# Patient Record
Sex: Female | Born: 1980
Health system: Southern US, Community
[De-identification: ages and names within clinical notes are randomized; demographics above are authoritative.]

## PROBLEM LIST (undated history)

## (undated) DIAGNOSIS — T7840XA Allergy, unspecified, initial encounter: Secondary | ICD-10-CM

## (undated) DIAGNOSIS — D649 Anemia, unspecified: Secondary | ICD-10-CM

## (undated) DIAGNOSIS — E079 Disorder of thyroid, unspecified: Secondary | ICD-10-CM

## (undated) HISTORY — PX: CHOLECYSTECTOMY: SHX55

## (undated) HISTORY — DX: Disorder of thyroid, unspecified: E07.9

## (undated) HISTORY — PX: COLPOSCOPY: SHX161

## (undated) HISTORY — DX: Allergy, unspecified, initial encounter: T78.40XA

## (undated) HISTORY — DX: Anemia, unspecified: D64.9

---

## 2010-01-07 ENCOUNTER — Emergency Department (HOSPITAL_COMMUNITY): Admission: EM | Admit: 2010-01-07 | Discharge: 2010-01-07 | Payer: Self-pay | Admitting: Emergency Medicine

## 2011-05-23 ENCOUNTER — Other Ambulatory Visit: Payer: Self-pay | Admitting: Family Medicine

## 2011-05-23 ENCOUNTER — Ambulatory Visit
Admission: RE | Admit: 2011-05-23 | Discharge: 2011-05-23 | Disposition: A | Discharge status: Home / Self Care | Payer: 59 | Source: Ambulatory Visit | Attending: Family Medicine | Admitting: Family Medicine

## 2011-05-23 DIAGNOSIS — R1011 Right upper quadrant pain: Secondary | ICD-10-CM

## 2011-12-03 ENCOUNTER — Ambulatory Visit (INDEPENDENT_AMBULATORY_CARE_PROVIDER_SITE_OTHER): Payer: 59

## 2011-12-03 DIAGNOSIS — J069 Acute upper respiratory infection, unspecified: Secondary | ICD-10-CM

## 2011-12-03 DIAGNOSIS — R5081 Fever presenting with conditions classified elsewhere: Secondary | ICD-10-CM

## 2011-12-03 DIAGNOSIS — R05 Cough: Secondary | ICD-10-CM

## 2011-12-03 DIAGNOSIS — R059 Cough, unspecified: Secondary | ICD-10-CM

## 2011-12-07 ENCOUNTER — Ambulatory Visit (INDEPENDENT_AMBULATORY_CARE_PROVIDER_SITE_OTHER): Payer: 59

## 2011-12-07 DIAGNOSIS — R05 Cough: Secondary | ICD-10-CM

## 2011-12-07 DIAGNOSIS — R059 Cough, unspecified: Secondary | ICD-10-CM

## 2011-12-07 DIAGNOSIS — J069 Acute upper respiratory infection, unspecified: Secondary | ICD-10-CM

## 2011-12-07 DIAGNOSIS — K089 Disorder of teeth and supporting structures, unspecified: Secondary | ICD-10-CM

## 2012-01-29 ENCOUNTER — Ambulatory Visit (INDEPENDENT_AMBULATORY_CARE_PROVIDER_SITE_OTHER): Payer: 59 | Admitting: Physician Assistant

## 2012-01-29 VITALS — BP 125/83 | HR 71 | Temp 97.9°F | Resp 16 | Ht 69.0 in | Wt 320.8 lb

## 2012-01-29 DIAGNOSIS — R1084 Generalized abdominal pain: Secondary | ICD-10-CM

## 2012-01-29 DIAGNOSIS — R1013 Epigastric pain: Secondary | ICD-10-CM

## 2012-01-29 LAB — COMPREHENSIVE METABOLIC PANEL
ALT: 14 U/L (ref 0–35)
Albumin: 4 g/dL (ref 3.5–5.2)
CO2: 25 mEq/L (ref 19–32)
Calcium: 9.2 mg/dL (ref 8.4–10.5)
Chloride: 106 mEq/L (ref 96–112)
Creat: 0.76 mg/dL (ref 0.50–1.10)
Potassium: 4.5 mEq/L (ref 3.5–5.3)
Sodium: 138 mEq/L (ref 135–145)
Total Protein: 6.7 g/dL (ref 6.0–8.3)

## 2012-01-29 LAB — POCT CBC
Granulocyte percent: 68.7 %G (ref 37–80)
Hemoglobin: 13.3 g/dL (ref 12.2–16.2)
MCH, POC: 28.4 pg (ref 27–31.2)
MPV: 10.2 fL (ref 0–99.8)
POC MID %: 6 %M (ref 0–12)
RBC: 4.69 M/uL (ref 4.04–5.48)
WBC: 8.2 10*3/uL (ref 4.6–10.2)

## 2012-01-29 MED ORDER — OMEPRAZOLE 20 MG PO CPDR: 20.0000 mg | DELAYED_RELEASE_CAPSULE | Freq: Every day | ORAL | Status: DC

## 2012-01-29 NOTE — Progress Notes (Signed)
  Subjective:    Patient ID: Cassandra Levy, female    DOB: 1981/09/23, 30 y.o.   MRN: [id number]  HPI Cassandra Levy presents today c/o waking at 3 am with epigastric pain and burning.  Pain relieved slightly with lying on stomach and taking Pepto. No radiating pain. Some belching. No n/v/d. Last BM was 4 pm yesterday and was normal. Has had similar episodes in past that were mild.  Abdominal US 6/12 for RUQ pain.  Fatty liver and no stones.  LFTs were normal then.  Pain seems to be gone at the moment but she feels that it would return if she ate something. Did have fatty meal last pm and needed Pepto after eating for heartburn symptoms.  This is unusual for pt. Denies UTI sx.  LMP 01/28/12  Hypothyroidism Non smoker  Review of Systems  Constitutional: Negative for fever, chills and appetite change.  Respiratory: Negative for cough, chest tightness and shortness of breath.   Cardiovascular: Negative for chest pain and palpitations.  Gastrointestinal: Positive for abdominal pain. Negative for nausea, vomiting, diarrhea and constipation.  Genitourinary: Negative for dysuria, frequency, hematuria and flank pain.  Neurological: Negative for headaches.       Objective:   Physical Exam  Cardiovascular: Normal rate and regular rhythm.   Pulmonary/Chest: Effort normal and breath sounds normal.  Abdominal: Soft. Normal appearance. There is tenderness (mild tenderness epigastric area. ) in the epigastric area. There is CVA tenderness and positive Murphy's sign. There is no rebound, no guarding and no tenderness at McBurney's point.   Results for orders placed in visit on 01/29/12  POCT CBC      Component Value Range   WBC 8.2  4.6 - 10.2 (K/uL)   Lymph, poc 2.1  0.6 - 3.4    POC LYMPH PERCENT 25.3  10 - 50 (%L)   MID (cbc) 0.5  0 - 0.9    POC MID % 6.0  0 - 12 (%M)   POC Granulocyte 5.6  2 - 6.9    Granulocyte percent 68.7  37 - 80 (%G)   RBC 4.69  4.04 - 5.48 (M/uL)   Hemoglobin 13.3  12.2 -  16.2 (g/dL)   HCT, POC 40.9  81.1 - 47.9 (%)   MCV 89.2  80 - 97 (fL)   MCH, POC 28.4  27 - 31.2 (pg)   MCHC 31.8  31.8 - 35.4 (g/dL)   RDW, POC 91.4     Platelet Count, POC 274  142 - 424 (K/uL)   MPV 10.2  0 - 99.8 (fL)          Assessment & Plan:  Epigastric pain ?GERD vs. GB  Check CMET Omerprazole 20 mg ordered qd Bland diet. Avoid fatty food. Return if symptoms worsen.

## 2012-03-30 ENCOUNTER — Other Ambulatory Visit: Payer: Self-pay | Admitting: Family Medicine

## 2012-03-30 NOTE — Telephone Encounter (Signed)
Needs ov

## 2012-05-15 ENCOUNTER — Other Ambulatory Visit: Payer: Self-pay | Admitting: Physician Assistant

## 2012-06-27 ENCOUNTER — Ambulatory Visit (INDEPENDENT_AMBULATORY_CARE_PROVIDER_SITE_OTHER): Payer: 59 | Admitting: Family Medicine

## 2012-06-27 ENCOUNTER — Ambulatory Visit: Payer: 59

## 2012-06-27 VITALS — BP 150/82 | HR 92 | Temp 97.8°F | Resp 18 | Ht 69.0 in | Wt 320.0 lb

## 2012-06-27 DIAGNOSIS — R109 Unspecified abdominal pain: Secondary | ICD-10-CM

## 2012-06-27 DIAGNOSIS — E039 Hypothyroidism, unspecified: Secondary | ICD-10-CM

## 2012-06-27 DIAGNOSIS — N760 Acute vaginitis: Secondary | ICD-10-CM

## 2012-06-27 DIAGNOSIS — E049 Nontoxic goiter, unspecified: Secondary | ICD-10-CM

## 2012-06-27 DIAGNOSIS — K5792 Diverticulitis of intestine, part unspecified, without perforation or abscess without bleeding: Secondary | ICD-10-CM

## 2012-06-27 DIAGNOSIS — K5900 Constipation, unspecified: Secondary | ICD-10-CM | POA: Insufficient documentation

## 2012-06-27 LAB — POCT WET PREP WITH KOH
KOH Prep POC: POSITIVE
Trichomonas, UA: NEGATIVE
Yeast Wet Prep HPF POC: NEGATIVE

## 2012-06-27 LAB — POCT CBC
Granulocyte percent: 73.5 %G (ref 37–80)
Hemoglobin: 13.6 g/dL (ref 12.2–16.2)
MCH, POC: 28.3 pg (ref 27–31.2)
MCV: 92.8 fL (ref 80–97)
RBC: 4.8 M/uL (ref 4.04–5.48)
WBC: 12.8 10*3/uL — AB (ref 4.6–10.2)

## 2012-06-27 LAB — POCT UA - MICROSCOPIC ONLY: Crystals, Ur, HPF, POC: NEGATIVE

## 2012-06-27 LAB — POCT URINALYSIS DIPSTICK
Bilirubin, UA: NEGATIVE
Blood, UA: NEGATIVE
Glucose, UA: NEGATIVE
Nitrite, UA: NEGATIVE
Spec Grav, UA: 1.015

## 2012-06-27 MED ORDER — CIPROFLOXACIN HCL 500 MG PO TABS: 500.0000 mg | ORAL_TABLET | Freq: Two times a day (BID) | ORAL | Status: AC

## 2012-06-27 MED ORDER — METRONIDAZOLE 500 MG PO TABS: 500.0000 mg | ORAL_TABLET | Freq: Two times a day (BID) | ORAL | Status: AC

## 2012-06-27 MED ORDER — METRONIDAZOLE 500 MG PO TABS: 500.0000 mg | ORAL_TABLET | Freq: Three times a day (TID) | ORAL | Status: DC

## 2012-06-27 MED ORDER — LEVOTHYROXINE SODIUM 50 MCG PO TABS: 50.0000 ug | ORAL_TABLET | Freq: Every day | ORAL | Status: DC

## 2012-06-27 NOTE — Progress Notes (Signed)
Subjective: 31 year old lady with history of vaginal discharge off and on. He's been having been under lately. Some itching. She is planning on working harder on losing weight to the gym. She says that she has a family history of legs have a lot of fluid in them.  she is hypothyroid and needs her thyroid rechecked. It has been 3 years since she's had a Pap smear. Will recheck it today.She her husband have not had intercourse since May. Her last missed period was the end of June. It was elected usual.Last night she had sharp left lower quadrant abdominal pain. Again she had this morning and is persistently tender in the left lower quadrant. General bowel movement today. She usually has normal bowel movements.  Dr. Patsy Lager is her PCP.  Objective: Obese white female in no acute distress. Neck supple. Modest thyromegaly. It is about 2-3 times normal in size. Chest is clear. Heart regular without murmurs. And soft without mass or tenderness except for some tenderness in the left lower quadrant. Extremities without edema. Pelvic normal external genitalia. Vaginal mucosa unremarkable. Cervix benign. It is a little hard to get a good view of the source because of her vagina is so deep. Was taken, partially blindly. Bimanual exam shows no adnexal uterine masses  Results for orders placed in visit on 06/27/12  POCT CBC      Component Value Range   WBC 12.8 (*) 4.6 - 10.2 K/uL   Lymph, poc 2.6  0.6 - 3.4   POC LYMPH PERCENT 20.3  10 - 50 %L   MID (cbc) 0.8  0 - 0.9   POC MID % 6.2  0 - 12 %M   POC Granulocyte 9.4 (*) 2 - 6.9   Granulocyte percent 73.5  37 - 80 %G   RBC 4.80  4.04 - 5.48 M/uL   Hemoglobin 13.6  12.2 - 16.2 g/dL   HCT, POC 40.9  81.1 - 47.9 %   MCV 92.8  80 - 97 fL   MCH, POC 28.3  27 - 31.2 pg   MCHC 30.6 (*) 31.8 - 35.4 g/dL   RDW, POC 91.4     Platelet Count, POC 336  142 - 424 K/uL   MPV 9.2  0 - 99.8 fL  POCT URINALYSIS DIPSTICK      Component Value Range   Color, UA yellow     Clarity, UA cloudy     Glucose, UA negative     Bilirubin, UA negative     Ketones, UA negative     Spec Grav, UA 1.015     Blood, UA negative     pH, UA 8.5     Protein, UA negative     Urobilinogen, UA 0.2     Nitrite, UA negative     Leukocytes, UA Trace    POCT UA - MICROSCOPIC ONLY      Component Value Range   WBC, Ur, HPF, POC 0-2     RBC, urine, microscopic 0-1     Bacteria, U Microscopic trace     Mucus, UA negative     Epithelial cells, urine per micros 3-6     Crystals, Ur, HPF, POC negative     Casts, Ur, LPF, POC negative     Yeast, UA negative     Amorphous positive    POCT WET PREP WITH KOH      Component Value Range   Trichomonas, UA Negative     Clue Cells Wet Prep  HPF POC 0-1     Epithelial Wet Prep HPF POC 2-5     Yeast Wet Prep HPF POC negative     Bacteria Wet Prep HPF POC 2+     RBC Wet Prep HPF POC negative     WBC Wet Prep HPF POC 0-1     KOH Prep POC Positive     [facility] reading (PRIMARY) by  Dr. Alwyn Ren Nonspecific abdominal series.    Assessment:  LLQ abdominal pain Diverticulitis Nonspecific vaginitis Constipation Hypothyroid, goiter.  Plan Cipro Flagyl Await thyroid results.  Discussed the vaginitis with her The Flagyl should cover both the vaginitis (if indeed she has any be the even though it didn't look like it.) And the possibility of diverticulitis. If: Gets worse she'll need to be sent for a colonoscopy. Diverticulitis was explained to her  Go to the emergency room if abruptly worse.

## 2012-06-27 NOTE — Patient Instructions (Signed)
High fiber diet. Weight loss as discussed Exercise Return if abdominal pain persists

## 2012-06-28 ENCOUNTER — Telehealth: Payer: Self-pay | Admitting: Radiology

## 2012-06-28 NOTE — Telephone Encounter (Signed)
Husband here for visit with Dr Cleta Alberts and is asking about medication warning. There is a warning on bottle of Cipro and Advil/ Ibuprofen. States not to use together. She takes Advil for headaches, states no relief with Tylenol. Please Advise if anything can be substituted for either of these medications. Using [facility] . pls call phone # cell [phone]. Aware it will be tomorrow before anyone is called with this information.

## 2012-06-30 NOTE — Telephone Encounter (Signed)
The warning is about seizures - so if the patient does not have a seziure d/o it should be ok because advil is probably not taken daily.  If patient is really concerned I would just use tylenol for HAs while on the medication.  There is not a different medication for her presumed diverticulitis that works as well as cipro.

## 2012-07-01 LAB — PAP IG (IMAGE GUIDED)

## 2012-07-01 NOTE — Telephone Encounter (Signed)
LMOM to call back

## 2012-07-02 ENCOUNTER — Telehealth: Payer: Self-pay | Admitting: Family Medicine

## 2012-07-02 ENCOUNTER — Encounter: Payer: Self-pay | Admitting: Family Medicine

## 2012-07-02 NOTE — Telephone Encounter (Signed)
Had to open encounter to print normal pap letter. Previous letter wrong format. Please disregard.

## 2012-07-05 NOTE — Telephone Encounter (Signed)
Patient notified, and has been using Tylenol.

## 2012-09-08 ENCOUNTER — Ambulatory Visit: Payer: 59

## 2012-09-08 ENCOUNTER — Ambulatory Visit (INDEPENDENT_AMBULATORY_CARE_PROVIDER_SITE_OTHER): Payer: 59 | Admitting: Family Medicine

## 2012-09-08 VITALS — BP 132/86 | HR 86 | Temp 97.6°F | Resp 18 | Ht 68.5 in | Wt 332.0 lb

## 2012-09-08 DIAGNOSIS — E669 Obesity, unspecified: Secondary | ICD-10-CM

## 2012-09-08 DIAGNOSIS — E039 Hypothyroidism, unspecified: Secondary | ICD-10-CM

## 2012-09-08 DIAGNOSIS — S60229A Contusion of unspecified hand, initial encounter: Secondary | ICD-10-CM

## 2012-09-08 NOTE — Progress Notes (Addendum)
[facility] [address], [place] [address] [phone]  Date:  09/08/2012   Name:  Cassandra Levy   DOB:  1981-01-26   MRN:  [id number]  PCP:  Tally Due, MD    Chief Complaint: Follow-up, Wrist Pain and consultation about weight   History of Present Illness:  Cassandra Levy is a 31 y.o. very pleasant female patient who presents with the following:  She was here in July and her TSH was noted to be 13.8.  She increased her levothyroxine from 50 to 100 mcg.  She has not noted any difference in how she feels.  She is trying to exercise more.  She walks some on the way to and from school, and gets to the gym about twice a week. She is doing cardio and some weights when she exercises at the gym. She is eating well- avoiding fast foods, more vegetables.    She has also noted a problem with her left hand.  She hit it on her metal desk at work last week and had a bruise.  Then last week she noted hand swelling and stiffness.  She can move the hand normally again, but has pain along the medial portion of her hand, especially is she flexes the hand or tries to press down on it.     Patient Active Problem List  Diagnosis  . Goiter  . Constipation  . Hypothyroid    Past Medical History  Diagnosis Date  . Thyroid disease     No past surgical history on file.  History  Substance Use Topics  . Smoking status: Never Smoker   . Smokeless tobacco: Not on file  . Alcohol Use: Not on file    No family history on file.  Allergies  Allergen Reactions  . Amoxicillin Swelling    Tightness in throat    Medication list has been reviewed and updated.  Current Outpatient Prescriptions on File Prior to Visit  Medication Sig Dispense Refill  . levothyroxine (SYNTHROID, LEVOTHROID) 50 MCG tablet Take 1 tablet (50 mcg total) by mouth daily.  30 tablet  5    Review of Systems:  As per HPI- otherwise negative.   Physical Examination: Filed Vitals:     09/08/12 1354  BP: 132/86  Pulse: 86  Temp: 97.6 F (36.4 C)  Resp: 18   Filed Vitals:   09/08/12 1354  Height: 5' 8.5" (1.74 m)  Weight: 332 lb (150.594 kg)   Body mass index is 49.75 kg/(m^2). Ideal Body Weight: Weight in (lb) to have BMI = 25: 166.5   GEN: WDWN, NAD, Non-toxic, A & O x 3, obese HEENT: Atraumatic, Normocephalic. Neck supple. No masses, No LAD. Ears and Nose: No external deformity. CV: RRR, No M/G/R. No JVD. No thrill. No extra heart sounds. PULM: CTA B, no wheezes, crackles, rhonchi. No retractions. No resp. distress. No accessory muscle use. EXTR: No c/c/e.  She has mild tenderness along her 2nd and 3rd MC, no swelling or redness, no bruising.  She has mild tenderness with flexion or extension of the wrist as well NEURO Normal gait.  PSYCH: Normally interactive. Conversant. Not depressed or anxious appearing.  Calm demeanor.   [facility] reading (PRIMARY) by  Dr. Patsy Lager.  Left hand: negative   Assessment and Plan: 1. Hypothyroid  TSH  2. Contusion, hand  DG Hand Complete Left  3. Obesity     Cassandra Levy is frustrated by her difficulties in losing weight.  She is doing well with her diet, but I did recommend that she step up her exercise program.  However, she is also aware that until her hypothyroidism is properly treated she will have a harder time losing weight.  I will contact her as soon as I get her TSH result- she will need her medication refilled at that time as well  It seems she has a contusion of her left hand.  She has a wrist splint at home that she plans to use- if her hand is not better after wearing her splint for a few days please give me a call or RTC.     Abbe Amsterdam, MD  Called her on 9/25 and left a detailed message- her TSH is still elevated  Results for orders placed in visit on 09/08/12  TSH      Component Value Range   TSH 6.497 (*) 0.350 - 4.500 uIU/mL   Will increase her synthroid to 125 mcg daily, and she will come in for a  lab draw only in 4 to 6 weeks.

## 2012-09-09 MED ORDER — LEVOTHYROXINE SODIUM 125 MCG PO TABS: 125.0000 ug | ORAL_TABLET | Freq: Every day | ORAL | Status: DC

## 2012-09-09 NOTE — Addendum Note (Signed)
Addended by: Abbe Amsterdam C on: 09/09/2012 04:53 PM   Modules accepted: Orders

## 2012-10-08 ENCOUNTER — Ambulatory Visit (INDEPENDENT_AMBULATORY_CARE_PROVIDER_SITE_OTHER): Payer: 59 | Admitting: Physician Assistant

## 2012-10-08 ENCOUNTER — Encounter: Payer: Self-pay | Admitting: Physician Assistant

## 2012-10-08 VITALS — BP 140/90 | HR 84 | Temp 98.1°F | Resp 16 | Ht 70.0 in | Wt 337.4 lb

## 2012-10-08 DIAGNOSIS — E669 Obesity, unspecified: Secondary | ICD-10-CM

## 2012-10-08 DIAGNOSIS — G4700 Insomnia, unspecified: Secondary | ICD-10-CM

## 2012-10-08 DIAGNOSIS — H6980 Other specified disorders of Eustachian tube, unspecified ear: Secondary | ICD-10-CM

## 2012-10-08 MED ORDER — MOMETASONE FUROATE 50 MCG/ACT NA SUSP: 2.0000 | Freq: Every day | NASAL | Status: DC

## 2012-10-08 MED ORDER — GUAIFENESIN ER 1200 MG PO TB12: 1.0000 | ORAL_TABLET | Freq: Two times a day (BID) | ORAL | Status: DC

## 2012-10-09 NOTE — Progress Notes (Signed)
   [address] [place] [address]   Phone [phone]  Subjective:    Patient ID: Cassandra Levy, female    DOB: May 24, 1981, 31 y.o.   MRN: [id number]  HPI Pt presents to clinic with L ear pain that started yesterday and is getting worse.  She has had some cold symptoms but they are much better.  The pain is worse with chewing and feels like it has to pop.  She has no hearing changes or tinnitus.  She did get some water in her ear last week and wonders if that has caused her problem.  She has used no meds.  She is also very frustrated with her weight loss efforts after she gained all her weight when her hypothyroidism was untreated.  She has lost some weight and now fees like she is gaining weight again. She is taking her thyroid medication daily and had plans to recheck next month.  She eats most days only twice a day, with breakfast as a yogurt and banana and lunch (if she eats it is salad or sandwich) and for dinner lean meat with veggies.  She drinks sweet tea and propel water, no sodas.  She tries to exercise, right now she is at 2x/wk and her goal is 5x/wk.   Review of Systems  Constitutional: Negative for fever and chills.  HENT: Positive for ear pain and congestion (minimal). Negative for hearing loss, neck stiffness and ear discharge.   Respiratory: Negative for cough.        Objective:   Physical Exam  Vitals reviewed. Constitutional: She is oriented to person, place, and time. She appears well-developed and well-nourished.  HENT:  Head: Normocephalic and atraumatic.  Right Ear: Hearing, tympanic membrane, external ear and ear canal normal.  Left Ear: Hearing, external ear and ear canal normal. Tympanic membrane is bulging (serous fluid).  Nose: Mucosal edema (pale) present.  Mouth/Throat: Uvula is midline.  Eyes: Conjunctivae normal are normal. Pupils are equal, round, and reactive to light.  Neck: Neck supple.  Cardiovascular: Normal rate, regular rhythm and normal  heart sounds.   Pulmonary/Chest: Effort normal and breath sounds normal.  Neurological: She is alert and oriented to person, place, and time.  Skin: Skin is warm and dry.  Psychiatric: She has a normal mood and affect. Her behavior is normal. Judgment and thought content normal.   Spent 30 mins counseling pt about her weight and weight loss strategies.       Assessment & Plan:   1. Obesity    2. ETD (eustachian tube dysfunction)  Guaifenesin (MUCINEX MAXIMUM STRENGTH) 1200 MG TB12, mometasone (NASONEX) 50 MCG/ACT nasal spray   Pts goal will be 1-2 lbs/ wk until Thanksgiving and then stay weight over the holidays and then she will continue her weight loss goal of 1-2lb/wk.  She will increase her physical activity throughout the day like go to the bathroom on a different floor.  She will keep small healthy snacks/meals at work for those days where she cannot pack her lunch.  She will add a protein to every meal/snack.  Will recheck next month for her thyroid.

## 2012-11-20 ENCOUNTER — Ambulatory Visit (INDEPENDENT_AMBULATORY_CARE_PROVIDER_SITE_OTHER): Payer: 59 | Admitting: Family Medicine

## 2012-11-20 VITALS — BP 142/90 | HR 76 | Temp 98.1°F | Resp 16 | Ht 68.0 in | Wt 334.0 lb

## 2012-11-20 DIAGNOSIS — E039 Hypothyroidism, unspecified: Secondary | ICD-10-CM

## 2012-11-20 DIAGNOSIS — N920 Excessive and frequent menstruation with regular cycle: Secondary | ICD-10-CM

## 2012-11-20 LAB — POCT CBC
Hemoglobin: 13.7 g/dL (ref 12.2–16.2)
Lymph, poc: 2.6 (ref 0.6–3.4)
MCH, POC: 28.4 pg (ref 27–31.2)
MCHC: 30.2 g/dL — AB (ref 31.8–35.4)
MID (cbc): 0.6 (ref 0–0.9)
MPV: 10 fL (ref 0–99.8)
POC Granulocyte: 6.1 (ref 2–6.9)
POC MID %: 6.7 %M (ref 0–12)
Platelet Count, POC: 373 10*3/uL (ref 142–424)
RDW, POC: 13.2 %
WBC: 9.3 10*3/uL (ref 4.6–10.2)

## 2012-11-20 NOTE — Progress Notes (Signed)
[facility] [address], [place] [address] [phone]  Date:  11/20/2012   Name:  Cassandra Levy   DOB:  [birth year]   MRN:  [id number]  PCP:  Tally Due, MD    Chief Complaint: Hypothyroidism   History of Present Illness:  Cassandra Levy is a 31 y.o. very pleasant female patient who presents with the following:  History of hypothyroidism.  We had to increase her dosage of synthroid as of late- in July her TSH was 13.8 and she increased her dose from 50 to 100 mcg. In September her TSH had come down to 6.4- we increased her dose to 125.  She started her new dose on 09/15/12.  She had a period 10/18- when it was expected- but she bled for 12 days.  She then started to bleed again on 11/12 and continues to bleed now.   She feels tired, is having fatigue and cramping.    Her menses have been regular in the past.  these menses are like a normal period.  She is not on OCP- she has tried these in the past but they did not agree with her- caused heart palpiations.    She had a pap over the summer- everything looked ok.  She is SA with her husband only   Patient Active Problem List  Diagnosis  . Goiter  . Constipation  . Hypothyroid  . Obesity    Past Medical History  Diagnosis Date  . Thyroid disease   . Allergy   . Anemia     Past Surgical History  Procedure Date  . Colposcopy     History  Substance Use Topics  . Smoking status: Never Smoker   . Smokeless tobacco: Not on file  . Alcohol Use: Not on file    No family history on file.  Allergies  Allergen Reactions  . Amoxicillin Swelling    Tightness in throat    Medication list has been reviewed and updated.  Current Outpatient Prescriptions on File Prior to Visit  Medication Sig Dispense Refill  . levothyroxine (SYNTHROID, LEVOTHROID) 125 MCG tablet Take 1 tablet (125 mcg total) by mouth daily.  30 tablet  5  . Guaifenesin (MUCINEX MAXIMUM STRENGTH) 1200 MG TB12 Take 1  tablet (1,200 mg total) by mouth 2 (two) times daily.  14 each  0  . mometasone (NASONEX) 50 MCG/ACT nasal spray Place 2 sprays into the nose daily.  17 g  12    Review of Systems:  As per HPI- otherwise negative.   Physical Examination: Filed Vitals:   11/20/12 1415  BP: 167/83  Pulse: 76  Temp: 98.1 F (36.7 C)  Resp: 16   Filed Vitals:   11/20/12 1415  Height: 5\' 8"  (1.727 m)  Weight: 334 lb (151.501 kg)   Body mass index is 50.78 kg/(m^2). Ideal Body Weight: Weight in (lb) to have BMI = 25: 164.1   GEN: WDWN, NAD, Non-toxic, A & O x 3, obese HEENT: Atraumatic, Normocephalic. Neck supple. No masses, No LAD. Ears and Nose: No external deformity. CV: RRR, No M/G/R. No JVD. No thrill. No extra heart sounds. PULM: CTA B, no wheezes, crackles, rhonchi. No retractions. No resp. distress. No accessory muscle use. ABD: S, ND, +BS. No rebound. No HSM.  Slight tenderness over her uterus EXTR: No c/c/e NEURO Normal gait.  PSYCH: Normally interactive. Conversant. Not depressed or anxious appearing.  Calm demeanor. GU:  Normal exam, blood in  vaginal vault, no CMT, no adnexal masses or tenderness   Results for orders placed in visit on 11/20/12  POCT URINE PREGNANCY      Component Value Range   Preg Test, Ur Negative    POCT CBC      Component Value Range   WBC 9.3  4.6 - 10.2 K/uL   Lymph, poc 2.6  0.6 - 3.4   POC LYMPH PERCENT 28.2  10 - 50 %L   MID (cbc) 0.6  0 - 0.9   POC MID % 6.7  0 - 12 %M   POC Granulocyte 6.1  2 - 6.9   Granulocyte percent 65.1  37 - 80 %G   RBC 4.83  4.04 - 5.48 M/uL   Hemoglobin 13.7  12.2 - 16.2 g/dL   HCT, POC 29.5  28.4 - 47.9 %   MCV 93.9  80 - 97 fL   MCH, POC 28.4  27 - 31.2 pg   MCHC 30.2 (*) 31.8 - 35.4 g/dL   RDW, POC 13.2     Platelet Count, POC 373  142 - 424 K/uL   MPV 10.0  0 - 99.8 fL     Assessment and Plan: 1. Hypothyroidism  TSH  2. Menorrhagia  POCT urine pregnancy, POCT CBC   Despite persistent menstrual bleeding  her hemoglobin is stable and normal. Suspect she is having anovulatory bleeding due perhaps to her weight or to her thyroid disorder.  Will have her try NSAIDS on a scheduled basis for a few days to try and stop her bleeding. Await her TSH as this may reveal the problem. Will follow- up with her as soon as this is available.     Abbe Amsterdam, MD

## 2012-11-20 NOTE — Patient Instructions (Addendum)
Try taking ibuprofen 400 mg three times a day for the next several days to try and stop your bleeding.    I will be in touch as soon as I get your thyroid test back

## 2012-11-21 LAB — TSH: TSH: 3.194 u[IU]/mL (ref 0.350–4.500)

## 2012-11-24 ENCOUNTER — Telehealth: Payer: Self-pay

## 2012-11-24 ENCOUNTER — Encounter: Payer: Self-pay | Admitting: Family Medicine

## 2012-11-24 NOTE — Telephone Encounter (Signed)
Spoke w/pt (see notes under lab results) and gave her TSH results and D/W pt message about NSAIDS and amount of menstrual flow. Pt stated that the NSAIDS have slowed her bleeding down to spotting. I advised pt to let us know if bleeding doesn't stop by Friday when she was instr'd to stop the NSAIDS. Pt decided to wait to see if bleeding stops and what happens at her next cycle to start referral. She agreed to keep [place] informed and CB if wants referral.

## 2012-11-24 NOTE — Telephone Encounter (Signed)
PT WAS RETURNING DR. Cyndie Chime CALL.  [phone]

## 2013-01-14 ENCOUNTER — Other Ambulatory Visit: Payer: Self-pay | Admitting: Radiology

## 2013-01-14 ENCOUNTER — Ambulatory Visit (INDEPENDENT_AMBULATORY_CARE_PROVIDER_SITE_OTHER): Payer: 59 | Admitting: Physician Assistant

## 2013-01-14 VITALS — BP 137/84 | HR 88 | Temp 98.1°F | Resp 18 | Ht 68.5 in | Wt 341.0 lb

## 2013-01-14 DIAGNOSIS — E039 Hypothyroidism, unspecified: Secondary | ICD-10-CM

## 2013-01-14 DIAGNOSIS — R102 Pelvic and perineal pain: Secondary | ICD-10-CM

## 2013-01-14 DIAGNOSIS — J069 Acute upper respiratory infection, unspecified: Secondary | ICD-10-CM

## 2013-01-14 DIAGNOSIS — N920 Excessive and frequent menstruation with regular cycle: Secondary | ICD-10-CM

## 2013-01-14 LAB — POCT CBC
Granulocyte percent: 69.2 %G (ref 37–80)
MID (cbc): 0.7 (ref 0–0.9)
POC Granulocyte: 6.1 (ref 2–6.9)
POC LYMPH PERCENT: 23.2 %L (ref 10–50)
POC MID %: 7.6 %M (ref 0–12)
Platelet Count, POC: 371 10*3/uL (ref 142–424)
RDW, POC: 13.7 %

## 2013-01-14 LAB — TSH: TSH: 3.483 u[IU]/mL (ref 0.350–4.500)

## 2013-01-14 MED ORDER — FLUTICASONE PROPIONATE 50 MCG/ACT NA SUSP: 2.0000 | Freq: Every day | NASAL | Status: DC

## 2013-01-14 MED ORDER — GUAIFENESIN ER 1200 MG PO TB12: 1.0000 | ORAL_TABLET | Freq: Two times a day (BID) | ORAL | Status: DC

## 2013-01-14 NOTE — Progress Notes (Signed)
[address], [place] [address]   Phone [phone]  Subjective:    Patient ID: Cassandra Levy, female    DOB: [birth year], 32 y.o.   MRN: [id number]  HPI Pt presents to clinic with 1 wk h/o cold symptoms.  She started last week with a scratchy throat and now has some sinus congestion with some rhinorrhea but no cough, fever or chills.  She used a couple of doses of OTC allergy med without much help, her main complaint is her sore throat.  She has had no exposures to strep.  She is also concerned about her regular but very long menses.  They are monthly but they last about 2 wks and are relatively heavy. She has always had regular menses and heavy but these are heavier and longer than her normal 7-9 day menses.  During the fall she tried several OCP but they made her heart race and she did not stay on them.  She is getting ready to start walking for weight loss.   Review of Systems  Constitutional: Negative for fever and chills.  HENT: Positive for congestion. Negative for ear pain (discomfort - feel full).   Respiratory: Negative for cough.   Genitourinary: Positive for menstrual problem.       Objective:   Physical Exam  Vitals reviewed. Constitutional: She is oriented to person, place, and time. She appears well-developed and well-nourished.  HENT:  Head: Normocephalic and atraumatic.  Right Ear: Hearing, tympanic membrane, external ear and ear canal normal.  Left Ear: Hearing, tympanic membrane, external ear and ear canal normal.  Nose: Mucosal edema (red and swollen - touching septum on the L side) present.  Mouth/Throat: Uvula is midline, oropharynx is clear and moist and mucous membranes are normal.  Eyes: Conjunctivae normal are normal.  Neck: Neck supple.  Cardiovascular: Normal rate, regular rhythm and normal heart sounds.   Pulmonary/Chest: Effort normal and breath sounds normal.  Neurological: She is alert and oriented to person, place, and time.  Skin: Skin is  warm and dry.  Psychiatric: She has a normal mood and affect. Her behavior is normal. Judgment and thought content normal.   Results for orders placed in visit on 01/14/13  POCT CBC      Component Value Range   WBC 8.8  4.6 - 10.2 K/uL   Lymph, poc 2.0  0.6 - 3.4   POC LYMPH PERCENT 213.2 (*) 10 - 50 %L   MID (cbc) 0.7  0 - 0.9   POC MID % 7.6  0 - 12 %M   POC Granulocyte 6.1  2 - 6.9   Granulocyte percent 69.2  37 - 80 %G   RBC 4.60  4.04 - 5.48 M/uL   Hemoglobin 13.7  12.2 - 16.2 g/dL   HCT, POC 46.9  62.9 - 47.9 %   MCV 90.0  80 - 97 fL   MCH, POC 29.8  27 - 31.2 pg   MCHC 33.1  31.8 - 35.4 g/dL   RDW, POC 52.8     Platelet Count, POC 371  142 - 424 K/uL   MPV 9.4  0 - 99.8 fL   POC lymph percent was entered incorrectly should be 21%     Assessment & Plan:   1. URI (upper respiratory infection)  fluticasone (FLONASE) 50 MCG/ACT nasal spray, Guaifenesin (MUCINEX MAXIMUM STRENGTH) 1200 MG TB12  2. Menorrhagia  POCT CBC, US Pelvis Complete  3. Hypothyroidism  TSH   1- symptomatic care -  if symptoms continue in 1 week please call because then I will be concerned about sinus infection.  Pt given flonase and she is to use when she gets cold or allergy symptoms to help decrease her chances of developing sinus infections and make her symptoms more tolerable. 2- menometrorrhagia - will order [place] to ensure that her endometrium is normal thickness - if normal may consider back on OCP to see if we can make menses shorter and lighter without increased HR side effects.   3- will check TSH today - d/w pt that she might want to consider brand name synthroid to reduce different amounts in different generic.  Answered patients questions.  She agrees with the above.

## 2013-01-15 ENCOUNTER — Ambulatory Visit
Admission: RE | Admit: 2013-01-15 | Discharge: 2013-01-15 | Disposition: A | Discharge status: Home / Self Care | Payer: 59 | Source: Ambulatory Visit | Attending: Physician Assistant | Admitting: Physician Assistant

## 2013-01-15 DIAGNOSIS — N920 Excessive and frequent menstruation with regular cycle: Secondary | ICD-10-CM

## 2013-01-15 DIAGNOSIS — R102 Pelvic and perineal pain: Secondary | ICD-10-CM

## 2013-01-18 ENCOUNTER — Telehealth: Payer: Self-pay

## 2013-01-18 DIAGNOSIS — R9389 Abnormal findings on diagnostic imaging of other specified body structures: Secondary | ICD-10-CM

## 2013-01-18 NOTE — Telephone Encounter (Signed)
Pt calling to see if u/s results were in please call her at [phone]

## 2013-01-19 NOTE — Telephone Encounter (Signed)
Advised pt of results.  Pt would like for [place] to refer her to a gyn.

## 2013-01-19 NOTE — Telephone Encounter (Signed)
Got the [place] back and it looks like she may have an abnormal area in her endometrium lining that may explain her irregular bleeding, because there is an ABNL area (even though it is probably benign) I think that it would be best for GYN to see her for the next step (watch or further testing)   Left message for her to call me back so I can advise.

## 2013-04-16 ENCOUNTER — Other Ambulatory Visit: Payer: Self-pay | Admitting: Family Medicine

## 2013-07-18 ENCOUNTER — Other Ambulatory Visit: Payer: Self-pay | Admitting: Physician Assistant

## 2013-08-04 LAB — HEPATIC FUNCTION PANEL: ALT: 23 U/L (ref 7–35)

## 2013-08-04 LAB — LIPID PANEL
Cholesterol: 178 mg/dL (ref 0–200)
HDL: 35 mg/dL (ref 35–70)

## 2013-08-04 LAB — BASIC METABOLIC PANEL: Creatinine: 0.8 mg/dL (ref <=1.1)

## 2013-08-11 ENCOUNTER — Ambulatory Visit (INDEPENDENT_AMBULATORY_CARE_PROVIDER_SITE_OTHER): Payer: 59 | Admitting: Family Medicine

## 2013-08-11 VITALS — BP 142/80 | HR 90 | Temp 98.8°F | Resp 18 | Ht 68.0 in | Wt 335.0 lb

## 2013-08-11 DIAGNOSIS — S335XXA Sprain of ligaments of lumbar spine, initial encounter: Secondary | ICD-10-CM

## 2013-08-11 DIAGNOSIS — S39012A Strain of muscle, fascia and tendon of lower back, initial encounter: Secondary | ICD-10-CM

## 2013-08-11 DIAGNOSIS — E039 Hypothyroidism, unspecified: Secondary | ICD-10-CM

## 2013-08-11 MED ORDER — LEVOTHYROXINE SODIUM 112 MCG PO TABS: ORAL_TABLET | ORAL | Status: DC

## 2013-08-11 NOTE — Progress Notes (Signed)
[facility] [address] [place] [address] [phone]  Date:  08/11/2013   Name:  Cassandra Levy   DOB:  12-10-1981   MRN:  [id number]  PCP:  Tally Due, MD    Chief Complaint: Back Pain and discuss labs   History of Present Illness:  Cassandra Levy is a 32 y.o. very pleasant female patient who presents with the following:  Here today to go over some labs that she had done through her job.  This BW included a thyroid panel.  She did change to name brand synthroid- at the same dosage- a few months ago. She notes that overall she feels great and has lost some weight.   Also, about one week ago she noted pain in her left lower back.  This seems to be improving with ice and ibuprofen.  Insidious onset of pain.  She had noted mild tenderness but did not think much of it, then "turned wrong"a couple of days ago and had more severe pain.  No fall or other trauma.   Pain does not radiate, no numbness or weakness in the leg, no bowel or bladder incontience.  She does not think it is serious but wanted it looked at as long as she is here.    Patient Active Problem List   Diagnosis Date Noted  . Obesity 10/08/2012  . Goiter 06/27/2012  . Constipation 06/27/2012  . Hypothyroid 06/27/2012    Past Medical History  Diagnosis Date  . Thyroid disease   . Allergy   . Anemia     Past Surgical History  Procedure Laterality Date  . Colposcopy      History  Substance Use Topics  . Smoking status: Never Smoker   . Smokeless tobacco: Not on file  . Alcohol Use: Not on file    History reviewed. No pertinent family history.  Allergies  Allergen Reactions  . Amoxicillin Swelling    Tightness in throat    Medication list has been reviewed and updated.  Current Outpatient Prescriptions on File Prior to Visit  Medication Sig Dispense Refill  . SYNTHROID 125 MCG tablet TAKE 1 TABLET (125 MCG TOTAL) BY MOUTH DAILY.  30 tablet  0   No current  facility-administered medications on file prior to visit.    Review of Systems:  As per HPI- otherwise negative. She feels that he is doing very well, has been able to lose some weight  Physical Examination: Filed Vitals:   08/11/13 1427  BP: 142/80  Pulse: 103  Temp: 98.8 F (37.1 C)  Resp: 18   Filed Vitals:   08/11/13 1427  Height: 5\' 8"  (1.727 m)  Weight: 335 lb (151.955 kg)   Body mass index is 50.95 kg/(m^2). Ideal Body Weight: Weight in (lb) to have BMI = 25: 164.1  GEN: WDWN, NAD, Non-toxic, A & O x 3, obese HEENT: Atraumatic, Normocephalic. Neck supple. No masses, No LAD. Ears and Nose: No external deformity. CV: RRR, No M/G/R. No JVD. No thrill. No extra heart sounds. PULM: CTA B, no wheezes, crackles, rhonchi. No retractions. No resp. distress. No accessory muscle use. EXTR: No c/c/e NEURO Normal gait.  PSYCH: Normally interactive. Conversant. Not depressed or anxious appearing.  Calm demeanor.  Back; she has minimal tenderness and spasm over the left lumbar muscles.  Normal flexion and extension, normal bilateral LE strength, sensation and DTR, negative SLR.  Notes that her BP looked "great" at her OBG earlier this  week.   Reviewed outside labs: TSH 0.006, T4 10.2 (normal), T3 30 (normal), free thyroxine 3.1 (normal)  Assessment and Plan:  Unspecified hypothyroidism - Plan: levothyroxine (SYNTHROID) 112 MCG tablet, TSH  Lumbar strain, initial encounter  Her TSH is suppressed, although other thyroid levels are normal.  Will slightly decrease her synthroid level and recheck in 1 month. Lumbar strain; reassurance, follow-up if not continuing to improve.     Signed Abbe Amsterdam, MD

## 2013-08-11 NOTE — Patient Instructions (Addendum)
We are going to change you to synthroid 112 mcg because your TSH is too low.   Please come in for a TSH check in about 1 month.

## 2013-08-25 ENCOUNTER — Encounter: Payer: Self-pay | Admitting: Family Medicine

## 2013-09-14 ENCOUNTER — Encounter: Payer: Self-pay | Admitting: Family Medicine

## 2013-10-21 ENCOUNTER — Encounter: Payer: Self-pay | Admitting: Family Medicine

## 2013-12-02 ENCOUNTER — Inpatient Hospital Stay (HOSPITAL_COMMUNITY)
Admission: EM | Admit: 2013-12-02 | Discharge: 2013-12-05 | DRG: 603 | Disposition: A | Discharge status: Home / Self Care | Payer: 59 | Attending: Internal Medicine | Admitting: Internal Medicine

## 2013-12-02 ENCOUNTER — Ambulatory Visit (INDEPENDENT_AMBULATORY_CARE_PROVIDER_SITE_OTHER): Payer: 59 | Admitting: Family Medicine

## 2013-12-02 ENCOUNTER — Encounter (HOSPITAL_COMMUNITY): Payer: Self-pay | Admitting: Emergency Medicine

## 2013-12-02 ENCOUNTER — Ambulatory Visit: Payer: 59

## 2013-12-02 VITALS — BP 150/75 | HR 90 | Temp 98.6°F | Resp 16 | Ht 67.69 in | Wt 336.8 lb

## 2013-12-02 DIAGNOSIS — E282 Polycystic ovarian syndrome: Secondary | ICD-10-CM | POA: Diagnosis present

## 2013-12-02 DIAGNOSIS — R059 Cough, unspecified: Secondary | ICD-10-CM

## 2013-12-02 DIAGNOSIS — L0390 Cellulitis, unspecified: Secondary | ICD-10-CM | POA: Diagnosis present

## 2013-12-02 DIAGNOSIS — E039 Hypothyroidism, unspecified: Secondary | ICD-10-CM

## 2013-12-02 DIAGNOSIS — K5900 Constipation, unspecified: Secondary | ICD-10-CM

## 2013-12-02 DIAGNOSIS — I872 Venous insufficiency (chronic) (peripheral): Secondary | ICD-10-CM | POA: Diagnosis present

## 2013-12-02 DIAGNOSIS — Z806 Family history of leukemia: Secondary | ICD-10-CM

## 2013-12-02 DIAGNOSIS — L738 Other specified follicular disorders: Secondary | ICD-10-CM | POA: Diagnosis present

## 2013-12-02 DIAGNOSIS — I498 Other specified cardiac arrhythmias: Secondary | ICD-10-CM | POA: Diagnosis present

## 2013-12-02 DIAGNOSIS — L03116 Cellulitis of left lower limb: Secondary | ICD-10-CM | POA: Diagnosis present

## 2013-12-02 DIAGNOSIS — R05 Cough: Secondary | ICD-10-CM

## 2013-12-02 DIAGNOSIS — E0580 Other thyrotoxicosis without thyrotoxic crisis or storm: Secondary | ICD-10-CM | POA: Diagnosis present

## 2013-12-02 DIAGNOSIS — L02419 Cutaneous abscess of limb, unspecified: Principal | ICD-10-CM | POA: Diagnosis present

## 2013-12-02 DIAGNOSIS — Z79899 Other long term (current) drug therapy: Secondary | ICD-10-CM

## 2013-12-02 DIAGNOSIS — Z823 Family history of stroke: Secondary | ICD-10-CM

## 2013-12-02 DIAGNOSIS — E669 Obesity, unspecified: Secondary | ICD-10-CM

## 2013-12-02 DIAGNOSIS — R600 Localized edema: Secondary | ICD-10-CM

## 2013-12-02 DIAGNOSIS — E049 Nontoxic goiter, unspecified: Secondary | ICD-10-CM

## 2013-12-02 LAB — POCT CBC
Granulocyte percent: 68.3 %G (ref 37–80)
HCT, POC: 40 % (ref 37.7–47.9)
Hemoglobin: 12.2 g/dL (ref 12.2–16.2)
MCH, POC: 27 pg (ref 27–31.2)
MCV: 88.5 fL (ref 80–97)
POC LYMPH PERCENT: 24.5 %L (ref 10–50)
RBC: 4.52 M/uL (ref 4.04–5.48)
RDW, POC: 14.8 %
WBC: 7.2 10*3/uL (ref 4.6–10.2)

## 2013-12-02 LAB — D-DIMER, QUANTITATIVE (NOT AT ARMC): D-Dimer, Quant: 0.79 ug/mL-FEU — ABNORMAL HIGH (ref 0.00–0.48)

## 2013-12-02 MED ORDER — DOXYCYCLINE HYCLATE 100 MG PO TABS: 100.0000 mg | ORAL_TABLET | Freq: Two times a day (BID) | ORAL | Status: DC

## 2013-12-02 NOTE — Patient Instructions (Signed)
Start on the doxycycline tonight.  Please come and see Dr. Katrinka Blazing tomorrow for a recheck.   Elevate your legs and try and antihistamine for itching and discomfort.  I will let you know the results of your D dimer and other labs asap/

## 2013-12-02 NOTE — Progress Notes (Signed)
[facility] [address] [place] [address] [phone]  Date:  12/02/2013   Name:  Cassandra Levy   DOB:  11-09-1981   MRN:  [id number]  PCP:  Tally Due, MD    Chief Complaint: Cough, Follow-up and Leg Swelling   History of Present Illness:  Cassandra Levy is a 32 y.o. very pleasant female patient who presents with the following:  She is here today for a recheck.  Her father was recently dx with terminal cancer which is very difficult for their family.  Overall they have had a very stressful year. At her last visit we decreased her synthroid slightly due to suppressed TSH.  Will recheck this today.   She also has noted a cough for about 6 weeks- which occurs more in the morning and at night. It is dry.  It feels "like a deep chest cough."  She also notes that she cannot tolerate exercise as well.  She does not cough more with exercise but can feel like she cannot breathe as well as usual  She has not noted a fever or chills.    She also has noted some swelling of her bilateral legs. She started a new job and has to wear uniform pants and high boots which seem to exacerbate her sx.  She has noted these sx for a month and a half or so.  Both legs are effected- the left is worse.   Her legs have been red and sensitive for about 2 weeks.  However over the last couple of days her sx have become much worse and the left leg is now quite swollen, warm, red and tender.   She does note SOB only when she is active at work  LMP is current.    She is on metformin for PCOS but does not carry a dx of DM.    Patient Active Problem List   Diagnosis Date Noted  . Obesity 10/08/2012  . Goiter 06/27/2012  . Constipation 06/27/2012  . Hypothyroid 06/27/2012    Past Medical History  Diagnosis Date  . Thyroid disease   . Allergy   . Anemia     Past Surgical History  Procedure Laterality Date  . Colposcopy      History  Substance Use Topics   . Smoking status: Never Smoker   . Smokeless tobacco: Not on file  . Alcohol Use: Not on file    History reviewed. No pertinent family history.  Allergies  Allergen Reactions  . Amoxicillin Swelling    Tightness in throat    Medication list has been reviewed and updated.  Current Outpatient Prescriptions on File Prior to Visit  Medication Sig Dispense Refill  . levothyroxine (SYNTHROID) 112 MCG tablet TAKE 1 TABLET (112 MCG TOTAL) BY MOUTH DAILY.Pt needs synthroid brand please  30 tablet  6   No current facility-administered medications on file prior to visit.    Review of Systems:  As per HPI- otherwise negative.   Physical Examination: Filed Vitals:   12/02/13 1809  BP: 158/68  Pulse: 121  Temp: 98.6 F (37 C)  Resp: 16   Filed Vitals:   12/02/13 1809  Height: 5' 7.69" (1.719 m)  Weight: 336 lb 12.8 oz (152.771 kg)   Body mass index is 51.7 kg/(m^2). Ideal Body Weight: Weight in (lb) to have BMI = 25: 162.6  GEN: WDWN, NAD, Non-toxic, A & O x 3, obese HEENT: Atraumatic, Normocephalic. Neck supple.  No masses, No LAD.  Bilateral TM wnl, oropharynx normal.  PEERL,EOMI.   Ears and Nose: No external deformity. CV: RRR, No M/G/R. No JVD. No thrill. No extra heart sounds. PULM: CTA B, no wheezes, crackles, rhonchi. No retractions. No resp. distress. No accessory muscle use. EXTR: No c/c/e NEURO Normal gait.  PSYCH: Normally interactive. Conversant. Not depressed or anxious appearing.  Calm demeanor.  LLE: she has impressive redness, warmth and swelling over the distal portion of her lower leg.  These findings are present around her circumferential leg.  The area is quite tender.  Measures 1.5 inches greater in circumference than the right LE RLE: milder sx than the left, but she does have a smaller area of redness, warmth and tenderness   Results for orders placed in visit on 12/02/13  POCT CBC      Result Value Range   WBC 7.2  4.6 - 10.2 K/uL   Lymph, poc 1.8   0.6 - 3.4   POC LYMPH PERCENT 24.5  10 - 50 %L   MID (cbc) 0.5  0 - 0.9   POC MID % 7.2  0 - 12 %M   POC Granulocyte 4.9  2 - 6.9   Granulocyte percent 68.3  37 - 80 %G   RBC 4.52  4.04 - 5.48 M/uL   Hemoglobin 12.2  12.2 - 16.2 g/dL   HCT, POC 82.9  56.2 - 47.9 %   MCV 88.5  80 - 97 fL   MCH, POC 27.0  27 - 31.2 pg   MCHC 30.5 (*) 31.8 - 35.4 g/dL   RDW, POC 13.0     Platelet Count, POC 320  142 - 424 K/uL   MPV 10.5  0 - 99.8 fL   [facility] reading (PRIMARY) by  Dr. Patsy Lager. CXR: negative  CHEST 2 VIEW  COMPARISON: Apr 21, 2009  FINDINGS: The lungs are clear. Heart size and pulmonary vascularity are normal. No adenopathy. No bone lesions.  IMPRESSION: No abnormality noted.  Assessment and Plan: Leg edema, left - Plan: D-dimer, quantitative, Brain natriuretic peptide, Basic metabolic panel  Cough - Plan: DG Chest 2 View  Hypothyroidism - Plan: TSH  Cellulitis of left leg - Plan: POCT CBC, doxycycline (VIBRA-TABS) 100 MG tablet  Cassandra Levy is here today with cough, SOB, tachycardia on first exam that resolved on repeat exam and cellulitis of the LE, left more than right.  She is allergic to amoxacillin so we cannot give her rocephin.  Will start coverage with doxycycline.  It seems that the tight pants and boots that she wears at her job may have triggered the apparent cellulitis in her leg.  Gave a work note for her to be allowed to wear her regular clothes at least until she is better.   Will do BNP due to SOB and leg swelling, but CHF less likely given her age. Given her complaint of leg swelling, decreased exercise tolerance and tachycardia on initial exam will also check a D dimer. She understands that this test can rule out but cannot rule in a DVT or PE.   Discussed admission for possible IV abx.  At this time Cassandra Levy would like to try outpt treatment.  Plan recheck tomorrow at clinic assuming D dimer negative.    Signed Abbe Amsterdam, MD  Received D dimer call from  [facility]- positive.  Called and discussed with Cassandra Levy and her MIL.  Options are ED evaluation now vs doppler and recheck in the am.  She  is understandably concerned and would prefer to be evaluated further now.  Called and briefly discussed with EDP.  Certainly if the EDP feels it is appropriate a dose of IV vancomycin may also be beneficial.

## 2013-12-02 NOTE — ED Notes (Signed)
Pt was dx this evening with dvt's by her primary Dr, she suggested she come here for IV antibiotics

## 2013-12-03 ENCOUNTER — Encounter (HOSPITAL_COMMUNITY): Payer: Self-pay | Admitting: Internal Medicine

## 2013-12-03 ENCOUNTER — Telehealth: Payer: Self-pay | Admitting: Family Medicine

## 2013-12-03 DIAGNOSIS — K5900 Constipation, unspecified: Secondary | ICD-10-CM

## 2013-12-03 DIAGNOSIS — R609 Edema, unspecified: Secondary | ICD-10-CM

## 2013-12-03 DIAGNOSIS — L03116 Cellulitis of left lower limb: Secondary | ICD-10-CM | POA: Diagnosis present

## 2013-12-03 DIAGNOSIS — R600 Localized edema: Secondary | ICD-10-CM

## 2013-12-03 DIAGNOSIS — L0390 Cellulitis, unspecified: Secondary | ICD-10-CM | POA: Diagnosis present

## 2013-12-03 DIAGNOSIS — E039 Hypothyroidism, unspecified: Secondary | ICD-10-CM

## 2013-12-03 DIAGNOSIS — I517 Cardiomegaly: Secondary | ICD-10-CM

## 2013-12-03 DIAGNOSIS — L02419 Cutaneous abscess of limb, unspecified: Principal | ICD-10-CM

## 2013-12-03 LAB — BASIC METABOLIC PANEL
BUN: 15 mg/dL (ref 6–23)
CO2: 22 mEq/L (ref 19–32)
CO2: 25 mEq/L (ref 19–32)
Calcium: 9.5 mg/dL (ref 8.4–10.5)
Calcium: 9.5 mg/dL (ref 8.4–10.5)
Chloride: 101 mEq/L (ref 96–112)
Chloride: 104 mEq/L (ref 96–112)
Creatinine, Ser: 0.57 mg/dL (ref 0.50–1.10)
Glucose, Bld: 83 mg/dL (ref 70–99)
Potassium: 4.7 mEq/L (ref 3.5–5.3)
Sodium: 139 mEq/L (ref 135–145)

## 2013-12-03 LAB — CBC WITH DIFFERENTIAL/PLATELET
Basophils Absolute: 0 10*3/uL (ref 0.0–0.1)
Basophils Relative: 0 % (ref 0–1)
Eosinophils Absolute: 0.5 10*3/uL (ref 0.0–0.7)
Eosinophils Relative: 5 % (ref 0–5)
Eosinophils Relative: 6 % — ABNORMAL HIGH (ref 0–5)
HCT: 37.2 % (ref 36.0–46.0)
HCT: 38.8 % (ref 36.0–46.0)
Hemoglobin: 12.2 g/dL (ref 12.0–15.0)
Hemoglobin: 13.1 g/dL (ref 12.0–15.0)
Lymphocytes Relative: 24 % (ref 12–46)
Lymphs Abs: 2.2 10*3/uL (ref 0.7–4.0)
MCH: 27.2 pg (ref 26.0–34.0)
MCH: 27.9 pg (ref 26.0–34.0)
MCHC: 32.8 g/dL (ref 30.0–36.0)
MCV: 82.9 fL (ref 78.0–100.0)
Monocytes Absolute: 0.8 10*3/uL (ref 0.1–1.0)
Monocytes Relative: 9 % (ref 3–12)
Monocytes Relative: 9 % (ref 3–12)
Neutro Abs: 4.4 10*3/uL (ref 1.7–7.7)
Neutro Abs: 5.7 10*3/uL (ref 1.7–7.7)
Neutrophils Relative %: 55 % (ref 43–77)
WBC: 7.9 10*3/uL (ref 4.0–10.5)
WBC: 9.2 10*3/uL (ref 4.0–10.5)

## 2013-12-03 LAB — URINALYSIS, ROUTINE W REFLEX MICROSCOPIC
Bilirubin Urine: NEGATIVE
Glucose, UA: NEGATIVE mg/dL
Ketones, ur: NEGATIVE mg/dL
Leukocytes, UA: NEGATIVE
Nitrite: NEGATIVE
Protein, ur: NEGATIVE mg/dL
Specific Gravity, Urine: 1.009 (ref 1.005–1.030)
Urobilinogen, UA: 0.2 mg/dL (ref 0.0–1.0)
pH: 6 (ref 5.0–8.0)

## 2013-12-03 LAB — COMPREHENSIVE METABOLIC PANEL
ALT: 20 U/L (ref 0–35)
AST: 16 U/L (ref 0–37)
BUN: 13 mg/dL (ref 6–23)
CO2: 24 mEq/L (ref 19–32)
Calcium: 9 mg/dL (ref 8.4–10.5)
Creatinine, Ser: 0.65 mg/dL (ref 0.50–1.10)
GFR calc Af Amer: >90 mL/min (ref >90)
GFR calc non Af Amer: >90 mL/min (ref >90)
Glucose, Bld: 83 mg/dL (ref 70–99)
Sodium: 136 mEq/L (ref 135–145)
Total Bilirubin: 0.7 mg/dL (ref 0.3–1.2)

## 2013-12-03 LAB — PREGNANCY, URINE: Preg Test, Ur: NEGATIVE

## 2013-12-03 LAB — PROTIME-INR
INR: 1.09 (ref 0.00–1.49)
Prothrombin Time: 13.9 s (ref 11.6–15.2)

## 2013-12-03 LAB — TSH: TSH: <0.008 u[IU]/mL — ABNORMAL LOW (ref 0.350–4.500)

## 2013-12-03 LAB — APTT: aPTT: 39 seconds — ABNORMAL HIGH (ref 24–37)

## 2013-12-03 LAB — BRAIN NATRIURETIC PEPTIDE: Brain Natriuretic Peptide: 20.3 pg/mL (ref 0.0–100.0)

## 2013-12-03 LAB — GLUCOSE, CAPILLARY: Glucose-Capillary: 76 mg/dL (ref 70–99)

## 2013-12-03 MED ORDER — SODIUM CHLORIDE 0.9 % IV SOLN
INTRAVENOUS | Status: AC
  Administered 2013-12-03: 75 mL/h via INTRAVENOUS

## 2013-12-03 MED ORDER — ENOXAPARIN SODIUM 80 MG/0.8ML ~~LOC~~ SOLN
75.0000 mg | SUBCUTANEOUS | Status: DC
  Administered 2013-12-04: 75 mg via SUBCUTANEOUS
  Filled 2013-12-03 (×2): qty 0.8

## 2013-12-03 MED ORDER — ENOXAPARIN SODIUM 60 MG/0.6ML ~~LOC~~ SOLN
60.0000 mg | Freq: Once | SUBCUTANEOUS | Status: AC
  Administered 2013-12-03: 60 mg via SUBCUTANEOUS
  Filled 2013-12-03: qty 0.6

## 2013-12-03 MED ORDER — ENOXAPARIN SODIUM 150 MG/ML ~~LOC~~ SOLN
150.0000 mg | Freq: Two times a day (BID) | SUBCUTANEOUS | Status: DC
  Administered 2013-12-03: 150 mg via SUBCUTANEOUS
  Filled 2013-12-03 (×3): qty 1

## 2013-12-03 MED ORDER — ONDANSETRON HCL 4 MG PO TABS: 4.0000 mg | ORAL_TABLET | Freq: Four times a day (QID) | ORAL | Status: DC | PRN

## 2013-12-03 MED ORDER — OXYCODONE HCL 5 MG PO TABS
5.0000 mg | ORAL_TABLET | ORAL | Status: DC | PRN
  Administered 2013-12-03 – 2013-12-04 (×3): 5 mg via ORAL
  Filled 2013-12-03 (×3): qty 1

## 2013-12-03 MED ORDER — CAMPHOR-MENTHOL 0.5-0.5 % EX LOTN
TOPICAL_LOTION | CUTANEOUS | Status: DC | PRN
  Administered 2013-12-03: 1 via TOPICAL
  Filled 2013-12-03: qty 222

## 2013-12-03 MED ORDER — ONDANSETRON HCL 4 MG/2ML IJ SOLN: 4.0000 mg | Freq: Three times a day (TID) | INTRAMUSCULAR | Status: DC | PRN

## 2013-12-03 MED ORDER — SENNA 8.6 MG PO TABS: 2.0000 | ORAL_TABLET | Freq: Every evening | ORAL | Status: DC | PRN

## 2013-12-03 MED ORDER — VANCOMYCIN HCL 10 G IV SOLR
1500.0000 mg | Freq: Once | INTRAVENOUS | Status: AC
  Administered 2013-12-03: 1500 mg via INTRAVENOUS
  Filled 2013-12-03: qty 1500

## 2013-12-03 MED ORDER — ONDANSETRON HCL 4 MG/2ML IJ SOLN: 4.0000 mg | Freq: Four times a day (QID) | INTRAMUSCULAR | Status: DC | PRN

## 2013-12-03 MED ORDER — VANCOMYCIN HCL 10 G IV SOLR
1500.0000 mg | Freq: Two times a day (BID) | INTRAVENOUS | Status: DC
  Administered 2013-12-03 – 2013-12-04 (×2): 1500 mg via INTRAVENOUS
  Filled 2013-12-03 (×4): qty 1500

## 2013-12-03 MED ORDER — DOCUSATE SODIUM 100 MG PO CAPS
100.0000 mg | ORAL_CAPSULE | Freq: Two times a day (BID) | ORAL | Status: DC
  Administered 2013-12-03 – 2013-12-05 (×5): 100 mg via ORAL
  Filled 2013-12-03 (×6): qty 1

## 2013-12-03 MED ORDER — ACETAMINOPHEN 650 MG RE SUPP: 650.0000 mg | Freq: Four times a day (QID) | RECTAL | Status: DC | PRN

## 2013-12-03 MED ORDER — SODIUM CHLORIDE 0.9 % IV SOLN
Freq: Once | INTRAVENOUS | Status: AC
  Administered 2013-12-03: via INTRAVENOUS

## 2013-12-03 MED ORDER — LEVOTHYROXINE SODIUM 112 MCG PO TABS
112.0000 ug | ORAL_TABLET | Freq: Every day | ORAL | Status: DC
  Administered 2013-12-03: 112 ug via ORAL
  Filled 2013-12-03 (×3): qty 1

## 2013-12-03 MED ORDER — LEVOTHYROXINE SODIUM 88 MCG PO TABS
88.0000 ug | ORAL_TABLET | Freq: Every day | ORAL | Status: DC
  Administered 2013-12-04 – 2013-12-05 (×2): 88 ug via ORAL
  Filled 2013-12-03 (×3): qty 1

## 2013-12-03 MED ORDER — VANCOMYCIN HCL 10 G IV SOLR: 1000.0000 mg | Freq: Once | INTRAVENOUS | Status: DC

## 2013-12-03 MED ORDER — VANCOMYCIN HCL IN DEXTROSE 1-5 GM/200ML-% IV SOLN
1000.0000 mg | Freq: Once | INTRAVENOUS | Status: AC
  Administered 2013-12-03: 1000 mg via INTRAVENOUS
  Filled 2013-12-03: qty 200

## 2013-12-03 MED ORDER — ACETAMINOPHEN 325 MG PO TABS: 650.0000 mg | ORAL_TABLET | Freq: Four times a day (QID) | ORAL | Status: DC | PRN

## 2013-12-03 NOTE — Progress Notes (Signed)
ANTIBIOTIC CONSULT NOTE - INITIAL  Pharmacy Consult for Vancomycin Indication: LE cellulitis  Allergies  Allergen Reactions  . Amoxicillin Swelling    Tightness in throat    Patient Measurements: Height: 5\' 8"  (172.7 cm) Weight: 336 lb 6.8 oz (152.6 kg) IBW/kg (Calculated) : 63.9   Vital Signs: Temp: 98.2 F (36.8 C) (12/19 0202) Temp src: Oral (12/19 0202) BP: 138/81 mmHg (12/19 0202) Pulse Rate: 110 (12/19 0202) Intake/Output from previous day:   Intake/Output from this shift:    Labs:  Recent Labs  12/02/13 1910 12/03/13 0017  WBC 7.2 9.2  HGB 12.2 13.1  PLT  --  339  CREATININE  --  0.57   Estimated Creatinine Clearance: 158.4 ml/min (by C-G formula based on Cr of 0.57). No results found for this basename: VANCOTROUGH, VANCOPEAK, VANCORANDOM, GENTTROUGH, GENTPEAK, GENTRANDOM, TOBRATROUGH, TOBRAPEAK, TOBRARND, AMIKACINPEAK, AMIKACINTROU, AMIKACIN,  in the last 72 hours   Microbiology: No results found for this or any previous visit (from the past 720 hour(s)).  Medical History: Past Medical History  Diagnosis Date  . Thyroid disease   . Allergy   . Anemia     Medications:  Scheduled:  . enoxaparin (LOVENOX) injection  150 mg Subcutaneous Q12H  . levothyroxine  112 mcg Oral QAC breakfast  . vancomycin  1,500 mg Intravenous Once  . vancomycin  1,500 mg Intravenous Q12H   Infusions:  . sodium chloride 75 mL/hr (12/03/13 0237)   Assessment: 32 yo with hx of hyperthyroidism, polycystic ovarian syndrome found to have elevated d-dimer @ urgent care.  Vancomycin for cellulitis per Rx.  Goal of Therapy:  Vancomycin trough level 10-15 mcg/ml  Plan:   Vancomycin 1Gm x1 then 1500mg  = 2500mg  load then 1500mg  IV q12h  F/U SCr/levels as needed  Susanne Greenhouse R 12/03/2013,3:36 AM

## 2013-12-03 NOTE — H&P (Signed)
[facility] History and Physical  Shauntavia Brackin Fontaine ZOX:[id number] DOB: 09/23/1981 DOA: 12/02/2013  Referring physician: ER physician. PCP: Tally Due, MD   Chief Complaint: Left leg pain and erythema.  HPI: Lavina Resor Mullinax is a 32 y.o. female history of hyperthyroidism and polycystic ovarian syndrome was advised to come to the ER because patient's d-dimer was found to be elevated. Patient has been experiencing swelling of her lower extremities for last few days after she had worn new boots at her workplace. Last 2 days she started developing some increased erythema on both lower extremities but more on the left. Yesterday she also developed pain which made her go to her primary care physician. At her primary care physician's office she had labs drawn which included d-dimer. Patient was discharge home on doxycycline for cellulitis of the left lower extremity. At that time patient did not want to get admitted. She was told oh the phone that her d-dimer was elevated and advised to come to the ER. At this time patient has been started IV vancomycin for cellulitis of the left lower extremity and admitted for further management. Patient states that off and on she does experience mild shortness of breath which is not new. Denies any chest pain nausea vomiting abdominal pain diarrhea fever chills.   Review of Systems: As presented in the history of presenting illness, rest negative.  Past Medical History  Diagnosis Date  . Thyroid disease   . Allergy   . Anemia    Past Surgical History  Procedure Laterality Date  . Colposcopy     Social History:  reports that she has never smoked. She does not have any smokeless tobacco history on file. She reports that she does not drink alcohol. Her drug history is not on file. Where does patient live home. Can patient participate in ADLs? Yes.  Allergies  Allergen Reactions  . Amoxicillin Swelling    Tightness in throat    Family  History:  Family History  Problem Relation Age of Onset  . Stroke Mother   . Leukemia Other       Prior to Admission medications   Medication Sig Start Date End Date Taking? Authorizing Provider  doxycycline (VIBRA-TABS) 100 MG tablet Take 1 tablet (100 mg total) by mouth 2 (two) times daily. 12/02/13  Yes Gwenlyn Found Copland, MD  ibuprofen (ADVIL,MOTRIN) 200 MG tablet Take 200 mg by mouth every 6 (six) hours as needed.   Yes Historical Provider, MD  levothyroxine (SYNTHROID) 112 MCG tablet TAKE 1 TABLET (112 MCG TOTAL) BY MOUTH DAILY.Pt needs synthroid brand please 08/11/13  Yes Gwenlyn Found Copland, MD  metFORMIN (GLUCOPHAGE) 500 MG tablet Take 500 mg by mouth 2 (two) times daily with a meal.    Yes Historical Provider, MD  norethindrone (AYGESTIN) 5 MG tablet Take 10 mg by mouth daily.    Yes Historical Provider, MD    Physical Exam: Filed Vitals:   12/02/13 2331 12/03/13 0202  BP: 153/85 138/81  Pulse: 107 110  Temp: 98 F (36.7 C) 98.2 F (36.8 C)  TempSrc: Oral Oral  Resp: 18 16  Height:  5\' 8"  (1.727 m)  Weight:  152.6 kg (336 lb 6.8 oz)  SpO2: 98% 95%     General:  Well-developed and nourished.  Eyes: Anicteric no pallor.  ENT: No discharge from ears eyes nose mouth.  Neck: No mass felt.  Cardiovascular: S1-S2 heard.  Respiratory: No rhonchi or crepitations.  Abdomen: Soft nontender bowel sounds present.  Skin: Erythema of the left lower extremity enveloping the whole left calf and anterior shin but there is no tenderness. There is also erythema of the posterior aspect of the right leg.   Musculoskeletal: Patient has good pulses in both extremities. Patient has good range of motion.  Psychiatric: Appears normal.  Neurologic: Alert awake oriented to time place and person. Moves all extremities.  Labs on Admission:  Basic Metabolic Panel:  Recent Labs Lab 12/03/13 0017  NA 133*  K 4.7  CL 101  CO2 22  GLUCOSE 83  BUN 15  CREATININE 0.57  CALCIUM 9.5    Liver Function Tests: No results found for this basename: AST, ALT, ALKPHOS, BILITOT, PROT, ALBUMIN,  in the last 168 hours No results found for this basename: LIPASE, AMYLASE,  in the last 168 hours No results found for this basename: AMMONIA,  in the last 168 hours CBC:  Recent Labs Lab 12/02/13 1910 12/03/13 0017  WBC 7.2 9.2  NEUTROABS  --  5.7  HGB 12.2 13.1  HCT 40.0 38.8  MCV 88.5 82.6  PLT  --  339   Cardiac Enzymes: No results found for this basename: CKTOTAL, CKMB, CKMBINDEX, TROPONINI,  in the last 168 hours  BNP (last 3 results) No results found for this basename: PROBNP,  in the last 8760 hours CBG: No results found for this basename: GLUCAP,  in the last 168 hours  Radiological Exams on Admission: Dg Chest 2 View  12/02/2013   CLINICAL DATA:  Lower extremity edema  EXAM: CHEST  2 VIEW  COMPARISON:  Apr 21, 2009  FINDINGS: The lungs are clear. Heart size and pulmonary vascularity are normal. No adenopathy. No bone lesions.  IMPRESSION: No abnormality noted.   Electronically Signed   By: Bretta Bang M.D.   On: 12/02/2013 20:12     Assessment/Plan Principal Problem:   Cellulitis of left lower extremity Active Problems:   Hypothyroid   Cellulitis   1. Left lower extremity cellulitis - continue with IV vancomycin. Since patient's swelling is concerning for DVT I have ordered Doppler lower extremity to check for DVT and also have placed on Lovenox full dose for DVT which should be converted to prophylactic dose if DVT is negative. 2. Hypothyroidism - continue Synthroid. Since patient mildly tachycardic check TSH. 3. History of polycystic ovary and syndrome - continue metformin as outpatient.  Have reviewed patient's old charts.  Code Status: Full code.  Family Communication: Patient's mother at the bedside.  Disposition Plan: Admit to inpatient.    Isabellarose Kope N. [facility] Pager [phone].  If 7PM-7AM, please contact  night-coverage [email or web address] Password [email or web address] 12/03/2013, 2:23 AM

## 2013-12-03 NOTE — Progress Notes (Addendum)
[facility] PROGRESS NOTE  Ambriella Kitt Formoso ZOX:[id number] DOB: 12/03/1981 DOA: 12/02/2013 PCP: Tally Due, MD  Assessment/Plan  Lower extremity cellulitis.  May have started with folliculitis from tight boots -  Continue vancomycin -  Add ceftriaxone if not improving -  Await duplex lower extremities:  neg -  d/c therapeutic lovenox  Lower extremity swelling is most likely due to venous stasis, but rule out nephrotic syndrome/heart failure -  Elevate legs -  UA without proteinuria -  ECHO unremarkable -  Consider myxedema despite synthroid?  Hypothyroidism, TSH very suppressed.  Was suppressed in August at which time her dose was decreased from 125 to .   -  Reduce synthroid to daily  -  Will need repeat TSH in 2-4 weeks.    Sinus tachycardia may be related to infection or anxiety.  Most likely secondary to iatrogenic hyperthyroidism.   Diet:  regular Access:  PIV IVF:  KVO Proph:  Lovenox  Code Status: full Family Communication: patient, her mother and husband Disposition Plan: pending improvement in cellulitis and results of duplex, likely tomorrow   Consultants:  none  Procedures:  Duplex neg  ECHO  Antibiotics:  Vancomycin 12/19 >>   HPI/Subjective:  Redness has spread outside the line somewhat, however, it is considerably less dark and painful than before.      Objective: Filed Vitals:   12/02/13 2331 12/03/13 0202  BP: 153/85 138/81  Pulse: 107 110  Temp: 98 F (36.7 C) 98.2 F (36.8 C)  TempSrc: Oral Oral  Resp: 18 16  Height:  5\' 8"  (1.727 m)  Weight:  152.6 kg (336 lb 6.8 oz)  SpO2: 98% 95%   No intake or output data in the 24 hours ending 12/03/13 0855 Filed Weights   12/03/13 0202  Weight: 152.6 kg (336 lb 6.8 oz)    Exam:   General:  Obese CF, No acute distress  HEENT:  NCAT, MMM  Cardiovascular:  Tachycardic, regular rhythm, nl S1, S2 no mrg, 2+ pulses, warm extremities  Respiratory:  CTAB, no  increased WOB  Abdomen:   NABS, soft, NT/ND  MSK:   Normal tone and bulk, 1+ bilateral LEE.  Erythema of the medial right calf hand sized, erythema of the anterior/medial/lateral left shin extending up to the knee and down to a line just about the ankle  Neuro:  Grossly intact  Data Reviewed: Basic Metabolic Panel:  Recent Labs Lab 12/03/13 0017 12/03/13 0455  NA 133* 136  K 4.7 3.6  CL 101 102  CO2 22 24  GLUCOSE 83 83  BUN 15 13  CREATININE 0.57 0.65  CALCIUM 9.5 9.0   Liver Function Tests:  Recent Labs Lab 12/03/13 0455  AST 16  ALT 20  ALKPHOS 81  BILITOT 0.7  PROT 6.4  ALBUMIN 3.1*   No results found for this basename: LIPASE, AMYLASE,  in the last 168 hours No results found for this basename: AMMONIA,  in the last 168 hours CBC:  Recent Labs Lab 12/03/13 0017 12/03/13 0455  WBC 9.2 7.9  NEUTROABS 5.7 4.4  HGB 13.1 12.2  HCT 38.8 37.2  MCV 82.6 82.9  PLT 339 308   Cardiac Enzymes: No results found for this basename: CKTOTAL, CKMB, CKMBINDEX, TROPONINI,  in the last 168 hours BNP (last 3 results) No results found for this basename: PROBNP,  in the last 8760 hours CBG:  Recent Labs Lab 12/03/13 0812  GLUCAP 76    No results found for  this or any previous visit (from the past 240 hour(s)).   Studies: Dg Chest 2 View  12/02/2013   CLINICAL DATA:  Lower extremity edema  EXAM: CHEST  2 VIEW  COMPARISON:  Apr 21, 2009  FINDINGS: The lungs are clear. Heart size and pulmonary vascularity are normal. No adenopathy. No bone lesions.  IMPRESSION: No abnormality noted.   Electronically Signed   By: Bretta Bang M.D.   On: 12/02/2013 20:12    Scheduled Meds: . enoxaparin (LOVENOX) injection  150 mg Subcutaneous Q12H  . levothyroxine  112 mcg Oral QAC breakfast  . vancomycin  1,500 mg Intravenous Q12H   Continuous Infusions: . sodium chloride 75 mL/hr (12/03/13 0237)    Principal Problem:   Cellulitis of left lower extremity Active  Problems:   Hypothyroid   Cellulitis    Time spent: 30 min    Kaylah Chiasson, [facility]  [facility] Pager [phone]. If 7PM-7AM, please contact night-coverage at [email or web address], password [facility] 12/03/2013, 8:55 AM  LOS: 1 day

## 2013-12-03 NOTE — Progress Notes (Signed)
ANTICOAGULATION CONSULT NOTE - Initial Consult  Pharmacy Consult for enoxaparin Indication: VTE prophylaxis  Allergies  Allergen Reactions  . Amoxicillin Swelling    Tightness in throat    Patient Measurements: Height: 5\' 8"  (172.7 cm) Weight: 336 lb 6.8 oz (152.6 kg) IBW/kg (Calculated) : 63.9  Labs:  Recent Labs  12/03/13 0017 12/03/13 0455  HGB 13.1 12.2  HCT 38.8 37.2  PLT 339 308  APTT  --  39*  LABPROT  --  13.9  INR  --  1.09  CREATININE 0.57 0.65    Estimated Creatinine Clearance: 158.4 ml/min (by C-G formula based on Cr of 0.65).   Medical History: Past Medical History  Diagnosis Date  . Thyroid disease   . Allergy   . Anemia     Assessment: 33 yoF admitted 12/19 with lower extremity swelling. Patient has been ruled out for DVT with negative LE dopplers. D-dimer only slightly elevated at 0.79. Pharmacy has been consulted to dose enoxaparin for VTE prophylaxis in this obese patient.  actual body weight = 152.6kg  SCr = 0.65  CrCl > 100 ml/min  Noted patient received 1 treatment dose (1mg /kg) enoxaparin dose of 150mg  SQ at 0311 on 12/19  Will give smaller dose this evening at bedtime since still inside 24h from last dose in order to avoid middle of the night or very early morning SQ administration   Goal of Therapy:  VTE prophylaxis Monitor platelets by anticoagulation protocol: Yes   Plan:  - enoxaparin 60mg  SQ x 1 tonight at bedtime - start enoxaparin 75 mg (0.5 mg/kg) q24h at 2000 on 12/20 - pharmacy will sign off, please call with questions  Thank you for the consult.  Tomi Bamberger, PharmD, BCPS Clinical Pharmacist Pager: [phone] Pharmacy: [phone] 12/03/2013 7:00 PM

## 2013-12-03 NOTE — Progress Notes (Signed)
Echo Lab  2D Echocardiogram completed.  Anden Bartolo L Ayanna Gheen, RDCS 12/03/2013 3:35 PM

## 2013-12-03 NOTE — Telephone Encounter (Signed)
I have called [facility] in an attempt to get her D dimer to show on Epic but they are having technical difficulties.  I was told verbally that it was elevated at 0.7

## 2013-12-03 NOTE — Progress Notes (Signed)
ANTICOAGULATION CONSULT NOTE - Initial Consult  Pharmacy Consult for Lovenox Indication: VTE treatment/R/O DVT  Allergies  Allergen Reactions  . Amoxicillin Swelling    Tightness in throat    Patient Measurements: Height: 5\' 8"  (172.7 cm) Weight: 336 lb 6.8 oz (152.6 kg) IBW/kg (Calculated) : 63.9   Vital Signs: Temp: 98.2 F (36.8 C) (12/19 0202) Temp src: Oral (12/19 0202) BP: 138/81 mmHg (12/19 0202) Pulse Rate: 110 (12/19 0202)  Labs:  Recent Labs  12/02/13 1910 12/03/13 0017  HGB 12.2 13.1  HCT 40.0 38.8  PLT  --  339  CREATININE  --  0.57    Estimated Creatinine Clearance: 158.4 ml/min (by C-G formula based on Cr of 0.57).   Medical History: Past Medical History  Diagnosis Date  . Thyroid disease   . Allergy   . Anemia     Medications:  Scheduled:  . enoxaparin (LOVENOX) injection  150 mg Subcutaneous Q12H  . levothyroxine  112 mcg Oral QAC breakfast  . vancomycin  1,500 mg Intravenous Once  . vancomycin  1,500 mg Intravenous Q12H   Infusions:  . sodium chloride 75 mL/hr (12/03/13 0237)    Assessment: 32 yo admitted with LE swelling- DVT vs. Cellulits.  Dopple in am to r/o DVT. Lovenox per Rx for now.  Goal of Therapy:  Anti-Xa level 0.6-1 units/ml 4hrs after LMWH dose given    Plan:   Lovenox 150mg  SQ q12h  F/u Scr/levels if continued  Susanne Greenhouse R 12/03/2013,3:39 AM

## 2013-12-03 NOTE — Progress Notes (Signed)
Bilateral lower extremity venous duplex:  No obvious evidence of DVT, superficial thrombosis, or Baker's Cyst.  Technically difficult study due to the patient's body habitus.   

## 2013-12-03 NOTE — ED Provider Notes (Signed)
CSN: [id number]     Arrival date & time 12/02/13  2314 History   First MD Initiated Contact with Patient 12/02/13 2344     Chief Complaint  Patient presents with  . Leg Swelling   (Consider location/radiation/quality/duration/timing/severity/associated sxs/prior Treatment) HPI Comments: 32 year old female with a history of polycystic ovarian syndrome as well as hypothyroidism who presents with a complaint of lower extremities swelling and redness. She noted that 3 months ago she changed positions at her job at work and was required to wearing tightfitting boots. Since that time she has had intermittent mild redness and swelling of her lower extremities above the ankle but this tended to resolve after she took off the boots for a couple of days. Over last 2 days she has noticed increased redness swelling and pain, this prompted a visit to the urgent care and family medical clinic at [facility] [address] they drew a red line around the rash and started her on doxycycline. They also drew blood work including a d-dimer and noted her d-dimer was slightly elevated. They told her to come to the hospital for further evaluation. The patient denies fevers chills nausea vomiting but says that her symptoms are progressive and nothing seems to make it better. She has increased tenderness with walking, wearing tight socks or shoes and palpation of the legs. She is not currently on prednisone, she is not immunosuppressed, she has no history of DVT though she does take norethindrone.  The history is provided by the patient.    Past Medical History  Diagnosis Date  . Thyroid disease   . Allergy   . Anemia    Past Surgical History  Procedure Laterality Date  . Colposcopy     History reviewed. No pertinent family history. History  Substance Use Topics  . Smoking status: Never Smoker   . Smokeless tobacco: Not on file  . Alcohol Use: No   OB History   Grav Para Term Preterm Abortions TAB SAB Ect Mult Living              Review of Systems  All other systems reviewed and are negative.    Allergies  Amoxicillin  Home Medications   Current Outpatient Rx  Name  Route  Sig  Dispense  Refill  . doxycycline (VIBRA-TABS) 100 MG tablet   Oral   Take 1 tablet (100 mg total) by mouth 2 (two) times daily.   20 tablet   0   . ibuprofen (ADVIL,MOTRIN) 200 MG tablet   Oral   Take 200 mg by mouth every 6 (six) hours as needed.         Marland Kitchen levothyroxine (SYNTHROID) 112 MCG tablet      TAKE 1 TABLET (112 MCG TOTAL) BY MOUTH DAILY.Pt needs synthroid brand please   30 tablet   6     Needs appt for further refills   . metFORMIN (GLUCOPHAGE) 500 MG tablet   Oral   Take 500 mg by mouth 2 (two) times daily with a meal.          . norethindrone (AYGESTIN) 5 MG tablet   Oral   Take 10 mg by mouth daily.           BP 153/85  Pulse 107  Temp(Src) 98 F (36.7 C) (Oral)  Resp 18  SpO2 98%  LMP 12/02/2013 Physical Exam  Nursing note and vitals reviewed. Constitutional: She appears well-developed and well-nourished. No distress.  HENT:  Head: Normocephalic and atraumatic.  Mouth/Throat: Oropharynx  is clear and moist. No oropharyngeal exudate.  Eyes: Conjunctivae and EOM are normal. Pupils are equal, round, and reactive to light. Right eye exhibits no discharge. Left eye exhibits no discharge. No scleral icterus.  Neck: Normal range of motion. Neck supple. No JVD present. No thyromegaly present.  Cardiovascular: Normal rate, regular rhythm, normal heart sounds and intact distal pulses.  Exam reveals no gallop and no friction rub.   No murmur heard. Pulmonary/Chest: Effort normal and breath sounds normal. No respiratory distress. She has no wheezes. She has no rales.  Abdominal: Soft. Bowel sounds are normal. She exhibits no distension and no mass. There is no tenderness.  Musculoskeletal: Normal range of motion. She exhibits edema and tenderness.  Lymphadenopathy:    She has no cervical  adenopathy.  Neurological: She is alert. Coordination normal.  Skin: Skin is warm and dry. Rash noted. There is erythema.  Psychiatric: She has a normal mood and affect. Her behavior is normal.    ED Course  Procedures (including critical care time) Labs Review Labs Reviewed  CBC WITH DIFFERENTIAL - Abnormal; Notable for the following:    Eosinophils Relative 6 (*)    All other components within normal limits  BASIC METABOLIC PANEL - Abnormal; Notable for the following:    Sodium 133 (*)    All other components within normal limits   Imaging Review Dg Chest 2 View  12/02/2013   CLINICAL DATA:  Lower extremity edema  EXAM: CHEST  2 VIEW  COMPARISON:  Apr 21, 2009  FINDINGS: The lungs are clear. Heart size and pulmonary vascularity are normal. No adenopathy. No bone lesions.  IMPRESSION: No abnormality noted.   Electronically Signed   By: Bretta Bang M.D.   On: 12/02/2013 20:12    EKG Interpretation   None       MDM   1. Cellulitis of left lower extremity    The patient has bilateral lower externally swelling left greater than right, there is erythema specifically on the left lower extremity consistent with a cellulitis. This is tender but not indurated. It is circumferential and spreading proximally. She has normal pulses at the feet, no fever and no tachycardia at this time. He rapidly progressive nature of his apparent cellulitis she will need to have intravenous antibiotics and admission to the hospital. She can receive a ultrasound while inpatient but at this time is not urgently necessary as this does appear to be infection rather than thromboembolism.  Briefly the patient has a normal blood count, no fever, will be admitted to a medical surgical bed for IV antibiotics and observation overnight. Discussed with hospitalist who was in agreement.  Vida Roller, MD 12/03/13 [phone]

## 2013-12-04 DIAGNOSIS — E049 Nontoxic goiter, unspecified: Secondary | ICD-10-CM

## 2013-12-04 DIAGNOSIS — R609 Edema, unspecified: Secondary | ICD-10-CM

## 2013-12-04 MED ORDER — DIPHENHYDRAMINE HCL 25 MG PO CAPS: 25.0000 mg | ORAL_CAPSULE | Freq: Four times a day (QID) | ORAL | Status: DC | PRN

## 2013-12-04 MED ORDER — SALINE SPRAY 0.65 % NA SOLN
1.0000 | NASAL | Status: DC | PRN
  Filled 2013-12-04: qty 44

## 2013-12-04 MED ORDER — CLINDAMYCIN PHOSPHATE 600 MG/50ML IV SOLN
600.0000 mg | Freq: Three times a day (TID) | INTRAVENOUS | Status: DC
  Administered 2013-12-04 – 2013-12-05 (×4): 600 mg via INTRAVENOUS
  Filled 2013-12-04 (×5): qty 50

## 2013-12-04 MED ORDER — OXYMETAZOLINE HCL 0.05 % NA SOLN
1.0000 | Freq: Two times a day (BID) | NASAL | Status: DC | PRN
  Filled 2013-12-04: qty 15

## 2013-12-04 NOTE — Progress Notes (Signed)
[facility] PROGRESS NOTE  Cassandra Levy ZOX:[id number] DOB: [birth year] DOA: 12/02/2013 PCP: Tally Due, MD  Assessment/Plan  Lower extremity cellulitis, slight improvement since yesterday -  Anticipate discharge on clindamycin so will change to clindamycin IV today   Lower extremity swelling is most likely due to venous stasis, but rule out nephrotic syndrome/heart failure -  Elevate legs -  UA without proteinuria -  ECHO unremarkable -  Consider myxedema despite synthroid?  Check fT4 and fT3 -  Duplex lower extremities:  Neg  Hypothyroidism, TSH very suppressed.  Was suppressed in August at which time her dose was decreased from 125 to .   -  Reduced synthroid to daily  -  Will need repeat TSH in 2-4 weeks.   -  Checking fT4 and fT3 because of lower extremity symptoms  Sinus tachycardia may be related to infection or anxiety.  Most likely secondary to iatrogenic hyperthyroidism.  -  Reduced dose of synthroid  Diet:  regular Access:  PIV IVF:  KVO Proph:  Lovenox  Code Status: full Family Communication: patient, her mother and husband Disposition Plan:  Pending further improvement in lower extremity erythema/cellulitis   Consultants:  none  Procedures:  Duplex neg  ECHO  Antibiotics:  Vancomycin 12/19 >> 12/20  Clindamycin 12/20 >>  HPI/Subjective:  Redness is less red, but has new spots which have developed on the upper legs.     Objective: Filed Vitals:   12/03/13 0202 12/03/13 1400 12/03/13 2142 12/04/13 0541  BP: 138/81 141/66 143/77 130/68  Pulse: 110 109 102 101  Temp: 98.2 F (36.8 C) 98.5 F (36.9 C) 98.5 F (36.9 C) 98.2 F (36.8 C)  TempSrc: Oral Oral Oral Oral  Resp: 16 18 20 20   Height: 5\' 8"  (1.727 m)     Weight: 152.6 kg (336 lb 6.8 oz)     SpO2: 95% 100% 100% 99%    Intake/Output Summary (Last 24 hours) at 12/04/13 1113 Last data filed at 12/04/13 0700  Gross per 24 hour  Intake   1140 ml   Output    350 ml  Net    790 ml   Filed Weights   12/03/13 0202  Weight: 152.6 kg (336 lb 6.8 oz)    Exam:   General:  Obese CF, No acute distress  HEENT:  NCAT, MMM  Cardiovascular:  Tachycardic, regular rhythm, nl S1, S2 no mrg, 2+ pulses, warm extremities  Respiratory:  CTAB, no increased WOB  Abdomen:   NABS, soft, NT/ND  MSK:   Normal tone and bulk, 1+ bilateral LEE.  Erythema of the medial right calf hand sized, erythema of the anterior/medial/lateral left shin extending up to the knee and down to a line just about the ankle with some dime sized satellite lesions.  Blanching, no petechiae  Neuro:  Grossly intact  Data Reviewed: Basic Metabolic Panel:  Recent Labs Lab 12/02/13 1858 12/03/13 0017 12/03/13 0455  NA 139 133* 136  K 4.7 4.7 3.6  CL 104 101 102  CO2 25 22 24   GLUCOSE 84 83 83  BUN 14 15 13   CREATININE 0.61 0.57 0.65  CALCIUM 9.5 9.5 9.0   Liver Function Tests:  Recent Labs Lab 12/03/13 0455  AST 16  ALT 20  ALKPHOS 81  BILITOT 0.7  PROT 6.4  ALBUMIN 3.1*   No results found for this basename: LIPASE, AMYLASE,  in the last 168 hours No results found for this basename: AMMONIA,  in the last  168 hours CBC:  Recent Labs Lab 12/03/13 0017 12/03/13 0455  WBC 9.2 7.9  NEUTROABS 5.7 4.4  HGB 13.1 12.2  HCT 38.8 37.2  MCV 82.6 82.9  PLT 339 308   Cardiac Enzymes: No results found for this basename: CKTOTAL, CKMB, CKMBINDEX, TROPONINI,  in the last 168 hours BNP (last 3 results) No results found for this basename: PROBNP,  in the last 8760 hours CBG:  Recent Labs Lab 12/03/13 0812  GLUCAP 76    No results found for this or any previous visit (from the past 240 hour(s)).   Studies: Dg Chest 2 View  12/02/2013   CLINICAL DATA:  Lower extremity edema  EXAM: CHEST  2 VIEW  COMPARISON:  Apr 21, 2009  FINDINGS: The lungs are clear. Heart size and pulmonary vascularity are normal. No adenopathy. No bone lesions.  IMPRESSION: No  abnormality noted.   Electronically Signed   By: Bretta Bang M.D.   On: 12/02/2013 20:12    Scheduled Meds: . clindamycin (CLEOCIN) IV  600 mg Intravenous TID  . docusate sodium  100 mg Oral BID  . enoxaparin (LOVENOX) injection  75 mg Subcutaneous Q24H  . levothyroxine  88 mcg Oral QAC breakfast   Continuous Infusions:    Principal Problem:   Cellulitis of left lower extremity Active Problems:   Hypothyroid   Cellulitis   Lower extremity edema    Time spent: 30 min    Cassandra Levy  [facility] Pager [phone]. If 7PM-7AM, please contact night-coverage at [email or web address], password [facility] 12/04/2013, 11:13 AM  LOS: 2 days

## 2013-12-05 DIAGNOSIS — L0291 Cutaneous abscess, unspecified: Secondary | ICD-10-CM

## 2013-12-05 MED ORDER — CLINDAMYCIN HCL 300 MG PO CAPS: 300.0000 mg | ORAL_CAPSULE | Freq: Three times a day (TID) | ORAL | Status: DC

## 2013-12-05 MED ORDER — SYNTHROID 75 MCG PO TABS: 75.0000 ug | ORAL_TABLET | Freq: Every day | ORAL | Status: DC

## 2013-12-05 MED ORDER — CAMPHOR-MENTHOL 0.5-0.5 % EX LOTN: TOPICAL_LOTION | CUTANEOUS | Status: DC | PRN

## 2013-12-05 NOTE — Discharge Summary (Addendum)
Physician Discharge Summary  Cassandra Levy ZOX:[id number] DOB: 08-26-1981 DOA: 12/02/2013  PCP: Tally Due, MD  Admit date: 12/02/2013 Discharge date: 12/05/2013  Recommendations for Outpatient Follow-up:  1. PCP in 2-3 weeks to repeat TSH and make sure that it is at least starting to trend up.  If it is still very low, recommend reducing dose further or holding altogether.  Further testing for etiology of hypothyroidism (and possibly new hyperthyroidism if no longer synthroid dependent).  Check blood pressure and heart rate to make sure these are improving on reduced dose.  Leg exam in 1 week if not improving.  Consider dermatology referral.    Discharge Diagnoses:  Principal Problem:   Cellulitis of left lower extremity Active Problems:   Hypothyroid   Cellulitis   Lower extremity edema   Discharge Condition: stable, improving  Diet recommendation:  Low salt  Wt Readings from Last 3 Encounters:  12/03/13 152.6 kg (336 lb 6.8 oz)  12/02/13 152.771 kg (336 lb 12.8 oz)  08/11/13 151.955 kg (335 lb)    History of present illness:  Cassandra Levy is a 32 y.o. female history of hyperthyroidism and polycystic ovarian syndrome was advised to come to the ER because patient's d-dimer was found to be elevated. Patient has been experiencing swelling of her lower extremities for last few days after she had worn new boots at her workplace. Last 2 days she started developing some increased erythema on both lower extremities but more on the left. Yesterday she also developed pain which made her go to her primary care physician. At her primary care physician's office she had labs drawn which included d-dimer. Patient was discharge home on doxycycline for cellulitis of the left lower extremity. At that time patient did not want to get admitted. She was told oh the phone that her d-dimer was elevated and advised to come to the ER. At this time patient has been started IV vancomycin for  cellulitis of the left lower extremity and admitted for further management. Patient states that off and on she does experience mild shortness of breath which is not new. Denies any chest pain nausea vomiting abdominal pain diarrhea fever chills.   Hospital Course:   Lower extremity cellulitis and folliculitis:  Started on IV vancomycin initially and had some improvement in the redness and warmth.  She was transitioned to IV clindamycin with anticipation of continuing clindamycin orally to complete a course of antibiotics for cellulitis. She continued to have decreased redness and swelling of both legs. Recommend that she elevate her legs and continue her antibiotics, however if her legs are not improving, she should see her primary care doctor in approximately one week for repeat evaluation.  Consider thyroid-related swelling and erythema also.    Lower extremity swelling, likely due to venous stasis but may be related to endocrinopathy.  Her lower extremity duplex was negative. Her urinalysis was without proteinuria. Her echocardiogram was unremarkable. She is hard eating a low-salt diet and was advised to elevate it.  Consider support stockings once her acute infection is over.  Hypothyroidism, on Synthroid. Her primary care doctor notes, her dose was reduced in September due to low TSH level. Her synthroid dose was decreased from to .  Repeat TSH was undetectable during this admission. It was repeated and still undetectable and free T4 and T3 were both markedly elevated.  Patient states that previously, she has not tolerated large dose adjustments to her synthroid, therefore, her Synthroid dose was reduced  to 75 mcg daily and she should have followup with her primary care doctor in just 2 weeks for repeat thyroid function tests. If her TSH is still markedly suppressed, consider stopping her Synthroid altogether for a Rhea Thrun while and restarting if needed versus continuing to slowly reduce her  dose.  Lab Results  Component Value Date   TSH <0.008* 12/03/2013   Free T2 6.5, free T4 2.25  Sinus tachycardia was likely related to her infection and Synthroid. Reduce her dose of Synthroid and has been treating her infection. Her heart rate is trending down to the 90s range.  Did not start beta blocker at this time.    Elevated blood pressure, may be related to acute infection or hyperthyroid or may reflect HTN.  PCP to address after thyroid and infectious issues have stabilized.    Procedures:  Duplex neg  ECHO Antibiotics:  Vancomycin 12/19 >> 12/20  Clindamycin 12/20 >>  Discharge Exam: Filed Vitals:   12/05/13 0537  BP: 140/55  Pulse: 95  Temp: 97.9 F (36.6 C)  Resp: 18   Filed Vitals:   12/04/13 0541 12/04/13 1414 12/04/13 2124 12/05/13 0537  BP: 130/68 149/86 151/83 140/55  Pulse: 101 104 97 95  Temp: 98.2 F (36.8 C) 98.1 F (36.7 C) 98.1 F (36.7 C) 97.9 F (36.6 C)  TempSrc: Oral Oral Oral Oral  Resp: 20 18 18 18   Height:      Weight:      SpO2: 99% 97% 99% 97%    General: Obese CF, No acute distress  HEENT: NCAT, MMM  Cardiovascular: RRR, nl S1, S2 no mrg, 2+ pulses, warm extremities  Respiratory: CTAB, no increased WOB  Abdomen: NABS, soft, NT/ND  MSK: Normal tone and bulk, 1+ bilateral LEE. Erythema of the medial right calf extending anteriorly, erythema of the anterior/medial/lateral left shin extending up to the knee and down to a line just about the ankle with some dime sized satellite lesions. Blanching, no petechiae.  Satellite lesions are somewhat smaller now.   Neuro: Grossly intact   Discharge Instructions      Discharge Orders   Future Orders Complete By Expires   Call MD for:  difficulty breathing, headache or visual disturbances  As directed    Call MD for:  extreme fatigue  As directed    Call MD for:  hives  As directed    Call MD for:  persistant dizziness or light-headedness  As directed    Call MD for:  persistant nausea  and vomiting  As directed    Call MD for:  severe uncontrolled pain  As directed    Call MD for:  temperature >100.4  As directed    Diet - low sodium heart healthy  As directed    Discharge instructions  As directed    Comments:     You were hospitalized with swelling and redness of your legs.  Please take clindamycin 3 times a day until gone.  You may take aleve or ibuprofen for the discomfort.  Please keep your legs elevated.   If your legs are not improving, please see your primary care doctor within 1 week of discharge.  Your synthroid dose was too high.  After looking again at your thyroid levels, I think we should reduce your dose to per day for now to allow some of the thyroid hormone to get out of your system.  I gave you a 30 day supply for now.  Make sure your  see your primary care doctor in 2 weeks to repeat your tests and they can adjust your dose again based on the test results.  If you develop copious watery diarrhea, please seek immediate medical attention as this can be a sign of infectious diarrhea which needs treatment with antibiotics.   Increase activity slowly  As directed        Medication List    STOP taking these medications       doxycycline 100 MG tablet  Commonly known as:  VIBRA-TABS     levothyroxine 112 MCG tablet  Commonly known as:  SYNTHROID  Replaced by:  SYNTHROID 75 MCG tablet      TAKE these medications       camphor-menthol lotion  Commonly known as:  SARNA  Apply topically as needed for itching.     clindamycin 300 MG capsule  Commonly known as:  CLEOCIN  Take 1 capsule (300 mg total) by mouth 3 (three) times daily.     ibuprofen 200 MG tablet  Commonly known as:  ADVIL,MOTRIN  Take 200 mg by mouth every 6 (six) hours as needed.     metFORMIN 500 MG tablet  Commonly known as:  GLUCOPHAGE  Take 500 mg by mouth 2 (two) times daily with a meal.     norethindrone 5 MG tablet  Commonly known as:  AYGESTIN  Take 10 mg by mouth daily.      SYNTHROID 75 MCG tablet  Generic drug:  levothyroxine  Take 1 tablet (75 mcg total) by mouth daily before breakfast.       Follow-up Information   Follow up with GUEST, Loretha Stapler, MD. Schedule an appointment as soon as possible for a visit in 2 weeks. (or sooner if needed)    Specialty:  Internal Medicine   Contact information:   [address] [place] [address] [phone]       The results of significant diagnostics from this hospitalization (including imaging, microbiology, ancillary and laboratory) are listed below for reference.    Significant Diagnostic Studies: Dg Chest 2 View  12/02/2013   CLINICAL DATA:  Lower extremity edema  EXAM: CHEST  2 VIEW  COMPARISON:  Apr 21, 2009  FINDINGS: The lungs are clear. Heart size and pulmonary vascularity are normal. No adenopathy. No bone lesions.  IMPRESSION: No abnormality noted.   Electronically Signed   By: Bretta Bang M.D.   On: 12/02/2013 20:12    Microbiology: No results found for this or any previous visit (from the past 240 hour(s)).   Labs: Basic Metabolic Panel:  Recent Labs Lab 12/02/13 1858 12/03/13 0017 12/03/13 0455  NA 139 133* 136  K 4.7 4.7 3.6  CL 104 101 102  CO2 25 22 24   GLUCOSE 84 83 83  BUN 14 15 13   CREATININE 0.61 0.57 0.65  CALCIUM 9.5 9.5 9.0   Liver Function Tests:  Recent Labs Lab 12/03/13 0455  AST 16  ALT 20  ALKPHOS 81  BILITOT 0.7  PROT 6.4  ALBUMIN 3.1*   No results found for this basename: LIPASE, AMYLASE,  in the last 168 hours No results found for this basename: AMMONIA,  in the last 168 hours CBC:  Recent Labs Lab 12/03/13 0017 12/03/13 0455  WBC 9.2 7.9  NEUTROABS 5.7 4.4  HGB 13.1 12.2  HCT 38.8 37.2  MCV 82.6 82.9  PLT 339 308   Cardiac Enzymes: No results found for this basename: CKTOTAL, CKMB, CKMBINDEX, TROPONINI,  in the  last 168 hours BNP: BNP (last 3 results) No results found for this basename: PROBNP,  in the last 8760  hours CBG:  Recent Labs Lab 12/03/13 0812  GLUCAP 76    Time coordinating discharge: 45 minutes  Signed:  Eryc Bodey  [facility] 12/05/2013, 10:32 AM

## 2013-12-05 NOTE — Progress Notes (Signed)
Patient discharged to home with family via wheelchair, discharge instructions reviewed with patient who verbalized understanding. 

## 2013-12-06 ENCOUNTER — Telehealth: Payer: Self-pay | Admitting: Family Medicine

## 2013-12-06 NOTE — Telephone Encounter (Signed)
Called and discussed with her.  She is feeling overall better.  Her synthroid use has been decreased.  She will see me later this week prior to RTW. She will see [place] on christmas day for a recheck

## 2013-12-09 ENCOUNTER — Telehealth: Payer: Self-pay | Admitting: *Deleted

## 2013-12-09 ENCOUNTER — Telehealth: Payer: Self-pay | Admitting: Family Medicine

## 2013-12-09 DIAGNOSIS — T7840XA Allergy, unspecified, initial encounter: Secondary | ICD-10-CM

## 2013-12-09 MED ORDER — EPINEPHRINE 0.3 MG/0.3ML IJ SOAJ: 0.3000 mg | Freq: Once | INTRAMUSCULAR | Status: AC

## 2013-12-09 NOTE — Telephone Encounter (Signed)
Called her back to discuss.  She states that her legs are a little bit better.  She does have some hives on her trunk, but no lip or tongue swelling, SOB or mouth lesions.   Asked her to stop taking the cleocin.  Encouraged her to go to the ED for evaluation now but failing that she will come to see [place] tomorrow am.  Called in an epipen for her and discussed use.   She may use benadyl as needed for hives

## 2013-12-09 NOTE — Telephone Encounter (Signed)
Received another call from the family around 10:45 pm.  Sadly Cassandra Levy's dad is doing very poorly in [place] and she has to leave now to be with him.  She has the doxycycline rx that we gave her last week- asked her to change to taking that instead of the cleocin which we think she may be allergic to.  She will try to be seen at a clinic in [facility] when she can.

## 2013-12-09 NOTE — Telephone Encounter (Signed)
Pt states that she is having hives and she is unable to come in.  I advise her that she must come in or go to ER to be evaluated.  She would like a call from Dr. Patsy Lager.

## 2013-12-13 ENCOUNTER — Ambulatory Visit (INDEPENDENT_AMBULATORY_CARE_PROVIDER_SITE_OTHER): Payer: 59 | Admitting: Family Medicine

## 2013-12-13 VITALS — BP 145/80 | HR 90 | Temp 98.0°F | Resp 16 | Ht 67.0 in | Wt 339.0 lb

## 2013-12-13 DIAGNOSIS — R609 Edema, unspecified: Secondary | ICD-10-CM

## 2013-12-13 DIAGNOSIS — L02419 Cutaneous abscess of limb, unspecified: Secondary | ICD-10-CM

## 2013-12-13 DIAGNOSIS — E282 Polycystic ovarian syndrome: Secondary | ICD-10-CM | POA: Insufficient documentation

## 2013-12-13 DIAGNOSIS — L03116 Cellulitis of left lower limb: Secondary | ICD-10-CM

## 2013-12-13 LAB — BASIC METABOLIC PANEL
BUN: 9 mg/dL (ref 6–23)
CO2: 28 mEq/L (ref 19–32)
Calcium: 9.4 mg/dL (ref 8.4–10.5)
Creat: 0.59 mg/dL (ref 0.50–1.10)
Glucose, Bld: 100 mg/dL — ABNORMAL HIGH (ref 70–99)
Sodium: 139 mEq/L (ref 135–145)

## 2013-12-13 MED ORDER — DOXYCYCLINE HYCLATE 100 MG PO CAPS: 100.0000 mg | ORAL_CAPSULE | Freq: Two times a day (BID) | ORAL | Status: DC

## 2013-12-13 MED ORDER — HYDROCHLOROTHIAZIDE 25 MG PO TABS: 12.5000 mg | ORAL_TABLET | Freq: Two times a day (BID) | ORAL | Status: DC

## 2013-12-13 NOTE — Progress Notes (Addendum)
[facility] [address], [place] [address] [phone]  Date:  12/13/2013   Name:  Cassandra Levy   DOB:  1981-09-14   MRN:  [id number]  PCP:  Tally Due, MD    Chief Complaint: Follow-up   History of Present Illness:  Cassandra Levy is a 32 y.o. very pleasant female patient who presents with the following:  Here today to recheck LE edema and cellulitis.  See OV dated 12/18.  She was admitted from 12/18 to 12/05/13 for IV vanc. She was supposed to follow-up on 12/25 but could not because her father became very ill and then passed away later that day following a short battle with cancer. However I did speak to her on Christmas; we had to stop her clindamycin due to possible allergic reaction with hives.  She changed to the po doxycycline that she had at home from her original admission.  Since we last spoke she is doing well as far as her legs. Her pain and redness continue to decrease.  She is concerned about her swelling and would like to use a fluid medication if possible.  Also, her BP is high today which is concerning her.  She did have her synthroid adjusted in response to a very suppressed TSH last week in the hospital- plan to recheck this soon  She continues to have an itchy, palpable rash on her forearms.  However this is improving and she does not have any new hives, any lip or tongue swelling or other sign of acute allergic reaction  Patient Active Problem List   Diagnosis Date Noted  . Cellulitis of left lower extremity 12/03/2013  . Cellulitis 12/03/2013  . Lower extremity edema 12/03/2013  . Obesity 10/08/2012  . Goiter 06/27/2012  . Constipation 06/27/2012  . Hypothyroid 06/27/2012    Past Medical History  Diagnosis Date  . Thyroid disease   . Allergy   . Anemia     Past Surgical History  Procedure Laterality Date  . Colposcopy      History  Substance Use Topics  . Smoking status: Never Smoker   . Smokeless  tobacco: Not on file  . Alcohol Use: No    Family History  Problem Relation Age of Onset  . Stroke Mother   . Leukemia Other     Allergies  Allergen Reactions  . Amoxicillin Swelling    Tightness in throat  . Cleocin [Clindamycin Hcl]     hives    Medication list has been reviewed and updated.  Current Outpatient Prescriptions on File Prior to Visit  Medication Sig Dispense Refill  . camphor-menthol (SARNA) lotion Apply topically as needed for itching.  222 mL  0  . EPINEPHrine (EPIPEN) 0.3 mg/0.3 mL SOAJ injection Inject 0.3 mLs (0.3 mg total) into the muscle once.  2 Device  2  . ibuprofen (ADVIL,MOTRIN) 200 MG tablet Take 200 mg by mouth every 6 (six) hours as needed.      . metFORMIN (GLUCOPHAGE) 500 MG tablet Take 500 mg by mouth 2 (two) times daily with a meal.       . norethindrone (AYGESTIN) 5 MG tablet Take 10 mg by mouth daily.       Marland Kitchen SYNTHROID 75 MCG tablet Take 1 tablet (75 mcg total) by mouth daily before breakfast.  30 tablet  0   No current facility-administered medications on file prior to visit.    Review of Systems:  As per HPI-  otherwise negative.   Physical Examination: Filed Vitals:   12/13/13 1514  BP: 180/86  Pulse: 112  Temp: 98 F (36.7 C)  Resp: 16   Filed Vitals:   12/13/13 1514  Height: 5\' 7"  (1.702 m)  Weight: 339 lb (153.769 kg)   Body mass index is 53.08 kg/(m^2). Ideal Body Weight: Weight in (lb) to have BMI = 25: 159.3  GEN: WDWN, NAD, Non-toxic, A & O x 3, obese, looks well HEENT: Atraumatic, Normocephalic. Neck supple. No masses, No LAD.  Bilateral TM wnl, oropharynx normal.  PEERL,EOMI.   Ears and Nose: No external deformity. CV: RRR, No M/G/R. No JVD. No thrill. No extra heart sounds. PULM: CTA B, no wheezes, crackles, rhonchi. No retractions. No resp. distress. No accessory muscle use. EXTR: No c/c/e NEURO Normal gait.  PSYCH: Normally interactive. Conversant. Not depressed or anxious appearing.  Calm demeanor.  Her  bilateral LE are still edematous and show slight induration, but are much, much better than they had been Left calf measures 24 inches, right measures 23 1/4 inches.  She has pitting edema bilaterally.   She has a palpable rash on her bilateral forearms which is slightly excoriated. No hives. Otherwise her skin is clear  Assessment and Plan: Cellulitis of leg, left - Plan: hydrochlorothiazide (HYDRODIURIL) 25 MG tablet, doxycycline (VIBRAMYCIN) 100 MG capsule  Edema - Plan: Basic metabolic panel  PCOS (polycystic ovarian syndrome)  Cassandra Levy is looking much better today; we are very pleased with her progress.  She will continue twice daily doxycycline and add HCTZ as needed for swelling.  This will also help to control her BP.  She will recheck next week at which time we will recheck her TSH  Meds ordered this encounter  Medications  . doxycycline (DORYX) 100 MG DR capsule    Sig: Take 100 mg by mouth 2 (two) times daily.  . hydrochlorothiazide (HYDRODIURIL) 25 MG tablet    Sig: Take 0.5 tablets (12.5 mg total) by mouth 2 (two) times daily.    Dispense:  30 tablet    Refill:  3  . doxycycline (VIBRAMYCIN) 100 MG capsule    Sig: Take 1 capsule (100 mg total) by mouth 2 (two) times daily.    Dispense:  40 capsule    Refill:  0    Signed Abbe Amsterdam, MD  12/30: called and let her know that her BMP is normal

## 2013-12-13 NOTE — Patient Instructions (Signed)
Continue taking the doxycycline until we meet next week. Try taking a 1/2 HCTZ twice a day as needed for swelling.   Let me know if you have any other problems.    I am so sorry for the loss of your dad.  We are thinking about you and your family

## 2013-12-22 ENCOUNTER — Ambulatory Visit (INDEPENDENT_AMBULATORY_CARE_PROVIDER_SITE_OTHER): Payer: 59 | Admitting: Family Medicine

## 2013-12-22 VITALS — BP 142/75 | HR 97 | Temp 98.2°F | Resp 18 | Ht 67.5 in | Wt 327.0 lb

## 2013-12-22 DIAGNOSIS — R609 Edema, unspecified: Secondary | ICD-10-CM

## 2013-12-22 DIAGNOSIS — R600 Localized edema: Secondary | ICD-10-CM

## 2013-12-22 DIAGNOSIS — E079 Disorder of thyroid, unspecified: Secondary | ICD-10-CM

## 2013-12-22 LAB — BASIC METABOLIC PANEL
BUN: 12 mg/dL (ref 6–23)
CHLORIDE: 101 meq/L (ref 96–112)
CO2: 27 meq/L (ref 19–32)
Calcium: 9.5 mg/dL (ref 8.4–10.5)
Creat: 0.53 mg/dL (ref 0.50–1.10)
GLUCOSE: 88 mg/dL (ref 70–99)
POTASSIUM: 3.8 meq/L (ref 3.5–5.3)
Sodium: 137 mEq/L (ref 135–145)

## 2013-12-22 NOTE — Patient Instructions (Signed)
I will look for your next week- if you like you can also make an appt to see me on Monday.  Increase your HCTZ to a 1/2 tablet 3x a day.  You can also take a whole in the am and a 1/2 in the pm if more convenient.    [facility] to [facility] and have them help you with a pair of compression hose.    Let me know if you have any problems in the meantime!  I will be in touch regarding your labs.

## 2013-12-22 NOTE — Progress Notes (Addendum)
[facility] [address], [place] [id number] [phone]  Date:  12/22/2013   Name:  Cassandra CopaJessica L Levy   DOB:  01-Jun-1981   MRN:  [id number]  PCP:  Tally DueGUEST, CHRIS WARREN, MD    Chief Complaint: Follow-up   History of Present Illness:  Cassandra CopaJessica L Levy is a 33 y.o. very pleasant female patient who presents with the following:  Here today to recheck cellulitis of her legs. Seen most recently about a week ago at which point she was doing very well. Earlier in December she was admitted to the hospital for IV abx  She has not yet gone back to work.  However she was on her feet for a long time (about 8 hours total) this past Friday- the day of her father's funeral.  Today is Wednesday.  After standing for this time period she swelled badly again and had heat and pain- this is now better but it took 3 days to settle down again.    She is taking HCTZ 12.5 BID.  This seems to help her some with her swelling.  She is also on Doxy- taking 100 BID  Overall she is feeling well and does not have any fever  Wt Readings from Last 3 Encounters:  12/22/13 327 lb (148.326 kg)  12/13/13 339 lb (153.769 kg)  12/03/13 336 lb 6.8 oz (152.6 kg)     Patient Active Problem List   Diagnosis Date Noted  . PCOS (polycystic ovarian syndrome) 12/13/2013  . Cellulitis of left lower extremity 12/03/2013  . Cellulitis 12/03/2013  . Lower extremity edema 12/03/2013  . Obesity 10/08/2012  . Goiter 06/27/2012  . Constipation 06/27/2012  . Hypothyroid 06/27/2012    Past Medical History  Diagnosis Date  . Thyroid disease   . Allergy   . Anemia     Past Surgical History  Procedure Laterality Date  . Colposcopy      History  Substance Use Topics  . Smoking status: Never Smoker   . Smokeless tobacco: Not on file  . Alcohol Use: No    Family History  Problem Relation Age of Onset  . Stroke Mother   . Leukemia Other     Allergies  Allergen Reactions  . Amoxicillin  Swelling    Tightness in throat  . Cleocin [Clindamycin Hcl]     hives    Medication list has been reviewed and updated.  Current Outpatient Prescriptions on File Prior to Visit  Medication Sig Dispense Refill  . camphor-menthol (SARNA) lotion Apply topically as needed for itching.  222 mL  0  . doxycycline (DORYX) 100 MG DR capsule Take 100 mg by mouth 2 (two) times daily.      Marland Kitchen. doxycycline (VIBRAMYCIN) 100 MG capsule Take 1 capsule (100 mg total) by mouth 2 (two) times daily.  40 capsule  0  . EPINEPHrine (EPIPEN) 0.3 mg/0.3 mL SOAJ injection Inject 0.3 mLs (0.3 mg total) into the muscle once.  2 Device  2  . hydrochlorothiazide (HYDRODIURIL) 25 MG tablet Take 0.5 tablets (12.5 mg total) by mouth 2 (two) times daily.  30 tablet  3  . ibuprofen (ADVIL,MOTRIN) 200 MG tablet Take 200 mg by mouth every 6 (six) hours as needed.      . metFORMIN (GLUCOPHAGE) 500 MG tablet Take 500 mg by mouth 2 (two) times daily with a meal.       . norethindrone (AYGESTIN) 5 MG tablet Take 10 mg by mouth daily.       .Marland Kitchen  SYNTHROID 75 MCG tablet Take 1 tablet (75 mcg total) by mouth daily before breakfast.  30 tablet  0   No current facility-administered medications on file prior to visit.    Review of Systems:  As per HPI- otherwise negative.Marland Kitchen  Physical Examination: Filed Vitals:   12/22/13 1351  BP: 160/78  Pulse: 97  Temp: 98.2 F (36.8 C)  Resp: 18   Filed Vitals:   12/22/13 1351  Height: 5' 7.5" (1.715 m)  Weight: 327 lb (148.326 kg)   Body mass index is 50.43 kg/(m^2). Ideal Body Weight: Weight in (lb) to have BMI = 25: 161.7  GEN: WDWN, NAD, Non-toxic, A & O x 3, obese, looks well HEENT: Atraumatic, Normocephalic. Neck supple. No masses, No LAD. Ears and Nose: No external deformity. CV: RRR, No M/G/R. No JVD. No thrill. No extra heart sounds. PULM: CTA B, no wheezes, crackles, rhonchi. No retractions. No resp. distress. No accessory muscle use. ABD: S, NT, ND, +BS. No rebound. No  HSM. EXTR: No c/c/e NEURO Normal gait.  PSYCH: Normally interactive. Conversant. Not depressed or anxious appearing.  Calm demeanor.  Her LE are still slightly edematous, and the left is sightly inflamed.  However she has minimal to no heat and no tenderness  Assessment and Plan: Lower extremity edema - Plan: Basic metabolic panel  Thyroid disorder - Plan: TSH, T3, Free, T4, Free  At this time Cassandra Levy's legs appear to be doing ok.  She is concerned about how she will do when she has to stand up at work next week. Will keep her out for the rest of this week and will recheck her on Monday.  At that time may release her to modified duty Compression hose rx  Await her labs She can increase her HCTZ to 1.5 of her 25 mg tabs a day  Signed Abbe Amsterdam, MD  1/8: called to go over her labs.  At this time her TSH is still too low, and her T3 and T4 are high.  Will hold her thyroid medication entirely.  Will recheck in 3 weeks.  She will see me on Monday to recheck her leg Results for orders placed in visit on 12/22/13  BASIC METABOLIC PANEL      Result Value Range   Sodium 137  135 - 145 mEq/L   Potassium 3.8  3.5 - 5.3 mEq/L   Chloride 101  96 - 112 mEq/L   CO2 27  19 - 32 mEq/L   Glucose, Bld 88  70 - 99 mg/dL   BUN 12  6 - 23 mg/dL   Creat 4.78  2.95 - 6.21 mg/dL   Calcium 9.5  8.4 - 30.8 mg/dL  TSH      Result Value Range   TSH <0.008 (*) 0.350 - 4.500 uIU/mL  T3, FREE      Result Value Range   T3, Free 6.3 (*) 2.3 - 4.2 pg/mL  T4, FREE      Result Value Range   Free T4 2.49 (*) 0.80 - 1.80 ng/dL

## 2013-12-23 LAB — T4, FREE: Free T4: 2.49 ng/dL — ABNORMAL HIGH (ref 0.80–1.80)

## 2013-12-23 LAB — TSH: TSH: <0.008 u[IU]/mL — ABNORMAL LOW (ref 0.350–4.500)

## 2013-12-23 LAB — T3, FREE: T3, Free: 6.3 pg/mL — ABNORMAL HIGH (ref 2.3–4.2)

## 2013-12-23 NOTE — Addendum Note (Signed)
Addended by: Abbe AmsterdamOPLAND, Corie C on: 12/23/2013 05:43 PM   Modules accepted: Orders

## 2013-12-27 ENCOUNTER — Ambulatory Visit (INDEPENDENT_AMBULATORY_CARE_PROVIDER_SITE_OTHER): Payer: 59 | Admitting: Family Medicine

## 2013-12-27 ENCOUNTER — Encounter: Payer: Self-pay | Admitting: Family Medicine

## 2013-12-27 VITALS — BP 140/78 | HR 90 | Temp 98.2°F | Resp 20 | Ht 69.0 in | Wt 320.0 lb

## 2013-12-27 DIAGNOSIS — E0590 Thyrotoxicosis, unspecified without thyrotoxic crisis or storm: Secondary | ICD-10-CM

## 2013-12-27 DIAGNOSIS — R609 Edema, unspecified: Secondary | ICD-10-CM

## 2013-12-27 LAB — BASIC METABOLIC PANEL
BUN: 13 mg/dL (ref 6–23)
CHLORIDE: 99 meq/L (ref 96–112)
CO2: 28 meq/L (ref 19–32)
CREATININE: 0.61 mg/dL (ref 0.50–1.10)
Calcium: 9.7 mg/dL (ref 8.4–10.5)
GLUCOSE: 69 mg/dL — AB (ref 70–99)
POTASSIUM: 3.9 meq/L (ref 3.5–5.3)
Sodium: 137 mEq/L (ref 135–145)

## 2013-12-27 MED ORDER — ALPRAZOLAM 0.25 MG PO TABS: 0.2500 mg | ORAL_TABLET | Freq: Two times a day (BID) | ORAL | Status: DC | PRN

## 2013-12-27 MED ORDER — METOPROLOL TARTRATE 25 MG PO TABS: 25.0000 mg | ORAL_TABLET | Freq: Two times a day (BID) | ORAL | Status: DC

## 2013-12-27 NOTE — Patient Instructions (Signed)
I will call you later on today after I speak with the endocrinologist.  You can stop the doxycycline now.   We will let you go back to work for a short day tomorrow. Let me know how this goes Hopefully in the next couple of weeks we can go back to full duty

## 2013-12-27 NOTE — Progress Notes (Addendum)
[facility] [address] [place] [address] [phone]  Date:  12/27/2013   Name:  Cassandra Levy   DOB:  October 21, 1981   MRN:  [id number]  PCP:  Tally Due, MD    Chief Complaint: Follow-up   History of Present Illness:  Cassandra Levy is a 33 y.o. very pleasant female patient who presents with the following:  Here today to follow-up her LE edema and suppressed TSH.  She is not feeling well today- she feels SOB.  She states she has felt this way since she got out of the hospital last month, if not longer.  "I feel like I just ran a mile."  She admits to being very concerned about going back to work.  She feels that she is still too deconditioned and that she has a hard time with even minimal physical activity.  She is not sure how she will be able to handle getting back to work where she often is on her feet for 8 hours.  She does note that she feels shaky, like her heart is racing, and she has little energy. She is also aware that she may be anxious and uneasy given all that she has been through recently.   She started on thyroid replacement approx 3 years ago.  This is the first time she has had trouble with iatrogenic hyperthyroidism.   She is taking HCTZ as needed for her swelling. This does seem to be helping her some  Overall her legs are doing great- her pain is resolved. Swelling is much better.  Redness is much better  Patient Active Problem List   Diagnosis Date Noted  . PCOS (polycystic ovarian syndrome) 12/13/2013  . Cellulitis of left lower extremity 12/03/2013  . Cellulitis 12/03/2013  . Lower extremity edema 12/03/2013  . Obesity 10/08/2012  . Goiter 06/27/2012  . Constipation 06/27/2012  . Hypothyroid 06/27/2012    Past Medical History  Diagnosis Date  . Thyroid disease   . Allergy   . Anemia     Past Surgical History  Procedure Laterality Date  . Colposcopy      History  Substance Use Topics  . Smoking  status: Never Smoker   . Smokeless tobacco: Not on file  . Alcohol Use: No    Family History  Problem Relation Age of Onset  . Stroke Mother   . Leukemia Other     Allergies  Allergen Reactions  . Amoxicillin Swelling    Tightness in throat  . Cleocin [Clindamycin Hcl]     hives    Medication list has been reviewed and updated.  Current Outpatient Prescriptions on File Prior to Visit  Medication Sig Dispense Refill  . doxycycline (VIBRAMYCIN) 100 MG capsule Take 1 capsule (100 mg total) by mouth 2 (two) times daily.  40 capsule  0  . EPINEPHrine (EPIPEN) 0.3 mg/0.3 mL SOAJ injection Inject 0.3 mLs (0.3 mg total) into the muscle once.  2 Device  2  . hydrochlorothiazide (HYDRODIURIL) 25 MG tablet Take 0.5 tablets (12.5 mg total) by mouth 2 (two) times daily.  30 tablet  3  . ibuprofen (ADVIL,MOTRIN) 200 MG tablet Take 200 mg by mouth every 6 (six) hours as needed.      . metFORMIN (GLUCOPHAGE) 500 MG tablet Take 500 mg by mouth 2 (two) times daily with a meal.       . norethindrone (AYGESTIN) 5 MG tablet Take 10 mg by mouth daily.       Marland Kitchen  camphor-menthol (SARNA) lotion Apply topically as needed for itching.  222 mL  0  . SYNTHROID 75 MCG tablet Take 1 tablet (75 mcg total) by mouth daily before breakfast.  30 tablet  0   No current facility-administered medications on file prior to visit.    Review of Systems:  As per HPI- otherwise negative.  Physical Examination: Filed Vitals:   12/27/13 1155  BP: 140/78  Pulse: 100  Temp: 98.2 F (36.8 C)  Resp: 20   Filed Vitals:   12/27/13 1155  Height: 5\' 9"  (1.753 m)  Weight: 320 lb (145.151 kg)   Body mass index is 47.23 kg/(m^2). Ideal Body Weight: Weight in (lb) to have BMI = 25: 168.9  GEN: WDWN, NAD, Non-toxic, A & O x 3, obese.  Somewhat tearful and anxious at start of visit today HEENT: Atraumatic, Normocephalic. Neck supple. No masses, No LAD. Ears and Nose: No external deformity. CV: RRR, No M/G/R. No JVD. No  thrill. No extra heart sounds. PULM: CTA B, no wheezes, crackles, rhonchi. No retractions. No resp. distress. No accessory muscle use. EXTR: No c/c/e NEURO Normal gait.  PSYCH: Normally interactive. Conversant. Not depressed or anxious appearing.  Calm demeanor.  Her legs look much much better today.  Redness, heat and tenderness are resolved. She has mild post- inflammatory hyperpigmentation but otherwise color is back to normal.  She does have some swelling of her lower legs; obesity may also contribute to this appearance   Assessment and Plan: Edema - Plan: Basic metabolic panel  Hyperthyroidism - Plan: metoprolol tartrate (LOPRESSOR) 25 MG tablet, ALPRAZolam (XANAX) 0.25 MG tablet  Her edema and cellulitis are now much better.  She may DC her doxycycline at this time.   Continued HCTZ as needed, check BP today Iatrogenic hyperthyroidism.  She has been off her thyroid replacement totally for one week now. Although she does feel a bit anxious her sx and physical findings are not consistent with thyroid storm.  For the time being will add a beta blocker and a low dose of xanax to use as needed.  Certainly she has been through a lot recently; she was hospitalized with very serious cellulitis of her legs, and then her father passed away and she has been responsible for handing his affairs.  These life events have taken her away from her new job and she is afraid she might lose this job.    I do feel she can return to work tomorrow on a reduced schedule and with work limitations.  She is ready to get back to work and is eager to try.    Referral to endocrinology will be made Will plan further follow- up pending labs.  Signed Abbe AmsterdamJessica Copland, MD  1/13: called to check on her.  Labs ok.   She did ok at work today but her pants did rub her leg and caused itching.  Suggested leggings or tights to protect her skin Her K is ok, but she needs to eat some foods with potassium to help prevent  hypokalemia.  Recommended a banana a day and perhaps orange juice.  She may use the HCTZ  Just as needed with this in mind.   Endocrine referral is made- let me know if she does not hear anything soon

## 2013-12-28 ENCOUNTER — Other Ambulatory Visit: Payer: Self-pay | Admitting: Family Medicine

## 2013-12-28 DIAGNOSIS — E039 Hypothyroidism, unspecified: Secondary | ICD-10-CM

## 2014-01-13 ENCOUNTER — Telehealth: Payer: Self-pay

## 2014-01-13 NOTE — Telephone Encounter (Signed)
Called her back. She is doing ok but has some swelling again.  She wants to start taking her HCTZ again so we discussed this.  She will take the HCTZ 1/2 tab 2 or 3x a day.   She does not have a fever.   She will come and see me in 10 days.

## 2014-01-13 NOTE — Telephone Encounter (Signed)
Pt calling to report that her legs are starting to swell again. She wants to let Dr. Patsy Lageropland know that she has started taking the HCTZ. She will be in next week to see you. She is traveling this weekend and will not be able to come in. She wants to confirm she is to take 2 tablets daily. She has the "pocket" back on her left leg. She is very concerned that the infection may be returning. At the end of day she feels her legs are very shaky.   She would like you to call her. Cell [phone]

## 2014-01-13 NOTE — Telephone Encounter (Signed)
Patient called as instructed by Dr. Patsy Lageropland to discuss issue with her leg and thyroid problems. She would like Dr. Patsy Lageropland to return her call so they can follow up as planned. Please return call at [phone]. Thank you

## 2014-01-17 ENCOUNTER — Ambulatory Visit: Payer: 59 | Admitting: Endocrinology

## 2014-01-19 ENCOUNTER — Encounter: Payer: Self-pay | Admitting: Endocrinology

## 2014-01-19 ENCOUNTER — Ambulatory Visit (INDEPENDENT_AMBULATORY_CARE_PROVIDER_SITE_OTHER): Payer: 59 | Admitting: Endocrinology

## 2014-01-19 VITALS — BP 140/70 | HR 92 | Temp 98.4°F | Ht 69.0 in | Wt 324.0 lb

## 2014-01-19 DIAGNOSIS — E0590 Thyrotoxicosis, unspecified without thyrotoxic crisis or storm: Secondary | ICD-10-CM

## 2014-01-19 DIAGNOSIS — E039 Hypothyroidism, unspecified: Secondary | ICD-10-CM

## 2014-01-19 NOTE — Progress Notes (Signed)
Subjective:    Patient ID: Cassandra Levy, female    DOB: 25-Jun-1981, 33 y.o.   MRN: [id number]  HPI Pt reports hypothyroidism was dx'ed in 2009, when she presented with severe weight gain.  she has been on prescribed thyroid hormone therapy from then, until last month.  she has never taken kelp or any other type of non-prescribed thyroid product.  she has never had thyroid imaging.  She is considering a pregnancy.  she has never had thyroid surgery, or XRT to the neck.  she has never been on amiodarone or lithium.  In September of 2014, based on a TSH, synthroid was increased to 125 mcg/day.  In December of 12/14, it was reduced back to 75 mcg/day.  In early 2015, it was stopped altogether.  Since then, she feels better in general.  She has moderate palpitations in the chest, but no assoc fever Past Medical History  Diagnosis Date  . Thyroid disease   . Allergy   . Anemia     Past Surgical History  Procedure Laterality Date  . Colposcopy      History   Social History  . Marital Status: Married    Spouse Name: N/A    Number of Children: N/A  . Years of Education: N/A   Occupational History  . Not on file.   Social History Main Topics  . Smoking status: Never Smoker   . Smokeless tobacco: Not on file  . Alcohol Use: No  . Drug Use: Not on file  . Sexual Activity: Not on file   Other Topics Concern  . Not on file   Social History Narrative  . No narrative on file    Current Outpatient Prescriptions on File Prior to Visit  Medication Sig Dispense Refill  . ALPRAZolam (XANAX) 0.25 MG tablet Take 1 tablet (0.25 mg total) by mouth 2 (two) times daily as needed for anxiety.  20 tablet  0  . camphor-menthol (SARNA) lotion Apply topically as needed for itching.  222 mL  0  . doxycycline (VIBRAMYCIN) 100 MG capsule Take 1 capsule (100 mg total) by mouth 2 (two) times daily.  40 capsule  0  . EPINEPHrine (EPIPEN) 0.3 mg/0.3 mL SOAJ injection Inject 0.3 mLs (0.3 mg total)  into the muscle once.  2 Device  2  . hydrochlorothiazide (HYDRODIURIL) 25 MG tablet Take 0.5 tablets (12.5 mg total) by mouth 2 (two) times daily.  30 tablet  3  . ibuprofen (ADVIL,MOTRIN) 200 MG tablet Take 200 mg by mouth every 6 (six) hours as needed.      . metFORMIN (GLUCOPHAGE) 500 MG tablet Take 500 mg by mouth 2 (two) times daily with a meal.       . metoprolol tartrate (LOPRESSOR) 25 MG tablet Take 1 tablet (25 mg total) by mouth 2 (two) times daily.  60 tablet  3  . norethindrone (AYGESTIN) 5 MG tablet Take 10 mg by mouth daily.        No current facility-administered medications on file prior to visit.    Allergies  Allergen Reactions  . Amoxicillin Swelling    Tightness in throat  . Cleocin [Clindamycin Hcl]     hives    Family History  Problem Relation Age of Onset  . Stroke Mother   . Leukemia Other   no thyroid probs.  BP 140/70  Pulse 92  Temp(Src) 98.4 F (36.9 C) (Oral)  Ht 5\' 9"  (1.753 m)  Wt 324 lb (146.965 kg)  BMI 47.82 kg/m2  SpO2 98%  Review of Systems denies depression, hair loss, cramps, sob, memory loss, constipation, numbness, blurry vision, myalgias, dry skin, rhinorrhea, and syncope.  She has lost weight.  She has easy bruising.      Objective:   Physical Exam VS: see vs page GEN: no distress HEAD: head: no deformity eyes: mild bilateral proptosis external nose and ears are normal mouth: no lesion seen NECK: supple, thyroid is 2-3 times normal size, symmetrical.  No palpable nodule.   CHEST WALL: no deformity LUNGS:  Clear to auscultation CV: reg rate and rhythm, no murmur ABD: abdomen is soft, nontender.  no hepatosplenomegaly.  not distended.  no hernia MUSCULOSKELETAL: muscle bulk and strength are grossly normal.  no obvious joint swelling.  gait is normal and steady EXTEMITIES: no deformity.  no ulcer on the feet.  feet are of normal color and temp.  Trace bilat leg edema.   PULSES: no carotid bruit NEURO:  cn 2-12 grossly intact.    readily moves all 4's.  sensation is intact to touch on all 4's.  No tremor SKIN:  Normal texture and temperature.  No rash or suspicious lesion is visible.  Mild hirsutism on the face. NODES:  None palpable at the neck PSYCH: alert, well-oriented.  Does not appear anxious nor depressed.  Lab Results  Component Value Date   TSH <0.008* 01/19/2014      Assessment & Plan:  Grave's dz.  It is uncommon for this to present with hypothyroidism, but it occasionally happens. Hyperthyroidism, due to Grave's dz. Weight loss, due to the hyperthyroidism.

## 2014-01-19 NOTE — Patient Instructions (Addendum)
blood tests are being requested for you today.  We'll contact you with results. If it overactive, please consider the treatment options we discussed. The radioactive iodine treatment works like this: We would first check a thyroid "scan" (a special, but easy and painless type of thyroid x ray).  It works like this: you go to the x-ray department of the hospital to swallow a pill, which contains a miniscule amount of radiation.  You will not notice any symptoms from this.  You will go back to the x-ray department the next day, to lie down in front of a camera.  The results of this will be sent to me.   Based on the results, i hope to order for you a treatment pill of radioactive iodine.  Although it is a larger amount of radiation, you will again notice no symptoms from this.  The pill is gone from your body in a few days (during which you should stay away from other people), but takes several months to work.  Therefore, please return here approximately 6-8 weeks after the treatment.  This treatment has been available for many years, and the only known side-effect is an underactive thyroid.  It is possible that i would eventually prescribe for you a thyroid hormone pill, which is very inexpensive.  You don't have to worry about side-effects of this thyroid hormone pill, because it is the same molecule your thyroid makes.

## 2014-01-20 DIAGNOSIS — E0590 Thyrotoxicosis, unspecified without thyrotoxic crisis or storm: Secondary | ICD-10-CM | POA: Insufficient documentation

## 2014-01-20 LAB — TSH

## 2014-01-20 LAB — T4, FREE: Free T4: 2.14 ng/dL — ABNORMAL HIGH (ref 0.80–1.80)

## 2014-01-24 ENCOUNTER — Ambulatory Visit (INDEPENDENT_AMBULATORY_CARE_PROVIDER_SITE_OTHER): Payer: 59 | Admitting: Family Medicine

## 2014-01-24 VITALS — BP 140/60 | HR 88 | Temp 97.8°F | Resp 16 | Ht 67.5 in | Wt 323.0 lb

## 2014-01-24 DIAGNOSIS — R600 Localized edema: Secondary | ICD-10-CM

## 2014-01-24 DIAGNOSIS — R05 Cough: Secondary | ICD-10-CM

## 2014-01-24 DIAGNOSIS — R609 Edema, unspecified: Secondary | ICD-10-CM

## 2014-01-24 DIAGNOSIS — R059 Cough, unspecified: Secondary | ICD-10-CM

## 2014-01-24 DIAGNOSIS — R0602 Shortness of breath: Secondary | ICD-10-CM

## 2014-01-24 NOTE — Progress Notes (Signed)
[facility] [address], [place] [address] [phone]  Date:  01/24/2014   Name:  Cassandra Levy   DOB:  July 12, 1981   MRN:  [id number]  PCP:  Tally DueGUEST, CHRIS WARREN, MD    Chief Complaint: Follow-up   History of Present Illness:  Cassandra Levy is a 33 y.o. very pleasant female patient who presents with the following:  Here today to recheck edema of her legs.  She tried compression hose but they seemed to make her legs "turn purple."  She feels that she does better without them She saw Dr. Everardo AllEllison last week and was dx with Grave's dz.  The plan is for her to take radioactive iodine as soon as she can take the time off of work to do so.   Overall she is doing better- the cellulitis in her LE is now resolved.  Her swelling of her legs is better but not 100% well yet.  When she keeps her legs elevated she does better.  She is taking hctz 25 in the morning.    She is still on light duty- she would like to try and return to full days but she is concerned about the need to raise her legs periodically.  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  She is afraid she may have an allergy to some of these material as handing them sometimes causes itching and redness of her skin.    Wt Readings from Last 3 Encounters:  01/24/14 323 lb (146.512 kg)  01/19/14 324 lb (146.965 kg)  12/27/13 320 lb (145.151 kg)     Patient Active Problem List   Diagnosis Date Noted  . Hyperthyroidism 01/20/2014  . PCOS (polycystic ovarian syndrome) 12/13/2013  . Cellulitis of left lower extremity 12/03/2013  . Cellulitis 12/03/2013  . Lower extremity edema 12/03/2013  . Obesity 10/08/2012  . Goiter 06/27/2012  . Constipation 06/27/2012    Past Medical History  Diagnosis Date  . Thyroid disease   . Allergy   . Anemia     Past Surgical History  Procedure Laterality Date  . Colposcopy      History  Substance Use  Topics  . Smoking status: Never Smoker   . Smokeless tobacco: Not on file  . Alcohol Use: No    Family History  Problem Relation Age of Onset  . Stroke Mother   . Leukemia Other     Allergies  Allergen Reactions  . Amoxicillin Swelling    Tightness in throat  . Cleocin [Clindamycin Hcl]     hives    Medication list has been reviewed and updated.  Current Outpatient Prescriptions on File Prior to Visit  Medication Sig Dispense Refill  . hydrochlorothiazide (HYDRODIURIL) 25 MG tablet Take 0.5 tablets (12.5 mg total) by mouth 2 (two) times daily.  30 tablet  3  . metFORMIN (GLUCOPHAGE) 500 MG tablet Take 500 mg by mouth 2 (two) times daily with a meal.       . norethindrone (AYGESTIN) 5 MG tablet Take 10 mg by mouth daily.       Marland Kitchen. ALPRAZolam (XANAX) 0.25 MG tablet Take 1 tablet (0.25 mg total) by mouth 2 (two) times daily as needed for anxiety.  20 tablet  0  . camphor-menthol (SARNA) lotion Apply topically as needed for itching.  222 mL  0  . doxycycline (VIBRAMYCIN) 100 MG capsule Take 1 capsule (100  mg total) by mouth 2 (two) times daily.  40 capsule  0  . EPINEPHrine (EPIPEN) 0.3 mg/0.3 mL SOAJ injection Inject 0.3 mLs (0.3 mg total) into the muscle once.  2 Device  2  . ibuprofen (ADVIL,MOTRIN) 200 MG tablet Take 200 mg by mouth every 6 (six) hours as needed.      . metoprolol tartrate (LOPRESSOR) 25 MG tablet Take 1 tablet (25 mg total) by mouth 2 (two) times daily.  60 tablet  3   No current facility-administered medications on file prior to visit.    Review of Systems:  As per HPI- otherwise negative.   Physical Examination: Filed Vitals:   01/24/14 1437  BP: 140/60  Pulse: 88  Temp: 97.8 F (36.6 C)  Resp: 16   Filed Vitals:   01/24/14 1437  Height: 5' 7.5" (1.715 m)  Weight: 323 lb (146.512 kg)   Body mass index is 49.81 kg/(m^2). Ideal Body Weight: Weight in (lb) to have BMI = 25: 161.7  GEN: WDWN, NAD, Non-toxic, A & O x 3, obese, looks well HEENT:  Atraumatic, Normocephalic. Neck supple. No masses, No LAD. Ears and Nose: No external deformity. CV: RRR, No M/G/R. No JVD. No thrill. No extra heart sounds. PULM: CTA B, no wheezes, crackles, rhonchi. No retractions. No resp. distress. No accessory muscle use. EXTR: No c/c/e NEURO Normal gait.  PSYCH: Normally interactive. Conversant. Not depressed or anxious appearing.  Calm demeanor.  LE: her cellulitis is now completely gone.  She does have larger legs/ calves so it is somewhat difficult to judge her degree of swelling.  Per her report her ankles are now nearly at her baseline.  No heat, redness, or tenseness of the tissues   Assessment and Plan: Pedal edema - Plan: Basic metabolic panel  Placed a future order to check a BMP in the next month or so Ok to work full days, with breaks to elevate her legs.   Discussed doing an immunocap panel to look for any allergies she may have.  She is going to check with her insurance prior to having this done and will let me know  Signed Abbe Amsterdam, MD

## 2014-01-24 NOTE — Patient Instructions (Signed)
Please find out about the immunocap test- if you would like to have this done please just let me know. Please come and see us for a lab visit only in the next couple of months to check your BMP  Try taking a claritin or zyrtec daily

## 2014-01-25 ENCOUNTER — Telehealth: Payer: Self-pay

## 2014-01-25 NOTE — Telephone Encounter (Signed)
Called her back.  She is still on "light duty" but it turns out she does not need a new letter after all.   Her insurance will cover the immunocap test.  She will plan to do this the next time she is seen here in about one month

## 2014-01-25 NOTE — Telephone Encounter (Signed)
Dr.Copland, Pt would like for you to re-word the letter that you wrote for her yesterday. She states that the letter needs to state light duty, since she is still having trouble with her legs.

## 2014-02-21 ENCOUNTER — Ambulatory Visit (INDEPENDENT_AMBULATORY_CARE_PROVIDER_SITE_OTHER): Payer: 59 | Admitting: Family Medicine

## 2014-02-21 VITALS — BP 122/84 | HR 99 | Temp 98.7°F | Resp 18 | Wt 327.0 lb

## 2014-02-21 DIAGNOSIS — Z9109 Other allergy status, other than to drugs and biological substances: Secondary | ICD-10-CM

## 2014-02-21 DIAGNOSIS — L03119 Cellulitis of unspecified part of limb: Secondary | ICD-10-CM

## 2014-02-21 DIAGNOSIS — R600 Localized edema: Secondary | ICD-10-CM

## 2014-02-21 DIAGNOSIS — R609 Edema, unspecified: Secondary | ICD-10-CM

## 2014-02-21 DIAGNOSIS — L02419 Cutaneous abscess of limb, unspecified: Secondary | ICD-10-CM

## 2014-02-21 DIAGNOSIS — L03116 Cellulitis of left lower limb: Secondary | ICD-10-CM

## 2014-02-21 LAB — POCT CBC
GRANULOCYTE PERCENT: 75.6 % (ref 37–80)
HEMATOCRIT: 43.3 % (ref 37.7–47.9)
Hemoglobin: 13.7 g/dL (ref 12.2–16.2)
Lymph, poc: 2 (ref 0.6–3.4)
MCH, POC: 27 pg (ref 27–31.2)
MCHC: 31.6 g/dL — AB (ref 31.8–35.4)
MCV: 85.5 fL (ref 80–97)
MID (cbc): 0.5 (ref 0–0.9)
MPV: 9.7 fL (ref 0–99.8)
POC GRANULOCYTE: 7.8 — AB (ref 2–6.9)
POC LYMPH %: 19.5 % (ref 10–50)
POC MID %: 4.9 % (ref 0–12)
Platelet Count, POC: 382 10*3/uL (ref 142–424)
RBC: 5.07 M/uL (ref 4.04–5.48)
RDW, POC: 14.7 %
WBC: 10.3 10*3/uL — AB (ref 4.6–10.2)

## 2014-02-21 LAB — COMPREHENSIVE METABOLIC PANEL
ALK PHOS: 80 U/L (ref 39–117)
ALT: 17 U/L (ref 0–35)
AST: 12 U/L (ref 0–37)
Albumin: 4 g/dL (ref 3.5–5.2)
BILIRUBIN TOTAL: 0.3 mg/dL (ref 0.2–1.2)
BUN: 15 mg/dL (ref 6–23)
CHLORIDE: 103 meq/L (ref 96–112)
CO2: 23 meq/L (ref 19–32)
Calcium: 9.1 mg/dL (ref 8.4–10.5)
Creat: 0.81 mg/dL (ref 0.50–1.10)
Glucose, Bld: 114 mg/dL — ABNORMAL HIGH (ref 70–99)
Potassium: 3.9 mEq/L (ref 3.5–5.3)
Sodium: 137 mEq/L (ref 135–145)
TOTAL PROTEIN: 6.6 g/dL (ref 6.0–8.3)

## 2014-02-21 MED ORDER — DOXYCYCLINE HYCLATE 100 MG PO CAPS: 100.0000 mg | ORAL_CAPSULE | Freq: Two times a day (BID) | ORAL | Status: DC

## 2014-02-21 MED ORDER — HYDROCHLOROTHIAZIDE 25 MG PO TABS: ORAL_TABLET | ORAL | Status: DC

## 2014-02-21 NOTE — Patient Instructions (Addendum)
I will be in touch with the rest of your labs- if your legs do not look better start the doxycycline in the next day or two.   I will be in touch with your labs.   You can take HCTZ up to 1.5 pills a day as needed for swelling.

## 2014-02-21 NOTE — Progress Notes (Signed)
[facility] [address] [place] [address] [phone]  Date:  02/21/2014   Name:  Cassandra Levy   DOB:  1981-11-20   MRN:  [id number]  PCP:  Tally Due, MD    Chief Complaint: Follow-up, Sore Throat and Cough   History of Present Illness:  Cassandra Levy is a 33 y.o. very pleasant female patient who presents with the following:  She is here today with a ST that started over the weekend.  Started to feel "feverish and chilled" yesterday. Also feels congested, and her ears are congested.    She does note a mild cough as well.    She continues to have some concerns about her legs- over the last few months she has had cellulitis and swelling of her LE.    On Friday her legs were red- she kept them elevated all weekend and they are somewhat better today.  Her legs were warm, but not hot. They felt irritated and itchy. They have improved since she kept them up over the weekend.   She was last seen here a month ago; she had done fairly well in the interim.  She is still on light duty, but is afraid that when she is released to full duty she will be fired.  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  ?something else might be a better fit for her. She also needs to do radioactive iodine for hyperthyroidismsoon, but needs to find the time to do this with her job. She will have to be out of work for 4 days after the treatment.   Patient Active Problem List   Diagnosis Date Noted  . Hyperthyroidism 01/20/2014  . PCOS (polycystic ovarian syndrome) 12/13/2013  . Cellulitis of left lower extremity 12/03/2013  . Cellulitis 12/03/2013  . Lower extremity edema 12/03/2013  . Obesity 10/08/2012  . Goiter 06/27/2012  . Constipation 06/27/2012    Past Medical History  Diagnosis Date  . Thyroid disease   . Allergy   . Anemia     Past Surgical History   Procedure Laterality Date  . Colposcopy      History  Substance Use Topics  . Smoking status: Never Smoker   . Smokeless tobacco: Not on file  . Alcohol Use: No    Family History  Problem Relation Age of Onset  . Stroke Mother   . Leukemia Other     Allergies  Allergen Reactions  . Amoxicillin Swelling    Tightness in throat  . Cleocin [Clindamycin Hcl]     hives    Medication list has been reviewed and updated.  Current Outpatient Prescriptions on File Prior to Visit  Medication Sig Dispense Refill  . EPINEPHrine (EPIPEN) 0.3 mg/0.3 mL SOAJ injection Inject 0.3 mLs (0.3 mg total) into the muscle once.  2 Device  2  . hydrochlorothiazide (HYDRODIURIL) 25 MG tablet Take 0.5 tablets (12.5 mg total) by mouth 2 (two) times daily.  30 tablet  3  . metFORMIN (GLUCOPHAGE) 500 MG tablet Take 500 mg by mouth 2 (two) times daily with a meal.       . norethindrone (AYGESTIN) 5 MG tablet Take 10 mg by mouth daily.       Marland Kitchen ALPRAZolam (XANAX) 0.25 MG tablet Take 1 tablet (0.25 mg total) by mouth 2 (two) times daily as needed for anxiety.  20 tablet  0   No current facility-administered medications on file prior to visit.    Review of Systems:  As per HPI- otherwise negative.   Physical Examination: Filed Vitals:   02/21/14 1550  BP: 152/65  Pulse: 99  Temp: 98.7 F (37.1 C)  Resp: 18   Filed Vitals:   02/21/14 1550  Weight: 327 lb (148.326 kg)   Body mass index is 50.43 kg/(m^2). Ideal Body Weight:    Wt Readings from Last 3 Encounters:  02/21/14 327 lb (148.326 kg)  01/24/14 323 lb (146.512 kg)  01/19/14 324 lb (146.965 kg)    GEN: WDWN, NAD, Non-toxic, A & O x 3, obese, looks well HEENT: Atraumatic, Normocephalic. Neck supple. No masses, No LAD.  Bilateral TM wnl, oropharynx normal and benign.  PEERL,EOMI.  Ears and Nose: No external deformity. CV: RRR, No M/G/R. No JVD. No thrill. No extra heart sounds. PULM: CTA B, no wheezes, crackles, rhonchi. No  retractions. No resp. distress. No accessory muscle use. ABD: S, NT, ND, +BS. No rebound. No HSM. EXTR: No c/c/e NEURO minimal stiffness of her legs PSYCH: Normally interactive. Conversant. Not depressed or anxious appearing.  Calm demeanor.  Lower extremity: she has mild edema of her bilateral LE but certainly not worse than she has been.  There is just minimal pinkness of the left anterior shin/ ankle area; per her report this is better over the last couple of days    Results for orders placed in visit on 02/21/14  POCT CBC      Result Value Ref Range   WBC 10.3 (*) 4.6 - 10.2 K/uL   Lymph, poc 2.0  0.6 - 3.4   POC LYMPH PERCENT 19.5  10 - 50 %L   MID (cbc) 0.5  0 - 0.9   POC MID % 4.9  0 - 12 %M   POC Granulocyte 7.8 (*) 2 - 6.9   Granulocyte percent 75.6  37 - 80 %G   RBC 5.07  4.04 - 5.48 M/uL   Hemoglobin 13.7  12.2 - 16.2 g/dL   HCT, POC 16.143.3  09.637.7 - 47.9 %   MCV 85.5  80 - 97 fL   MCH, POC 27.0  27 - 31.2 pg   MCHC 31.6 (*) 31.8 - 35.4 g/dL   RDW, POC 04.514.7     Platelet Count, POC 382  142 - 424 K/uL   MPV 9.7  0 - 99.8 fL    Assessment and Plan: Cellulitis of leg, left - Plan: POCT CBC, doxycycline (VIBRAMYCIN) 100 MG capsule  Bilateral leg edema - Plan: Comprehensive metabolic panel, hydrochlorothiazide (HYDRODIURIL) 25 MG tablet  Environmental allergies - Plan: Allergen food profile specific IgE, [place] allergy panel  Cellulitis of legs: gave an rx for doxycycline 100 BID to hold.  She is understandably concerned about being back on abx again, and thinks that she may be getting better.  She will hold the rx and use if she does not continue to get back to normal.   She may take up to 37.5 mg of HCTZ daily as needed for her edema.  She may adjust her dose as needed She states she and her husband are not sexually active right no so there is no risk of pregnancy  She has seemed to have a lot of allergies recently, manifested as hives and skin rashes.  Will perform allergy  testing by immunocap.  Many of her sx seem to be triggered by being at work-  she wonders if she may be allergic to the drugs of abuse that she works with at her job.   Signed Abbe Amsterdam, MD

## 2014-02-22 LAB — ALLERGEN FOOD PROFILE SPECIFIC IGE
Apple: <0.1 kU/L
Chicken IgE: <0.1 kU/L
Corn: <0.1 kU/L
Fish Cod: <0.1 kU/L
IgE (Immunoglobulin E), Serum: 14.5 IU/mL (ref 0.0–180.0)
Shrimp IgE: <0.1 kU/L
Soybean IgE: <0.1 kU/L
Wheat IgE: <0.1 kU/L

## 2014-02-22 LAB — ~~LOC~~ ALLERGY PANEL
Allergen, Cedar tree, t12: <0.1 kU/L
Allergen, D pternoyssinus,d7: <0.1 kU/L
Allergen, Mulberry, t76: <0.1 kU/L
Alternaria Alternata: <0.1 kU/L
Aspergillus fumigatus, m3: <0.1 kU/L
Bahia Grass: <0.1 kU/L
Bermuda Grass: <0.1 kU/L
Box Elder IgE: <0.1 kU/L
Cat Dander: <0.1 kU/L
Cladosporium Herbarum: <0.1 kU/L
Dog Dander: <0.1 kU/L
Johnson Grass: <0.1 kU/L
Mucor Racemosus: <0.1 kU/L
Mugwort: <0.1 kU/L
Oak: <0.1 kU/L
Pecan/Hickory Tree IgE: <0.1 kU/L
Penicillium Notatum: <0.1 kU/L
Plantain: <0.1 kU/L
Stemphylium Botryosum: <0.1 kU/L
Sweet Gum: <0.1 kU/L

## 2014-02-28 ENCOUNTER — Ambulatory Visit (INDEPENDENT_AMBULATORY_CARE_PROVIDER_SITE_OTHER): Payer: 59 | Admitting: Family Medicine

## 2014-02-28 ENCOUNTER — Encounter: Payer: Self-pay | Admitting: Family Medicine

## 2014-02-28 VITALS — BP 136/74 | HR 99 | Temp 98.7°F | Resp 16 | Ht 67.5 in | Wt 325.8 lb

## 2014-02-28 DIAGNOSIS — L02419 Cutaneous abscess of limb, unspecified: Secondary | ICD-10-CM

## 2014-02-28 DIAGNOSIS — L03119 Cellulitis of unspecified part of limb: Secondary | ICD-10-CM

## 2014-02-28 NOTE — Progress Notes (Signed)
[facility] [address], [place] [address] [phone]  Date:  02/28/2014   Name:  Cassandra Levy   DOB:  09-Dec-1981   MRN:  [id number]  PCP:  Tally DueGUEST, CHRIS WARREN, MD    Chief Complaint: Follow-up   History of Present Illness:  Cassandra Levy is a 33 y.o. very pleasant female patient who presents with the following:  Here to follow-up her leg edema.  She was here recently- one week ago- with some increased redness mostly in her left LE.  Over the weekend she had kept her legs elevated, and thought she was doing ok.  However she is now back at work.   Patient Active Problem List   Diagnosis Date Noted  . Hyperthyroidism 01/20/2014  . PCOS (polycystic ovarian syndrome) 12/13/2013  . Cellulitis of left lower extremity 12/03/2013  . Cellulitis 12/03/2013  . Lower extremity edema 12/03/2013  . Obesity 10/08/2012  . Goiter 06/27/2012  . Constipation 06/27/2012    Past Medical History  Diagnosis Date  . Thyroid disease   . Allergy   . Anemia     Past Surgical History  Procedure Laterality Date  . Colposcopy      History  Substance Use Topics  . Smoking status: Never Smoker   . Smokeless tobacco: Not on file  . Alcohol Use: No    Family History  Problem Relation Age of Onset  . Stroke Mother   . Leukemia Other     Allergies  Allergen Reactions  . Amoxicillin Swelling    Tightness in throat  . Cleocin [Clindamycin Hcl]     hives    Medication list has been reviewed and updated.  Current Outpatient Prescriptions on File Prior to Visit  Medication Sig Dispense Refill  . EPINEPHrine (EPIPEN) 0.3 mg/0.3 mL SOAJ injection Inject 0.3 mLs (0.3 mg total) into the muscle once.  2 Device  2  . hydrochlorothiazide (HYDRODIURIL) 25 MG tablet Take 12.5 mg up to TID depending on your sympotms  60 tablet  3  . metFORMIN (GLUCOPHAGE) 500 MG tablet Take 500 mg by mouth 2 (two) times daily with a meal.       . norethindrone (AYGESTIN) 5 MG  tablet Take 10 mg by mouth daily.       Marland Kitchen. ALPRAZolam (XANAX) 0.25 MG tablet Take 1 tablet (0.25 mg total) by mouth 2 (two) times daily as needed for anxiety.  20 tablet  0  . doxycycline (VIBRAMYCIN) 100 MG capsule Take 1 capsule (100 mg total) by mouth 2 (two) times daily.  40 capsule  0   No current facility-administered medications on file prior to visit.    Review of Systems:  aortic stenosis   Physical Examination: Filed Vitals:   02/28/14 1201  BP: 136/74  Pulse: 99  Temp: 98.7 F (37.1 C)  Resp: 16   Filed Vitals:   02/28/14 1201  Height: 5' 7.5" (1.715 m)  Weight: 325 lb 12.8 oz (147.782 kg)   Body mass index is 50.25 kg/(m^2). Ideal Body Weight: Weight in (lb) to have BMI = 25: 161.7 . GEN: WDWN, NAD, Non-toxic, A & O x 3 HEENT: Atraumatic, Normocephalic. Neck supple. No masses, No LAD. Ears and Nose: No external deformity. CV: RRR, No M/G/R. No JVD. No thrill. No extra heart sounds. PULM: CTA B, no wheezes, crackles, rhonchi. No retractions. No resp. distress. No accessory muscle use. EXTR: No c/c/e NEURO Normal gait.  PSYCH: Normally interactive. Conversant. Not  depressed or anxious appearing.  Calm demeanor.  LE: she has a slight amount of redness especially at the left ankle.  No heat, minimal swelling  Assessment and Plan: Cellulitis of lower extremity  She will go back on doxycycline for 7- 10 days to stop worsening of her cellulitis.   Referral to allergist for further evaluation.  She will let me know if any problems in the meantime.      Signed Abbe Amsterdam, MD

## 2014-02-28 NOTE — Patient Instructions (Signed)
We will be in touch with you regarding your allergist appointment.   Use the doxycycline twice a day for 7- 10 days; let me know if you are not getting better.

## 2014-03-07 ENCOUNTER — Telehealth: Payer: Self-pay

## 2014-03-07 DIAGNOSIS — T7840XA Allergy, unspecified, initial encounter: Secondary | ICD-10-CM

## 2014-03-07 NOTE — Telephone Encounter (Signed)
Patient was seen by Dr. Patsy Lageropland and is needing a better detailed letter for work; letter needs to say work restrictions, light duty and when she needs to follow up with Dr. Patsy Lageropland. Please call when letter is ready for pick up. Patient would like it by tonight if possible.   Best: [phone]

## 2014-03-07 NOTE — Telephone Encounter (Signed)
Called her- she needs a more detailed work note but is not really sure what it needs to say.  Also, she needs a referral to allergist at a tertiary care center for the type of allergy testing that she needs.  Will do both these things for her

## 2014-03-07 NOTE — Telephone Encounter (Signed)
Please advise what her restrictions should be for work and when you would like her to come back in to follow up.

## 2014-03-21 ENCOUNTER — Encounter: Payer: Self-pay | Admitting: Family Medicine

## 2014-03-21 ENCOUNTER — Ambulatory Visit (INDEPENDENT_AMBULATORY_CARE_PROVIDER_SITE_OTHER): Payer: 59 | Admitting: Family Medicine

## 2014-03-21 VITALS — BP 138/80 | HR 82 | Temp 98.1°F | Resp 18 | Ht 68.0 in | Wt 327.0 lb

## 2014-03-21 DIAGNOSIS — L02818 Cutaneous abscess of other sites: Secondary | ICD-10-CM

## 2014-03-21 DIAGNOSIS — L03818 Cellulitis of other sites: Secondary | ICD-10-CM

## 2014-03-21 DIAGNOSIS — L03119 Cellulitis of unspecified part of limb: Secondary | ICD-10-CM

## 2014-03-21 DIAGNOSIS — J309 Allergic rhinitis, unspecified: Secondary | ICD-10-CM

## 2014-03-21 NOTE — Progress Notes (Signed)
[facility] [address], [place] [address] [phone]  Date:  03/21/2014   Name:  Cassandra Levy   DOB:  [birth year]   MRN:  [id number]  PCP:  Tally DueGUEST, CHRIS WARREN, MD    Chief Complaint: Follow-up   History of Present Illness:  Cassandra Levy is a 33 y.o. very pleasant female patient who presents with the following:  Here today to recheck cellulitis of her BLE.  She was hospitalized before Christmas with severe cellulites and has struggled with recurrent redness and swelling since then.  Last seen here on 02/28/14.  She also has Graves disease and is waiting on a time when she can do radioactive iodine therapy.    She did apply for a new job, but currently is still doing her current job.  She is not able to quit until a new job is available for her.   [facility]  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    We think that her original cellulitis was due to wearing too tight boots and scratchy tight uniform pants that caused skin breakdown.  She needs a note detailing exactly what she can wear to work.  She is taking hctz for swelling- 25 am and 12.5 pm.  Recent labs looked fine.    She has also noted some sinus/ nasal itching and congestion.  Had stopped her claritin as she was undergoing allergy testing.  No fever, otherwise feels ok.   Wt Readings from Last 3 Encounters:  03/21/14 327 lb (148.326 kg)  02/28/14 325 lb 12.8 oz (147.782 kg)  02/21/14 327 lb (148.326 kg)    Patient Active Problem List   Diagnosis Date Noted  . Hyperthyroidism 01/20/2014  . PCOS (polycystic ovarian syndrome) 12/13/2013  . Cellulitis of left lower extremity 12/03/2013  . Cellulitis 12/03/2013  . Lower extremity edema 12/03/2013  . Obesity 10/08/2012  . Goiter 06/27/2012  . Constipation 06/27/2012    Past Medical History  Diagnosis Date  . Thyroid disease   .  Allergy   . Anemia     Past Surgical History  Procedure Laterality Date  . Colposcopy      History  Substance Use Topics  . Smoking status: Never Smoker   . Smokeless tobacco: Not on file  . Alcohol Use: No    Family History  Problem Relation Age of Onset  . Stroke Mother   . Leukemia Other     Allergies  Allergen Reactions  . Amoxicillin Swelling    Tightness in throat  . Cleocin [Clindamycin Hcl]     hives    Medication list has been reviewed and updated.  Current Outpatient Prescriptions on File Prior to Visit  Medication Sig Dispense Refill  . doxycycline (VIBRAMYCIN) 100 MG capsule Take 1 capsule (100 mg total) by mouth 2 (two) times daily.  40 capsule  0  . EPINEPHrine (EPIPEN) 0.3 mg/0.3 mL SOAJ injection Inject 0.3 mLs (0.3 mg total) into the muscle once.  2 Device  2  . hydrochlorothiazide (HYDRODIURIL) 25 MG tablet Take 12.5 mg up to TID depending on your sympotms  60 tablet  3  . metFORMIN (GLUCOPHAGE) 500 MG tablet Take 500 mg by mouth 2 (two) times daily with a meal.       . norethindrone (AYGESTIN) 5 MG tablet Take 10 mg by mouth daily.  No current facility-administered medications on file prior to visit.    Review of Systems:  As per HPI- otherwise negative.   Physical Examination: Filed Vitals:   03/21/14 1202  BP: 138/80  Pulse: 100  Temp: 98.1 F (36.7 C)  Resp: 18   Filed Vitals:   03/21/14 1202  Height: 5\' 8"  (1.727 m)  Weight: 327 lb (148.326 kg)   Body mass index is 49.73 kg/(m^2). Ideal Body Weight: Weight in (lb) to have BMI = 25: 164.1  GEN: WDWN, NAD, Non-toxic, A & O x 3, obese HEENT: Atraumatic, Normocephalic. Neck supple. No masses, No LAD.  Nasal congestion, oropharynx wnl Ears and Nose: No external deformity. CV: RRR, No M/G/R. No JVD. No thrill. No extra heart sounds. PULM: CTA B, no wheezes, crackles, rhonchi. No retractions. No resp. distress. No accessory muscle use. EXTR: No c/c/e NEURO Normal gait.   PSYCH: Normally interactive. Conversant. Not depressed or anxious appearing.  Calm demeanor.  Her LE look good today- she has trace edema, no redness or active cellulitis.  Looks better than she has since December.    Assessment and Plan:  Cellulitis, leg  Allergic rhinitis  Her cellulitis looks better than it has in some time!  Gave a note for her job detailing the clothes she may wear, etc.   Encouraged her to go back on her claritin, and let me know if not better, Sooner if worse.   Plan to follow-up in about 2 months.     Signed Abbe Amsterdam, MD

## 2014-03-21 NOTE — Patient Instructions (Signed)
Good to see you today!  Let's plan to see you and recheck labs in a couple of months.

## 2014-04-15 ENCOUNTER — Ambulatory Visit (INDEPENDENT_AMBULATORY_CARE_PROVIDER_SITE_OTHER): Payer: 59 | Admitting: Family Medicine

## 2014-04-15 VITALS — BP 136/78 | HR 89 | Temp 98.1°F | Resp 16 | Ht 65.1 in | Wt 328.6 lb

## 2014-04-15 DIAGNOSIS — T502X5A Adverse effect of carbonic-anhydrase inhibitors, benzothiadiazides and other diuretics, initial encounter: Secondary | ICD-10-CM

## 2014-04-15 DIAGNOSIS — H9209 Otalgia, unspecified ear: Secondary | ICD-10-CM

## 2014-04-15 DIAGNOSIS — R609 Edema, unspecified: Secondary | ICD-10-CM

## 2014-04-15 LAB — BASIC METABOLIC PANEL
BUN: 13 mg/dL (ref 6–23)
CHLORIDE: 103 meq/L (ref 96–112)
CO2: 28 mEq/L (ref 19–32)
Calcium: 9.4 mg/dL (ref 8.4–10.5)
Creat: 0.76 mg/dL (ref 0.50–1.10)
Glucose, Bld: 110 mg/dL — ABNORMAL HIGH (ref 70–99)
POTASSIUM: 4 meq/L (ref 3.5–5.3)
Sodium: 138 mEq/L (ref 135–145)

## 2014-04-15 MED ORDER — ANTIPYRINE-BENZOCAINE 5.4-1.4 % OT SOLN: 3.0000 [drp] | OTIC | Status: DC | PRN

## 2014-04-15 NOTE — Patient Instructions (Signed)
Use the ear drops as needed for pain- let me know if you are not feeling better in the next couple of days.  I will be in touch with today's labs. You might try a decongestant and/ or mucinex as well

## 2014-04-15 NOTE — Progress Notes (Signed)
[facility] [address], [place] [id number] [phone]  Date:  04/15/2014   Name:  Cassandra Levy   DOB:  1981-01-15   MRN:  [id number]  PCP:  Tally DueGUEST, CHRIS WARREN, MD    Chief Complaint: Otalgia and Sinusitis   History of Present Illness:  Cassandra Levy is a 33 y.o. very pleasant female patient who presents with the following:  She is here today with a concern about her left ear.  She notes that he ear feels like it "needs to pop", it is slightly painful and ringing some.  She is currently holding her usual allergy medications for an upcoming allergist appt.   The right ear is popping a little bit, but is not bad.   Overall she is doing fairly well currently.  Her LE edema is under control with no sign of cellulitis    Patient Active Problem List   Diagnosis Date Noted  . Hyperthyroidism 01/20/2014  . PCOS (polycystic ovarian syndrome) 12/13/2013  . Cellulitis of left lower extremity 12/03/2013  . Cellulitis 12/03/2013  . Lower extremity edema 12/03/2013  . Obesity 10/08/2012  . Goiter 06/27/2012  . Constipation 06/27/2012    Past Medical History  Diagnosis Date  . Thyroid disease   . Allergy   . Anemia     Past Surgical History  Procedure Laterality Date  . Colposcopy      History  Substance Use Topics  . Smoking status: Never Smoker   . Smokeless tobacco: Not on file  . Alcohol Use: No    Family History  Problem Relation Age of Onset  . Stroke Mother   . Leukemia Other     Allergies  Allergen Reactions  . Amoxicillin Swelling    Tightness in throat  . Cleocin [Clindamycin Hcl]     hives    Medication list has been reviewed and updated.  Current Outpatient Prescriptions on File Prior to Visit  Medication Sig Dispense Refill  . doxycycline (VIBRAMYCIN) 100 MG capsule Take 1 capsule (100 mg total) by mouth 2 (two) times daily.  40 capsule  0  . EPINEPHrine (EPIPEN) 0.3 mg/0.3 mL SOAJ injection Inject 0.3 mLs (0.3  mg total) into the muscle once.  2 Device  2  . hydrochlorothiazide (HYDRODIURIL) 25 MG tablet Take 12.5 mg up to TID depending on your sympotms  60 tablet  3  . metFORMIN (GLUCOPHAGE) 500 MG tablet Take 500 mg by mouth 2 (two) times daily with a meal.       . norethindrone (AYGESTIN) 5 MG tablet Take 10 mg by mouth daily.        No current facility-administered medications on file prior to visit.    Review of Systems:  As per HPI- otherwise negative.   Physical Examination: Filed Vitals:   04/15/14 1448  BP: 136/78  Pulse: 89  Temp: 98.1 F (36.7 C)  Resp: 16   Filed Vitals:   04/15/14 1448  Height: 5' 5.1" (1.654 m)  Weight: 328 lb 9.6 oz (149.052 kg)   Body mass index is 54.48 kg/(m^2). Ideal Body Weight: Weight in (lb) to have BMI = 25: 150.4  GEN: WDWN, NAD, Non-toxic, A & O x 3, obese, looks well HEENT: Atraumatic, Normocephalic. Neck supple. No masses, No LAD.  Bilateral TM and EAC wnl, oropharynx normal.  PEERL,EOMI.   No evidence of AOM or OE Ears and Nose: No external deformity. CV: RRR, No M/G/R. No JVD. No thrill. No  extra heart sounds. PULM: CTA B, no wheezes, crackles, rhonchi. No retractions. No resp. distress. No accessory muscle use. EXTR: No c/c/e NEURO Normal gait.  PSYCH: Normally interactive. Conversant. Not depressed or anxious appearing.  Calm demeanor.    Assessment and Plan: Ear pain - Plan: antipyrine-benzocaine (AURALGAN) otic solution  Diuretics causing adverse effect in therapeutic use - Plan: Basic metabolic panel  Suspect her ear discomfort is caused by holding her allergy meds.  Try AB otic as needed and let me know if not better soon.  Ok to use some mucinex Check BMP as she continues to use some HCTZ as needed for her LE edema Will plan further follow- up pending labs.    Signed Abbe AmsterdamJessica Copland, MD

## 2014-05-22 ENCOUNTER — Ambulatory Visit (INDEPENDENT_AMBULATORY_CARE_PROVIDER_SITE_OTHER): Payer: 59 | Admitting: Family Medicine

## 2014-05-22 VITALS — BP 148/82 | HR 112 | Temp 97.9°F | Resp 20 | Ht 68.0 in | Wt 335.0 lb

## 2014-05-22 DIAGNOSIS — S41152A Open bite of left upper arm, initial encounter: Secondary | ICD-10-CM

## 2014-05-22 DIAGNOSIS — Z23 Encounter for immunization: Secondary | ICD-10-CM

## 2014-05-22 DIAGNOSIS — S41109A Unspecified open wound of unspecified upper arm, initial encounter: Secondary | ICD-10-CM

## 2014-05-22 DIAGNOSIS — W540XXA Bitten by dog, initial encounter: Secondary | ICD-10-CM

## 2014-05-22 NOTE — Patient Instructions (Signed)
Due to your antibiotic allergies and the fact this was a dog bite, we can hold on antibiotics at this time.  Soap and water cleansing twice per day, polysporin or neosporin if you have used this without difficulty in the past, keep wounds clean and covered as discussed and if any increased redness from wound, drainage or fever - return here or emergency room for recheck.   Animal Bite An animal bite can result in a scratch on the skin, deep open cut, puncture of the skin, crush injury, or tearing away of the skin or a body part. Dogs are responsible for most animal bites. Children are bitten more often than adults. An animal bite can range from very mild to more serious. A small bite from your house pet is no cause for alarm. However, some animal bites can become infected or injure a bone or other tissue. You must seek medical care if:  The skin is broken and bleeding does not slow down or stop after 15 minutes.  The puncture is deep and difficult to clean (such as a cat bite).  Pain, warmth, redness, or pus develops around the wound.  The bite is from a stray animal or rodent. There may be a risk of rabies infection.  The bite is from a snake, raccoon, skunk, fox, coyote, or bat. There may be a risk of rabies infection.  The person bitten has a chronic illness such as diabetes, liver disease, or cancer, or the person takes medicine that lowers the immune system.  There is concern about the location and severity of the bite. It is important to clean and protect an animal bite wound right away to prevent infection. Follow these steps:  Clean the wound with plenty of water and soap.  Apply an antibiotic cream.  Apply gentle pressure over the wound with a clean towel or gauze to slow or stop bleeding.  Elevate the affected area above the heart to help stop any bleeding.  Seek medical care. Getting medical care within 8 hours of the animal bite leads to the best possible outcome. DIAGNOSIS   Your caregiver will most likely:  Take a detailed history of the animal and the bite injury.  Perform a wound exam.  Take your medical history. Blood tests or X-rays may be performed. Sometimes, infected bite wounds are cultured and sent to a lab to identify the infectious bacteria.  TREATMENT  Medical treatment will depend on the location and type of animal bite as well as the patient's medical history. Treatment may include:  Wound care, such as cleaning and flushing the wound with saline solution, bandaging, and elevating the affected area.  Antibiotics.  Tetanus immunization.  Rabies immunization.  Leaving the wound open to heal. This is often done with animal bites, due to the high risk of infection. However, in certain cases, wound closure with stitches, wound adhesive, skin adhesive strips, or staples may be used. Infected bites that are left untreated may require intravenous (IV) antibiotics and surgical treatment in the hospital. HOME CARE INSTRUCTIONS  Follow your caregiver's instructions for wound care.  Take all medicines as directed.  If your caregiver prescribes antibiotics, take them as directed. Finish them even if you start to feel better.  Follow up with your caregiver for further exams or immunizations as directed. You may need a tetanus shot if:  You cannot remember when you had your last tetanus shot.  You have never had a tetanus shot.  The injury broke your  skin. If you get a tetanus shot, your arm may swell, get red, and feel warm to the touch. This is common and not a problem. If you need a tetanus shot and you choose not to have one, there is a rare chance of getting tetanus. Sickness from tetanus can be serious. SEEK MEDICAL CARE IF:  You notice warmth, redness, soreness, swelling, pus discharge, or a bad smell coming from the wound.  You have a red line on the skin coming from the wound.  You have a fever, chills, or a general ill  feeling.  You have nausea or vomiting.  You have continued or worsening pain.  You have trouble moving the injured part.  You have other questions or concerns. MAKE SURE YOU:  Understand these instructions.  Will watch your condition.  Will get help right away if you are not doing well or get worse. Document Released: 08/20/2011 Document Revised: 02/24/2012 Document Reviewed: 08/20/2011 [facility] Patient Information 2014 [employer], [place].

## 2014-05-22 NOTE — Progress Notes (Addendum)
Subjective:    Patient ID: Cassandra Levy, female    DOB: [birth year], 33 y.o.   MRN: [id number]  Animal Bite    Chief Complaint  Patient presents with  . Animal Bite    dog bite to left wrist.  pt stated that this was her dog and the dog is up to date on her shots.  pt is unsure of her last tdap.    This chart was scribed for Meredith Staggers, MD by Andrew Au, ED Scribe. This patient was seen in room 6 and the patient's care was started at 11:53 AM.  HPI Comments: Cassandra Levy is a 33 y.o. female who [facility] complaining of dog bite to left wrist x PTA. Pt states she was playing with her 33 y.o mixed breed dog when she bit her. Pt reports dog may not have been feeling well lately, but no illness. She states dog is UTD on shots.  Pt put pressure to wound to control bleeding and used peroxide to clean wound. Pt is unable to recall last TDAP. Pt denies any other injury.   Patient Active Problem List   Diagnosis Date Noted  . Hyperthyroidism 01/20/2014  . PCOS (polycystic ovarian syndrome) 12/13/2013  . Cellulitis of left lower extremity 12/03/2013  . Cellulitis 12/03/2013  . Lower extremity edema 12/03/2013  . Obesity 10/08/2012  . Goiter 06/27/2012  . Constipation 06/27/2012   Past Medical History  Diagnosis Date  . Thyroid disease   . Allergy   . Anemia    Past Surgical History  Procedure Laterality Date  . Colposcopy     Allergies  Allergen Reactions  . Amoxicillin Swelling    Tightness in throat  . Cleocin [Clindamycin Hcl]     hives   Prior to Admission medications   Medication Sig Start Date End Date Taking? Authorizing Provider  EPINEPHrine (EPIPEN) 0.3 mg/0.3 mL SOAJ injection Inject 0.3 mLs (0.3 mg total) into the muscle once. 12/09/13  Yes Gwenlyn Found Copland, MD  hydrochlorothiazide (HYDRODIURIL) 25 MG tablet Take 12.5 mg up to TID depending on your sympotms 02/21/14  Yes Gwenlyn Found Copland, MD  metFORMIN  (GLUCOPHAGE) 500 MG tablet Take 500 mg by mouth 2 (two) times daily with a meal.    Yes Historical Provider, MD  norethindrone (AYGESTIN) 5 MG tablet Take 10 mg by mouth daily.    Yes Historical Provider, MD  antipyrine-benzocaine Lyla Son) otic solution Place 3-4 drops into the left ear every 2 (two) hours as needed for ear pain. 04/15/14   Pearline Cables, MD  doxycycline (VIBRAMYCIN) 100 MG capsule Take 1 capsule (100 mg total) by mouth 2 (two) times daily. 02/21/14   Pearline Cables, MD   History   Social History  . Marital Status: Married    Spouse Name: N/A    Number of Children: N/A  . Years of Education: N/A   Occupational History  . Not on file.   Social History Main Topics  . Smoking status: Never Smoker   . Smokeless tobacco: Not on file  . Alcohol Use: No  . Drug Use: Not on file  . Sexual Activity: Not on file   Other Topics Concern  . Not on file   Social History Narrative  . No narrative on file   Review of Systems  Constitutional: Negative for fever and chills.  Skin: Positive for wound.      Objective:   Physical Exam  Nursing note and vitals reviewed. Constitutional: She is oriented to person, place, and time. She appears well-developed and well-nourished. No distress.  HENT:  Head: Normocephalic and atraumatic.  Eyes: Conjunctivae and EOM are normal.  Neck: Normal range of motion. No tracheal deviation present.  Cardiovascular: Normal rate.   Pulmonary/Chest: Effort normal. No respiratory distress.  Musculoskeletal: Normal range of motion.  Full strength   Neurological: She is alert and oriented to person, place, and time.  Skin: Skin is warm and dry.  2 puncture wounds on ulnar aspect of left wrist 1-1.5cm abrated area Distal bruising  1-2 cm soft tissue swelling No active bleeding  Psychiatric: She has a normal mood and affect. Her behavior is normal.      Assessment & Plan:    Cassandra Levy is a 33 y.o. female Dog bite of left arm  - Plan: Tdap vaccine greater than or equal to 7yo IM  Arm wound - Plan: Tdap vaccine greater than or equal to 7yo IM  Abrasion, contusion with 2 small puncture type wounds on forearm.  Some erythema a few cm around wound from injury, but no signs of infection seen, and no wounds requiring repair.  Discussed low chance of infection as dog bite. Additionally has allergies to typical recommended treatments for dog bites, so will hold on antibiotics at this point with close observation for any s/sx of infection. rtc precautions discussed.   tdap given.    No orders of the defined types were placed in this encounter.   Patient Instructions  Due to your antibiotic allergies and the fact this was a dog bite, we can hold on antibiotics at this time.  Soap and water cleansing twice per day, polysporin or neosporin if you have used this without difficulty in the past, keep wounds clean and covered as discussed and if any increased redness from wound, drainage or fever - return here or emergency room for recheck.   Animal Bite An animal bite can result in a scratch on the skin, deep open cut, puncture of the skin, crush injury, or tearing away of the skin or a body part. Dogs are responsible for most animal bites. Children are bitten more often than adults. An animal bite can range from very mild to more serious. A small bite from your house pet is no cause for alarm. However, some animal bites can become infected or injure a bone or other tissue. You must seek medical care if:  The skin is broken and bleeding does not slow down or stop after 15 minutes.  The puncture is deep and difficult to clean (such as a cat bite).  Pain, warmth, redness, or pus develops around the wound.  The bite is from a stray animal or rodent. There may be a risk of rabies infection.  The bite is from a snake, raccoon, skunk, fox, coyote, or bat. There may be a risk of rabies infection.  The person bitten has a chronic  illness such as diabetes, liver disease, or cancer, or the person takes medicine that lowers the immune system.  There is concern about the location and severity of the bite. It is important to clean and protect an animal bite wound right away to prevent infection. Follow these steps:  Clean the wound with plenty of water and soap.  Apply an antibiotic cream.  Apply gentle pressure over the wound with a clean towel or gauze to slow or stop bleeding.  Elevate the affected area above the  heart to help stop any bleeding.  Seek medical care. Getting medical care within 8 hours of the animal bite leads to the best possible outcome. DIAGNOSIS  Your caregiver will most likely:  Take a detailed history of the animal and the bite injury.  Perform a wound exam.  Take your medical history. Blood tests or X-rays may be performed. Sometimes, infected bite wounds are cultured and sent to a lab to identify the infectious bacteria.  TREATMENT  Medical treatment will depend on the location and type of animal bite as well as the patient's medical history. Treatment may include:  Wound care, such as cleaning and flushing the wound with saline solution, bandaging, and elevating the affected area.  Antibiotics.  Tetanus immunization.  Rabies immunization.  Leaving the wound open to heal. This is often done with animal bites, due to the high risk of infection. However, in certain cases, wound closure with stitches, wound adhesive, skin adhesive strips, or staples may be used. Infected bites that are left untreated may require intravenous (IV) antibiotics and surgical treatment in the hospital. HOME CARE INSTRUCTIONS  Follow your caregiver's instructions for wound care.  Take all medicines as directed.  If your caregiver prescribes antibiotics, take them as directed. Finish them even if you start to feel better.  Follow up with your caregiver for further exams or immunizations as directed. You  may need a tetanus shot if:  You cannot remember when you had your last tetanus shot.  You have never had a tetanus shot.  The injury broke your skin. If you get a tetanus shot, your arm may swell, get red, and feel warm to the touch. This is common and not a problem. If you need a tetanus shot and you choose not to have one, there is a rare chance of getting tetanus. Sickness from tetanus can be serious. SEEK MEDICAL CARE IF:  You notice warmth, redness, soreness, swelling, pus discharge, or a bad smell coming from the wound.  You have a red line on the skin coming from the wound.  You have a fever, chills, or a general ill feeling.  You have nausea or vomiting.  You have continued or worsening pain.  You have trouble moving the injured part.  You have other questions or concerns. MAKE SURE YOU:  Understand these instructions.  Will watch your condition.  Will get help right away if you are not doing well or get worse. Document Released: 08/20/2011 Document Revised: 02/24/2012 Document Reviewed: 08/20/2011 [employer] Patient Information 2014 [place], [place].     I personally performed the services described in this documentation, which was scribed in my presence. The recorded information has been reviewed and considered, and addended by me as needed.

## 2014-05-29 ENCOUNTER — Ambulatory Visit (INDEPENDENT_AMBULATORY_CARE_PROVIDER_SITE_OTHER): Payer: 59 | Admitting: Family Medicine

## 2014-05-29 VITALS — BP 120/74 | HR 118 | Temp 98.2°F | Resp 18 | Ht 68.0 in | Wt 333.0 lb

## 2014-05-29 DIAGNOSIS — T148XXA Other injury of unspecified body region, initial encounter: Secondary | ICD-10-CM

## 2014-05-29 DIAGNOSIS — J329 Chronic sinusitis, unspecified: Secondary | ICD-10-CM

## 2014-05-29 DIAGNOSIS — J209 Acute bronchitis, unspecified: Secondary | ICD-10-CM

## 2014-05-29 DIAGNOSIS — W540XXA Bitten by dog, initial encounter: Secondary | ICD-10-CM

## 2014-05-29 MED ORDER — SILVER SULFADIAZINE 1 % EX CREA: 1.0000 "application " | TOPICAL_CREAM | Freq: Every day | CUTANEOUS | Status: DC

## 2014-05-29 MED ORDER — AZITHROMYCIN 250 MG PO TABS: ORAL_TABLET | ORAL | Status: DC

## 2014-05-29 MED ORDER — HYDROCODONE-HOMATROPINE 5-1.5 MG/5ML PO SYRP: 5.0000 mL | ORAL_SOLUTION | Freq: Three times a day (TID) | ORAL | Status: DC | PRN

## 2014-05-29 NOTE — Progress Notes (Signed)
Subjective:    Patient ID: Cassandra Levy, female    DOB: 08/09/1981, 33 y.o.   MRN: [id number]  Sore Throat  Associated symptoms include congestion and coughing.   Chief Complaint  Patient presents with   Sore Throat    stuffy head, congestion, cough, fever, sinus pressure in head x Wednesday     This chart was scribed for Elvina SidleKurt Lauenstein, MD by Andrew Auaven Small, ED Scribe. This patient was seen in room 5 and the patient's care was started at 8:46 AM.  HPI Comments: Cassandra Levy is a 33 y.o. female who [facility] complaining of a sore throat x 4 days ago with associated cough and congestion. Pt states symptoms started 4 days ago at work after working outside in the heat. She states she may of had heat exhaustion at that time and was exhausted afterwards. She reports 3 days ago she had drainage, congestion and sore throat. 2 days ago she started taking mucinex and crotamiton.pt states she usually take Claritin daily but has been unable to take it due to taking crotamiton. Pt denies h/o asthma.   Pt was seen 1 week ago by Dr. Neva SeatGreene for a dog bite.  Pt works at the [employer].   Patient Active Problem List   Diagnosis Date Noted   Hyperthyroidism 01/20/2014   PCOS (polycystic ovarian syndrome) 12/13/2013   Cellulitis of left lower extremity 12/03/2013   Cellulitis 12/03/2013   Lower extremity edema 12/03/2013   Obesity 10/08/2012   Goiter 06/27/2012   Constipation 06/27/2012   Past Medical History  Diagnosis Date   Thyroid disease    Allergy    Anemia    Allergies  Allergen Reactions   Amoxicillin Swelling    Tightness in throat   Cleocin [Clindamycin Hcl]     hives   Prior to Admission medications   Medication Sig Start Date End Date Taking? Authorizing Provider  antipyrine-benzocaine Lyla Son(AURALGAN) otic solution Place 3-4 drops into the left ear every 2 (two) hours as needed for ear pain. 04/15/14  Yes Gwenlyn FoundJessica  C Copland, MD  doxycycline (VIBRAMYCIN) 100 MG capsule Take 1 capsule (100 mg total) by mouth 2 (two) times daily. 02/21/14  Yes Gwenlyn FoundJessica C Copland, MD  EPINEPHrine (EPIPEN) 0.3 mg/0.3 mL SOAJ injection Inject 0.3 mLs (0.3 mg total) into the muscle once. 12/09/13  Yes Gwenlyn FoundJessica C Copland, MD  hydrochlorothiazide (HYDRODIURIL) 25 MG tablet Take 12.5 mg up to TID depending on your sympotms 02/21/14  Yes Gwenlyn FoundJessica C Copland, MD  metFORMIN (GLUCOPHAGE) 500 MG tablet Take 500 mg by mouth 2 (two) times daily with a meal.    Yes Historical Provider, MD  norethindrone (AYGESTIN) 5 MG tablet Take 10 mg by mouth daily.    Yes Historical Provider, MD   Review of Systems  HENT: Positive for congestion, sinus pressure and sore throat.   Respiratory: Positive for cough.    Objective:   Physical Exam  Nursing note and vitals reviewed. Constitutional: She is oriented to person, place, and time. She appears well-developed and well-nourished. No distress.  HENT:  Head: Normocephalic and atraumatic.  Eyes: Conjunctivae and EOM are normal.  Neck: Normal range of motion.  Cardiovascular: Normal rate.   Pulmonary/Chest: Effort normal.  Musculoskeletal: Normal range of motion.  Neurological: She is alert and oriented to person, place, and time.  Skin: Skin is warm and dry.  Psychiatric: She has a normal mood and affect. Her behavior is normal.  Assessment & Plan:   1. Dog bite   2. Acute bronchitis   3. Sinusitis    Meds ordered this encounter  Medications   HYDROcodone-homatropine (HYCODAN) 5-1.5 MG/5ML syrup    Sig: Take 5 mLs by mouth every 8 (eight) hours as needed for cough.    Dispense:  120 mL    Refill:  0   silver sulfADIAZINE (SILVADENE) 1 % cream    Sig: Apply 1 application topically daily.    Dispense:  50 g    Refill:  0   azithromycin (ZITHROMAX Z-PAK) 250 MG tablet    Sig: Take as directed on pack    Dispense:  6 tablet    Refill:  0   Elvina SidleKurt Lauenstein, MD

## 2014-06-22 ENCOUNTER — Ambulatory Visit (INDEPENDENT_AMBULATORY_CARE_PROVIDER_SITE_OTHER): Payer: 59 | Admitting: Emergency Medicine

## 2014-06-22 VITALS — BP 130/68 | HR 101 | Temp 97.9°F | Resp 18 | Ht 68.0 in | Wt 332.0 lb

## 2014-06-22 DIAGNOSIS — S335XXA Sprain of ligaments of lumbar spine, initial encounter: Secondary | ICD-10-CM

## 2014-06-22 MED ORDER — CYCLOBENZAPRINE HCL 10 MG PO TABS: 10.0000 mg | ORAL_TABLET | Freq: Three times a day (TID) | ORAL | Status: DC | PRN

## 2014-06-22 MED ORDER — NAPROXEN SODIUM 550 MG PO TABS: 550.0000 mg | ORAL_TABLET | Freq: Two times a day (BID) | ORAL | Status: DC

## 2014-06-22 NOTE — Patient Instructions (Signed)

## 2014-06-22 NOTE — Progress Notes (Signed)
[facility] [address], [place] [id number] [phone]  Date:  06/22/2014   Name:  Cassandra CopaJessica L Levy   DOB:  14-Oct-1981   MRN:  [id number]  PCP:  Tally DueGUEST, CHRIS WARREN, MD    Chief Complaint: Back Pain   History of Present Illness:  Cassandra Levy is a 33 y.o. very pleasant female patient who presents with the following:  Injured yesterday while doing housework last night and has pain in low back. Not radiating and no neuro symptoms.  Was able to work with no adverse effects.  No improvement with over the counter medications or other home remedies. Denies other complaint or health concern today.   Patient Active Problem List   Diagnosis Date Noted  . Hyperthyroidism 01/20/2014  . PCOS (polycystic ovarian syndrome) 12/13/2013  . Cellulitis of left lower extremity 12/03/2013  . Cellulitis 12/03/2013  . Lower extremity edema 12/03/2013  . Obesity 10/08/2012  . Goiter 06/27/2012  . Constipation 06/27/2012    Past Medical History  Diagnosis Date  . Thyroid disease   . Allergy   . Anemia     Past Surgical History  Procedure Laterality Date  . Colposcopy      History  Substance Use Topics  . Smoking status: Never Smoker   . Smokeless tobacco: Not on file  . Alcohol Use: No    Family History  Problem Relation Age of Onset  . Stroke Mother   . Leukemia Other     Allergies  Allergen Reactions  . Amoxicillin Swelling    Tightness in throat  . Cleocin [Clindamycin Hcl]     hives    Medication list has been reviewed and updated.  Current Outpatient Prescriptions on File Prior to Visit  Medication Sig Dispense Refill  . EPINEPHrine (EPIPEN) 0.3 mg/0.3 mL SOAJ injection Inject 0.3 mLs (0.3 mg total) into the muscle once.  2 Device  2  . hydrochlorothiazide (HYDRODIURIL) 25 MG tablet Take 12.5 mg up to TID depending on your sympotms  60 tablet  3  . metFORMIN (GLUCOPHAGE) 500 MG tablet Take 500 mg by mouth 2 (two) times daily with a  meal.       . norethindrone (AYGESTIN) 5 MG tablet Take 10 mg by mouth daily.       Marland Kitchen. antipyrine-benzocaine (AURALGAN) otic solution Place 3-4 drops into the left ear every 2 (two) hours as needed for ear pain.  10 mL  0   No current facility-administered medications on file prior to visit.    Review of Systems:  As per HPI, otherwise negative.    Physical Examination: Filed Vitals:   06/22/14 1657  BP: 130/68  Pulse: 101  Temp: 97.9 F (36.6 C)  Resp: 18   Filed Vitals:   06/22/14 1657  Height: 5\' 8"  (1.727 m)  Weight: 332 lb (150.594 kg)   Body mass index is 50.49 kg/(m^2). Ideal Body Weight: Weight in (lb) to have BMI = 25: 164.1   GEN: morbid obesity, NAD, Non-toxic, Alert & Oriented x 3 HEENT: Atraumatic, Normocephalic.  Ears and Nose: No external deformity. EXTR: No clubbing/cyanosis/edema NEURO: Normal gait.  PSYCH: Normally interactive. Conversant. Not depressed or anxious appearing.  Calm demeanor.  Back:  Tender lumbar paraspinous muscle.  Neuro intact  Assessment and Plan: Lumbar strain Anaprox Flexeril  Signed,  Phillips OdorJeffery Tashana Haberl, MD

## 2014-07-01 ENCOUNTER — Encounter: Payer: Self-pay | Admitting: Endocrinology

## 2014-07-01 ENCOUNTER — Ambulatory Visit (INDEPENDENT_AMBULATORY_CARE_PROVIDER_SITE_OTHER): Payer: 59 | Admitting: Endocrinology

## 2014-07-01 VITALS — BP 132/84 | HR 91 | Temp 97.0°F | Ht 68.0 in | Wt 332.0 lb

## 2014-07-01 DIAGNOSIS — E0590 Thyrotoxicosis, unspecified without thyrotoxic crisis or storm: Secondary | ICD-10-CM

## 2014-07-01 LAB — TSH: TSH: 0.1 u[IU]/mL — AB (ref 0.35–4.50)

## 2014-07-01 LAB — T4, FREE: FREE T4: 1.18 ng/dL (ref 0.60–1.60)

## 2014-07-01 NOTE — Patient Instructions (Addendum)
blood tests are being requested for you today.  We'll contact you with results. If it overactive, please consider the treatment options we discussed. The radioactive iodine treatment works like this: We would first check a thyroid "scan" (a special, but easy and painless type of thyroid x ray).  It works like this: you go to the x-ray department of the hospital to swallow a pill, which contains a miniscule amount of radiation.  You will not notice any symptoms from this.  You will go back to the x-ray department the next day, to lie down in front of a camera.  The results of this will be sent to me.   Based on the results, i hope to order for you a treatment pill of radioactive iodine.  Although it is a larger amount of radiation, you will again notice no symptoms from this.  The pill is gone from your body in a few days (during which you should stay away from other people), but takes several months to work.  Therefore, please return here approximately 6-8 weeks after the treatment.  This treatment has been available for many years, and the only known side-effect is an underactive thyroid.  It is possible that i would eventually prescribe for you a thyroid hormone pill, which is very inexpensive.  You don't have to worry about side-effects of this thyroid hormone pill, because it is the same molecule your thyroid makes.

## 2014-07-01 NOTE — Progress Notes (Signed)
Subjective:    Patient ID: Cassandra Levy, female    DOB: [birth year], 33 y.o.   MRN: [id number]  HPI Pt reports hypothyroidism was dx'ed in 2009, when she presented with severe weight gain.  she has been on prescribed thyroid hormone therapy from then. she has never had thyroid imaging.  She is considering a pregnancy.  she has never had thyroid surgery, or XRT to the neck.  she has never been on amiodarone or lithium.  In September of 2014, based on a TSH, synthroid was increased to 125 mcg/day.  In December of 12/14, it was reduced back to 75 mcg/day.  In early 2015, it was stopped altogether.  Since then, she feels better in general.   Past Medical History  Diagnosis Date  . Thyroid disease   . Allergy   . Anemia     Past Surgical History  Procedure Laterality Date  . Colposcopy      History   Social History  . Marital Status: Married    Spouse Name: N/A    Number of Children: N/A  . Years of Education: N/A   Occupational History  . Not on file.   Social History Main Topics  . Smoking status: Never Smoker   . Smokeless tobacco: Not on file  . Alcohol Use: No  . Drug Use: Not on file  . Sexual Activity: Not on file   Other Topics Concern  . Not on file   Social History Narrative  . No narrative on file    Current Outpatient Prescriptions on File Prior to Visit  Medication Sig Dispense Refill  . antipyrine-benzocaine (AURALGAN) otic solution Place 3-4 drops into the left ear every 2 (two) hours as needed for ear pain.  10 mL  0  . cyclobenzaprine (FLEXERIL) 10 MG tablet Take 1 tablet (10 mg total) by mouth 3 (three) times daily as needed for muscle spasms.  30 tablet  0  . EPINEPHrine (EPIPEN) 0.3 mg/0.3 mL SOAJ injection Inject 0.3 mLs (0.3 mg total) into the muscle once.  2 Device  2  . hydrochlorothiazide (HYDRODIURIL) 25 MG tablet Take 12.5 mg up to TID depending on your sympotms  60 tablet  3  . metFORMIN (GLUCOPHAGE) 500 MG tablet Take 500 mg by mouth 2  (two) times daily with a meal.       . naproxen sodium (ANAPROX DS) 550 MG tablet Take 1 tablet (550 mg total) by mouth 2 (two) times daily with a meal.  40 tablet  0  . norethindrone (AYGESTIN) 5 MG tablet Take 10 mg by mouth daily.        No current facility-administered medications on file prior to visit.    Allergies  Allergen Reactions  . Amoxicillin Swelling    Tightness in throat  . Cleocin [Clindamycin Hcl]     hives    Family History  Problem Relation Age of Onset  . Stroke Mother   . Leukemia Other     BP 132/84  Pulse 91  Temp(Src) 97 F (36.1 C) (Oral)  Ht 5\' 8"  (1.727 m)  Wt 332 lb (150.594 kg)  BMI 50.49 kg/m2  SpO2 98%  Review of Systems She denies tremor and palpitations.      Objective:   Physical Exam VITAL SIGNS:  See vs page GENERAL: no distress NECK: supple, thyroid is 2-3 times normal size, symmetrical. No palpable nodule.    Lab Results  Component Value Date   TSH 0.10* 07/01/2014  Assessment & Plan:  Hyperthyroidism: mild exacerbation, persistent after discontinuation.   Patient is advised the following: Patient Instructions  blood tests are being requested for you today.  We'll contact you with results. If it overactive, please consider the treatment options we discussed. The radioactive iodine treatment works like this: We would first check a thyroid "scan" (a special, but easy and painless type of thyroid x ray).  It works like this: you go to the x-ray department of the hospital to swallow a pill, which contains a miniscule amount of radiation.  You will not notice any symptoms from this.  You will go back to the x-ray department the next day, to lie down in front of a camera.  The results of this will be sent to me.   Based on the results, i hope to order for you a treatment pill of radioactive iodine.  Although it is a larger amount of radiation, you will again notice no symptoms from this.  The pill is gone from your body in a  few days (during which you should stay away from other people), but takes several months to work.  Therefore, please return here approximately 6-8 weeks after the treatment.  This treatment has been available for many years, and the only known side-effect is an underactive thyroid.  It is possible that i would eventually prescribe for you a thyroid hormone pill, which is very inexpensive.  You don't have to worry about side-effects of this thyroid hormone pill, because it is the same molecule your thyroid makes.

## 2014-08-09 ENCOUNTER — Telehealth: Payer: Self-pay

## 2014-08-09 DIAGNOSIS — E0590 Thyrotoxicosis, unspecified without thyrotoxic crisis or storm: Secondary | ICD-10-CM

## 2014-08-09 NOTE — Telephone Encounter (Signed)
[facility] called about Thyroid up-take scan. She state's that Pt's insurance is requesting a physician to physician consult. Case number is [id number] Contact for [employer] is [phone]. Please advise, Thanks!

## 2014-08-10 NOTE — Telephone Encounter (Signed)
Called pt and informed that she would need to schedule a lab appointment due to insurance not covering scan. Pt was currently at work and could not schedule appointment. Pt states that she would call back to schedule.

## 2014-08-10 NOTE — Telephone Encounter (Signed)
please call patient: Insurance has denied nuc med scan, because your thyroid is not high enough. Please come in for recheck.  You do not need to be fasting.

## 2014-08-26 ENCOUNTER — Ambulatory Visit (INDEPENDENT_AMBULATORY_CARE_PROVIDER_SITE_OTHER): Payer: 59 | Admitting: Family Medicine

## 2014-08-26 ENCOUNTER — Ambulatory Visit (INDEPENDENT_AMBULATORY_CARE_PROVIDER_SITE_OTHER): Payer: 59

## 2014-08-26 VITALS — BP 140/78 | HR 82 | Temp 97.5°F | Resp 18 | Ht 68.0 in | Wt 336.0 lb

## 2014-08-26 DIAGNOSIS — M25519 Pain in unspecified shoulder: Secondary | ICD-10-CM

## 2014-08-26 DIAGNOSIS — M25512 Pain in left shoulder: Secondary | ICD-10-CM

## 2014-08-26 MED ORDER — CYCLOBENZAPRINE HCL 10 MG PO TABS: 10.0000 mg | ORAL_TABLET | Freq: Every day | ORAL | Status: DC

## 2014-08-26 NOTE — Progress Notes (Signed)
[facility] [address] [place] [address] [phone]  Date:  08/26/2014   Name:  Cassandra Levy   DOB:  [birth year]   MRN:  [id number]  PCP:  Tally Due, MD    Chief Complaint: Shoulder Pain   History of Present Illness:  Cassandra Levy is a 33 y.o. very pleasant female patient who presents with the following:  She is here today to discuss a problem with her left shoulder.  Her chronic LE cellulitis has finally gotten better, and she is now able to work again.  She has kept her job with [employer] and is still able to wear her soft regular pants instead of uniform pants.   In early August she awoke one day and noted that her left shoulder was sore.  She used advil and thought that she was ok.  Over the rest of the month, her shoulder has hurt off an on, more so if she tries to use it.  She cannot get her left shoulder all the way flexed/ cannot lift over her head.  She had some flexeril at home and has tried it for this- she is taking it at bedtime and it does seem to be helping   There was no known acute injury.   Wt Readings from Last 3 Encounters:  08/26/14 336 lb (152.409 kg)  07/01/14 332 lb (150.594 kg)  06/22/14 332 lb (150.594 kg)    Patient Active Problem List   Diagnosis Date Noted  . Hyperthyroidism 01/20/2014  . PCOS (polycystic ovarian syndrome) 12/13/2013  . Cellulitis of left lower extremity 12/03/2013  . Cellulitis 12/03/2013  . Lower extremity edema 12/03/2013  . Obesity 10/08/2012  . Goiter 06/27/2012  . Constipation 06/27/2012    Past Medical History  Diagnosis Date  . Thyroid disease   . Allergy   . Anemia     Past Surgical History  Procedure Laterality Date  . Colposcopy      History  Substance Use Topics  . Smoking status: Never Smoker   . Smokeless tobacco: Not on file  . Alcohol Use: No    Family History  Problem Relation Age of Onset  . Stroke Mother   . Leukemia Other     Allergies   Allergen Reactions  . Amoxicillin Swelling    Tightness in throat  . Cleocin [Clindamycin Hcl]     hives    Medication list has been reviewed and updated.  Current Outpatient Prescriptions on File Prior to Visit  Medication Sig Dispense Refill  . cyclobenzaprine (FLEXERIL) 10 MG tablet Take 1 tablet (10 mg total) by mouth 3 (three) times daily as needed for muscle spasms.  30 tablet  0  . EPINEPHrine (EPIPEN) 0.3 mg/0.3 mL SOAJ injection Inject 0.3 mLs (0.3 mg total) into the muscle once.  2 Device  2  . hydrochlorothiazide (HYDRODIURIL) 25 MG tablet Take 12.5 mg up to TID depending on your sympotms  60 tablet  3  . metFORMIN (GLUCOPHAGE) 500 MG tablet Take 500 mg by mouth 2 (two) times daily with a meal.       . norethindrone (AYGESTIN) 5 MG tablet Take 10 mg by mouth daily.       Marland Kitchen antipyrine-benzocaine (AURALGAN) otic solution Place 3-4 drops into the left ear every 2 (two) hours as needed for ear pain.  10 mL  0  . naproxen sodium (ANAPROX DS) 550 MG tablet Take 1 tablet (550 mg total) by  mouth 2 (two) times daily with a meal.  40 tablet  0   No current facility-administered medications on file prior to visit.    Review of Systems:  As per HPI- otherwise negative.   Physical Examination: Filed Vitals:   08/26/14 1555  BP: 140/78  Pulse: 82  Temp: 97.5 F (36.4 C)  Resp: 18   Filed Vitals:   08/26/14 1555  Height:  (1.727 m)  Weight: 336 lb (152.409 kg)   Body mass index is 51.1 kg/(m^2). Ideal Body Weight: Weight in (lb) to have BMI = 25: 164.1  GEN: WDWN, NAD, Non-toxic, A & O x 3, obese, looks well HEENT: Atraumatic, Normocephalic. Neck supple. No masses, No LAD. Ears and Nose: No external deformity. CV: RRR, No M/G/R. No JVD. No thrill. No extra heart sounds. PULM: CTA B, no wheezes, crackles, rhonchi. No retractions. No resp. distress. No accessory muscle use. EXTR: No c/c/e.  No current cellulitis or redness NEURO Normal gait.  PSYCH: Normally  interactive. Conversant. Not depressed or anxious appearing.  Calm demeanor.  Left shoulder: she has mild tenderness over the anterior RCT insertion.  Flexion and abduction are limited to about 150 degrees.  otherwise she has full ROM, normal strength and sensation, negative empty can  [facility] reading (PRIMARY) by  Dr. Patsy Lager. Left shoulder: negative  LEFT SHOULDER - 2+ VIEW  COMPARISON: None.  FINDINGS: Frontal, Y scapular, and axillary images were obtained. There is no fracture or dislocation. Joint spaces appear intact. No erosive change.  IMPRESSION: No fracture or appreciable arthropathy.  Assessment and Plan: Left shoulder pain - Plan: cyclobenzaprine (FLEXERIL) 10 MG tablet, DG Shoulder Left, Ambulatory referral to Physical Therapy  Treat for left shoulder strain with associated decreased ROM in the shoulder with flexeril as needed and PT.  Admits to doing some overhead lifting at work- advised her to always use a step stool.  encouraged gentle stretching to work on her ROM  Signed Abbe Amsterdam, MD

## 2014-08-26 NOTE — Patient Instructions (Signed)
Use advil and flexeil as needed for you shoulder.  We will refer you to PT to work on your shoulder.  In the meantime try to maintain your range of motion as best as you can.

## 2014-09-14 ENCOUNTER — Other Ambulatory Visit: Payer: Self-pay | Admitting: Family Medicine

## 2014-09-15 NOTE — Telephone Encounter (Signed)
Dr Patsy Lageropland, do you want to give any RFs? It's a little early for the orig Rx to have run out.

## 2014-09-20 ENCOUNTER — Ambulatory Visit: Payer: 59 | Attending: Family Medicine | Admitting: Physical Therapy

## 2014-09-20 DIAGNOSIS — M25512 Pain in left shoulder: Secondary | ICD-10-CM | POA: Insufficient documentation

## 2014-09-20 DIAGNOSIS — Z5189 Encounter for other specified aftercare: Secondary | ICD-10-CM | POA: Diagnosis present

## 2014-10-11 ENCOUNTER — Ambulatory Visit: Payer: 59 | Admitting: Physical Therapy

## 2014-10-11 ENCOUNTER — Other Ambulatory Visit: Payer: Self-pay | Admitting: Family Medicine

## 2014-10-11 DIAGNOSIS — Z5189 Encounter for other specified aftercare: Secondary | ICD-10-CM | POA: Diagnosis not present

## 2014-10-24 ENCOUNTER — Ambulatory Visit: Payer: 59 | Attending: Family Medicine | Admitting: Physical Therapy

## 2014-10-24 DIAGNOSIS — Z5189 Encounter for other specified aftercare: Secondary | ICD-10-CM | POA: Diagnosis present

## 2014-10-24 DIAGNOSIS — M25512 Pain in left shoulder: Secondary | ICD-10-CM

## 2014-10-24 NOTE — Therapy (Signed)
Physical Therapy Treatment  Patient Details  Name: Cassandra Levy MRN: [id number] Date of Birth: 06/02/1981  Encounter Date: 10/24/2014      PT End of Session - 10/24/14 1731    Visit Number 3   Number of Visits 8   Date for PT Re-Evaluation 11/20/14   PT Start Time 1635   PT Stop Time 1740   PT Time Calculation (min) 65 min   Activity Tolerance Patient tolerated treatment well      Past Medical History  Diagnosis Date  . Thyroid disease   . Allergy   . Anemia     Past Surgical History  Procedure Laterality Date  . Colposcopy      There were no vitals taken for this visit.  Visit Diagnosis:  Pain in joint, shoulder region, left        [facility] PT Assessment - 10/24/14 1747    AROM   Left Shoulder Flexion 137 Degrees   Left Shoulder ABduction 160 Degrees   Left Shoulder Internal Rotation --  equal to right   Left Shoulder External Rotation --  reaches T3 =to Rt          [facility] Adult PT Treatment/Exercise - 10/24/14 1656    Shoulder Exercises: Supine   Shoulder ABduction Weight (lbs) 0 lbs. 'T" with head  press 10 reps   Other Supine Exercises Head press with 2 Lbs circles and flexion, extension1  0 lbs 15 reps   Shoulder Exercises: Prone   Flexion Weight (lbs) o lbs 10  with scapular guidance   Extension Weight (lbs) 10 reps, 0lbs   Horizontal ABduction 1 Weight (lbs) 0 lbs,10 lbs  With scapular guidance   Shoulder Exercises: Sidelying   External Rotation Weight (lbs) 0 LBS, 10 with shoulder position cues   ABduction Weight (lbs) 0 lbs  with shoulder position cues   Shoulder Exercises: Standing   Flexion Limitations Wall slides 5 reps, added to home exercise.   Shoulder Exercises: Pulleys   Flexion 3 minutes  total of 5 minutes   Cryotherapy   Number Minutes Cryotherapy 10 Minutes   Cryotherapy Location Shoulder  Left   Type of Cryotherapy Ice pack            PT Short Term Goals - 10/24/14 1752    PT SHORT TERM GOAL #1   Title be  independent with initial home ex program   Time 4   Period Weeks   Status Achieved   PT SHORT TERM GOAL #2   Title improved shoulder flexion to 153 needed for home and work tasks   Time 4   Period Weeks   Status On-going   PT SHORT TERM GOAL #3   Title Patient will report her pain level improved to 3-4/10 at worst   Time 4   Period Weeks   Status On-going          PT Long Term Goals - 10/24/14 1754    PT LONG TERM GOAL #1   Title be independent with advanced home exercise program   Time 8   Period Weeks   Status On-going   PT LONG TERM GOAL #2   Title Improve Lt shoulder flexion and abduction to 160 degrees needed for home and work tasks   Time 8   Period Weeks   Status On-going   PT LONG TERM GOAL #3   Title Left scapular strength improved to 4 plus/5 stabilization of the scapula while lifting heavier items for work  Time 8   Period Weeks   Status On-going   PT LONG TERM GOAL #4   Title FOTO functional outcome score improved from 37% to 25% limit indicating improved function with less pain   Time 8   Period Weeks   Status On-going          Plan - 10/24/14 1751    Clinical Impression Statement adherent with home exercise, takes less pain meds, avoids heavy lifting STG #1 met, Progress toward STG#@ and 3.        Problem List Patient Active Problem List   Diagnosis Date Noted  . Hyperthyroidism 01/20/2014  . PCOS (polycystic ovarian syndrome) 12/13/2013  . Cellulitis of left lower extremity 12/03/2013  . Cellulitis 12/03/2013  . Lower extremity edema 12/03/2013  . Obesity 10/08/2012  . Goiter 06/27/2012  . Constipation 06/27/2012                                            HARRIS,KAREN 10/24/2014, 6:00 PM

## 2014-10-31 ENCOUNTER — Ambulatory Visit: Payer: 59 | Admitting: Physical Therapy

## 2014-10-31 DIAGNOSIS — M25512 Pain in left shoulder: Secondary | ICD-10-CM

## 2014-10-31 DIAGNOSIS — Z5189 Encounter for other specified aftercare: Secondary | ICD-10-CM | POA: Diagnosis not present

## 2014-10-31 NOTE — Patient Instructions (Addendum)
Avoid painful activities.   Use tennis balls to press areas hard to reach.  Gentle presses. 5 to 10 reps.   Continue to use ice, heat may also help.Home Modalities: Heat Methods Hot Water Bottle: Apply to __area____ for __20_ minutes. Microwave Pack: Apply to ___area___ for _20__ minutes. Electric Heating Pad: Apply to __area____ for _20__ minutes. CAUTION: Check skin every ___ minutes during heat application. Skin should turn no darker than a bright pink color. Do 1 to4___ times per day: before exercises after exercises _as needed_____.  Copyright  VHI. All rights reserved.  Cryotherapy Cryotherapy is when you put ice on your injury. Ice helps lessen pain and puffiness (swelling) after an injury. Ice works the best when you start using it in the first 24 to 48 hours after an injury. HOME CARE  Put a dry or damp towel between the ice pack and your skin.  You may press gently on the ice pack.  Leave the ice on for no more than 10 to 20 minutes at a time.  Check your skin after 5 minutes to make sure your skin is okay.  Rest at least 20 minutes between ice pack uses.  Stop using ice when your skin loses feeling (numbness).  Do not use ice on someone who cannot tell you when it hurts. This includes small children and people with memory problems (dementia). GET HELP RIGHT AWAY IF:  You have white spots on your skin.  Your skin turns blue or pale.  Your skin feels waxy or hard.  Your puffiness gets worse. MAKE SURE YOU:   Understand these instructions.  Will watch your condition.  Will get help right away if you are not doing well or get worse. Document Released: 05/20/2008 Document Revised: 02/24/2012 Document Reviewed: 07/25/2011 [facility] Patient Information 2015 [employer], [place]. This information is not intended to replace advice given to you by your health care provider. Make sure you discuss any questions you have with your health care provider. Heat Therapy Heat  therapy can help make painful, stiff muscles and joints feel better. Do not use heat on new injuries. Wait at least 48 hours after an injury to use heat. Do not use heat when you have aches or pains right after an activity. If you still have pain 3 hours after stopping the activity, then you may use heat. HOME CARE Wet heat pack  Soak a clean towel in warm water. Squeeze out the extra water.  Put the warm, wet towel in a plastic bag.  Place a thin, dry towel between your skin and the bag.  Put the heat pack on the area for 5 minutes, and check your skin. Your skin may be pink, but it should not be red.  Leave the heat pack on the area for 15 to 30 minutes.  Repeat this every 2 to 4 hours while awake. Do not use heat while you are sleeping. Warm water bath  Fill a tub with warm water.  Place the affected body part in the tub.  Soak the area for 20 to 40 minutes.  Repeat as needed. Hot water bottle  Fill the water bottle half full with hot water.  Press out the extra air. Close the cap tightly.  Place a dry towel between your skin and the bottle.  Put the bottle on the area for 5 minutes, and check your skin. Your skin may be pink, but it should not be red.  Leave the bottle on the area for  15 to 30 minutes.  Repeat this every 2 to 4 hours while awake. Electric heating pad  Place a dry towel between your skin and the heating pad.  Set the heating pad on low heat.  Put the heating pad on the area for 10 minutes, and check your skin. Your skin may be pink, but it should not be red.  Leave the heating pad on the area for 20 to 40 minutes.  Repeat this every 2 to 4 hours while awake.  Do not lie on the heating pad.  Do not fall asleep while using the heating pad.  Do not use the heating pad near water. GET HELP RIGHT AWAY IF:  You get blisters or red skin.  Your skin is puffy (swollen), or you lose feeling (numbness) in the affected area.  You have any new  problems.  Your problems are getting worse.  You have any questions or concerns. If you have any problems, stop using heat therapy until you see your doctor. MAKE SURE YOU:  Understand these instructions.  Will watch your condition.  Will get help right away if you are not doing well or get worse. Document Released: 02/24/2012 Document Reviewed: 01/25/2014 [facility] Patient Information 2015 [place], [place]. This information is not intended to replace advice given to you by your health care provider. Make sure you discuss any questions you have with your health care provider.

## 2014-10-31 NOTE — Therapy (Signed)
Physical Therapy Treatment  Patient Details  Name: Cassandra CopaJessica L Lyles MRN: [id number] Date of Birth: 1981/08/22  Encounter Date: 10/31/2014      PT End of Session - 10/31/14 1744    Visit Number 4   Number of Visits 8   Date for PT Re-Evaluation 11/20/14   PT Start Time 1635   PT Stop Time 1750   PT Time Calculation (min) 75 min   Activity Tolerance Patient tolerated treatment well      Past Medical History  Diagnosis Date  . Thyroid disease   . Allergy   . Anemia     Past Surgical History  Procedure Laterality Date  . Colposcopy      There were no vitals taken for this visit.  Visit Diagnosis:  Pain in joint, shoulder region, left      Subjective Assessment - 10/31/14 1639    Symptoms uncomfortable.  Burning.  Had a bad week.  Pain went to 5 or six.  Woke upTuesday am  amd could hardley move it.  Could not put bra on.  Ice helpful 4 times.  Able to sleep on left side a little   Currently in Pain? Yes   Pain Score 1    Pain Location Shoulder   Pain Orientation Left   Pain Descriptors / Indicators Burning   Pain Radiating Towards shoulder blade   Pain Onset More than a month ago   Aggravating Factors  (p) reaching, sleeping on, rolling shoulders forward   Pain Relieving Factors rest, ice.   Multiple Pain Sites No            [facility] Adult PT Treatment/Exercise - 10/31/14 1640    Moist Heat Therapy   Number Minutes Moist Heat 15 Minutes   Moist Heat Location Shoulder   Electrical Stimulation   Electrical Stimulation Location shoulder   Electrical Stimulation Action relaxing   Electrical Stimulation Parameters IFC to tolerance (4)   Electrical Stimulation Goals Pain   Manual Therapy   Manual Therapy Other (comment)  neuromuscular trigger point release followed - stretch     Responded well to treatment.  "I feel like I could go home and sleep"     PT Education - 10/31/14 1742    Education provided Yes   Person(s) Educated Patient   Methods  Explanation;Handout   Comprehension Verbalized understanding              Plan - 10/31/14 1745    Clinical Impression Statement flare up last week.  Focus today on decreasing pain, guarding.   PT Next Visit Plan Progress to exercise if able.        Problem List Patient Active Problem List   Diagnosis Date Noted  . Hyperthyroidism 01/20/2014  . PCOS (polycystic ovarian syndrome) 12/13/2013  . Cellulitis of left lower extremity 12/03/2013  . Cellulitis 12/03/2013  . Lower extremity edema 12/03/2013  . Obesity 10/08/2012  . Goiter 06/27/2012  . Constipation 06/27/2012                                              Laporchia Nakajima PTA 10/31/2014, 5:58 PM

## 2014-11-07 ENCOUNTER — Ambulatory Visit: Payer: 59

## 2014-11-07 ENCOUNTER — Encounter: Payer: 59 | Admitting: Physical Therapy

## 2014-11-07 DIAGNOSIS — M25512 Pain in left shoulder: Secondary | ICD-10-CM

## 2014-11-07 DIAGNOSIS — Z5189 Encounter for other specified aftercare: Secondary | ICD-10-CM | POA: Diagnosis not present

## 2014-11-07 NOTE — Therapy (Signed)
Physical Therapy Treatment  Patient Details  Name: Cassandra Levy MRN: [id number] Date of Birth: 1981-01-10  Encounter Date: 11/07/2014      PT End of Session - 11/07/14 1601    Visit Number 5   Number of Visits 8   Date for PT Re-Evaluation 11/20/14   PT Start Time 1550   PT Stop Time 1630   PT Time Calculation (min) 40 min   Activity Tolerance Patient tolerated treatment well      Past Medical History  Diagnosis Date  . Thyroid disease   . Allergy   . Anemia     Past Surgical History  Procedure Laterality Date  . Colposcopy      There were no vitals taken for this visit.  Visit Diagnosis:  Left shoulder pain      Subjective Assessment - 11/07/14 1557    Symptoms Pt reports good relief since last visit with 1-2/10 pain. Pt reports PT has been helping since beginning PT. Pt reports improvements noted in overhead reaching.    Pertinent History L shoulder pain   Limitations Lifting;House hold activities   Currently in Pain? Yes   Pain Score 2    Pain Location Shoulder   Pain Orientation Left   Pain Descriptors / Indicators Aching   Pain Type Chronic pain   Pain Onset More than a month ago   Pain Relieving Factors rest, ice             [facility] Adult PT Treatment/Exercise - 11/07/14 0001    Exercises   Exercises Neck   Neck Exercises: Stretches   Upper Trapezius Stretch 3 reps;30 seconds   Corner Stretch 3 reps;30 seconds   Chest Stretch 3 reps;30 seconds   Moist Heat Therapy   Number Minutes Moist Heat 10 Minutes   Moist Heat Location Shoulder  L   Manual Therapy   Manual Therapy Other (comment)   Other Manual Therapy SCS to L UT , pect with good relief. STM to L UT , rhomboid, and pect region. Inf post glide to L GH joint to promote shoulder flexion. GH distraction.                Plan - 11/07/14 1602    Clinical Impression Statement Focus today on manual therapy, decreased pain since good response to last session.    Pt will benefit  from skilled therapeutic intervention in order to improve on the following deficits Pain;Decreased mobility;Decreased strength;Impaired UE functional use   Rehab Potential Excellent   PT Frequency 2x / week   PT Next Visit Plan Progress to exercise if able.   Consulted and Agree with Plan of Care Patient        Problem List Patient Active Problem List   Diagnosis Date Noted  . Hyperthyroidism 01/20/2014  . PCOS (polycystic ovarian syndrome) 12/13/2013  . Cellulitis of left lower extremity 12/03/2013  . Cellulitis 12/03/2013  . Lower extremity edema 12/03/2013  . Obesity 10/08/2012  . Goiter 06/27/2012  . Constipation 06/27/2012                                              Haze Rushingenzi, Berdell Nevitt, PT  11/07/2014, 4:33 PM

## 2014-11-21 ENCOUNTER — Ambulatory Visit: Payer: 59 | Attending: Family Medicine | Admitting: Physical Therapy

## 2014-11-21 DIAGNOSIS — Z5189 Encounter for other specified aftercare: Secondary | ICD-10-CM | POA: Insufficient documentation

## 2014-11-21 DIAGNOSIS — M25512 Pain in left shoulder: Secondary | ICD-10-CM | POA: Diagnosis not present

## 2014-11-21 NOTE — Patient Instructions (Signed)
For shoulder depression with green band. 10 reps able to do painfree

## 2014-11-21 NOTE — Therapy (Signed)
[facility] [address] [place], [place], [address] Phone: [phone]   Fax:  [phone]  Physical Therapy Treatment  Patient Details  Name: Cassandra Levy MRN: [id number] Date of Birth: [birth year]  Encounter Date: 11/21/2014      PT End of Session - 11/21/14 1749    Visit Number 6   Number of Visits 8   Date for PT Re-Evaluation 11/20/14   PT Start Time 1640   PT Stop Time 1745   PT Time Calculation (min) 65 min   Activity Tolerance Patient tolerated treatment well      Past Medical History  Diagnosis Date  . Thyroid disease   . Allergy   . Anemia     Past Surgical History  Procedure Laterality Date  . Colposcopy      There were no vitals taken for this visit.  Visit Diagnosis:  Left shoulder pain  Pain in joint, shoulder region, left          [facility] Adult PT Treatment/Exercise - 11/21/14 1640    Shoulder Exercises: Sidelying   External Rotation AROM   Theraband Level (Shoulder External Rotation) --  0 lbs, 10 reps, cues to keep shoulder away from ear.   External Rotation Weight (lbs) --  0 Lbs. 10 reps, cues to keep shoulder away from ear   Shoulder Exercises: Standing   Horizontal ABduction --  1 rep to 90 degrees, can touch opposite shoulder pain free.   Horizontal ABduction Limitations 90 degrees, also horizontal adduction to Rt shoulder painfree.   External Rotation Limitations Reaches T4, equal to Rt   Internal Rotation --  able to reach bra line   Flexion Limitations --  150 degrees active    ABduction Limitations --  145 drgrees active abduction   Other Standing Exercises --  green band issued for shoulder depressipon, 10 reps, painfre   Moist Heat Therapy   Number Minutes Moist Heat 15 Minutes   Moist Heat Location Shoulder  Lt, sidelying   Electrical Stimulation   Electrical Stimulation Location shoulder   Electrical Stimulation Action IFC   Electrical Stimulation Parameters 4.8   Electrical Stimulation Goals Pain   Manual Therapy   Manual Therapy --  soft tissue work and neuromuscular trigger point release to           PT Education - 11/21/14 1749    Education provided Yes   Person(s) Educated Patient   Methods Explanation;Demonstration;Handout   Comprehension Verbalized understanding;Returned demonstration          PT Short Term Goals - 11/21/14 1752    PT SHORT TERM GOAL #2   Title improved shoulder flexion to 153 needed for home and work tasks   Time 4   Period Weeks   Status On-going   PT SHORT TERM GOAL #3   Title Patient will report her pain level improved to 3-4/10 at worst   Time 4   Period Weeks   Status Achieved          PT Long Term Goals - 11/21/14 1752    PT LONG TERM GOAL #1   Title be independent with advanced home exercise program   Time 8   Period Weeks   Status On-going   PT LONG TERM GOAL #2   Title Improve Lt shoulder flexion and abduction to 160 degrees needed for home and work tasks   Time 8   Period Weeks   Status On-going   PT LONG TERM GOAL #3  Title Left scapular strength improved to 4 plus/5 stabilization of the scapula while lifting heavier items for work   Time 8   Period Weeks   Status Unable to assess   PT LONG TERM GOAL #4   Title FOTO functional outcome score improved from 37% to 25% limit indicating improved function with less pain   Time 8   Period Weeks   Status Unable to assess          Plan - 11/21/14 1750    Clinical Impression Statement Needs ERO for POC.  review shoulder depression exercises.  progress strengthening as tolerated.  Stretches very helpful.  Strengthening harder.  Progress made toward home exercise goal.   PT Next Visit Plan review exercise, shoulder depression with band, green, Needs an ERO and new POC.   Consulted and Agree with Plan of Care Patient                               Problem List Patient Active Problem List   Diagnosis Date Noted   . Hyperthyroidism 01/20/2014  . PCOS (polycystic ovarian syndrome) 12/13/2013  . Cellulitis of left lower extremity 12/03/2013  . Cellulitis 12/03/2013  . Lower extremity edema 12/03/2013  . Obesity 10/08/2012  . Goiter 06/27/2012  . Constipation 06/27/2012  Liz BeachKaren Sherrise Liberto, PTA 11/21/2014 5:55 PM Phone: [phone] Fax: [phone]   Cassandra Levy 11/21/2014, 5:55 PM

## 2014-11-23 ENCOUNTER — Telehealth: Payer: Self-pay

## 2014-11-23 ENCOUNTER — Ambulatory Visit (INDEPENDENT_AMBULATORY_CARE_PROVIDER_SITE_OTHER): Payer: 59 | Admitting: Emergency Medicine

## 2014-11-23 VITALS — BP 125/83 | HR 84 | Temp 99.2°F | Resp 18 | Ht 67.5 in | Wt 340.0 lb

## 2014-11-23 DIAGNOSIS — R1031 Right lower quadrant pain: Secondary | ICD-10-CM

## 2014-11-23 DIAGNOSIS — R1011 Right upper quadrant pain: Secondary | ICD-10-CM

## 2014-11-23 DIAGNOSIS — N3090 Cystitis, unspecified without hematuria: Secondary | ICD-10-CM

## 2014-11-23 DIAGNOSIS — E282 Polycystic ovarian syndrome: Secondary | ICD-10-CM

## 2014-11-23 LAB — POCT CBC
GRANULOCYTE PERCENT: 75.9 % (ref 37–80)
HCT, POC: 45.6 % (ref 37.7–47.9)
HEMOGLOBIN: 15.1 g/dL (ref 12.2–16.2)
Lymph, poc: 2.8 (ref 0.6–3.4)
MCH, POC: 29.7 pg (ref 27–31.2)
MCHC: 33.1 g/dL (ref 31.8–35.4)
MCV: 89.9 fL (ref 80–97)
MID (cbc): 0.5 (ref 0–0.9)
MPV: 8.1 fL (ref 0–99.8)
PLATELET COUNT, POC: 327 10*3/uL (ref 142–424)
POC GRANULOCYTE: 10.2 — AB (ref 2–6.9)
POC LYMPH PERCENT: 20.7 %L (ref 10–50)
POC MID %: 3.4 % (ref 0–12)
RBC: 5.07 M/uL (ref 4.04–5.48)
RDW, POC: 14 %
WBC: 13.5 10*3/uL — AB (ref 4.6–10.2)

## 2014-11-23 LAB — POCT URINALYSIS DIPSTICK
Bilirubin, UA: NEGATIVE
Glucose, UA: NEGATIVE
Ketones, UA: 15
NITRITE UA: NEGATIVE
PROTEIN UA: NEGATIVE
Spec Grav, UA: 1.025
UROBILINOGEN UA: 0.2
pH, UA: 5.5

## 2014-11-23 LAB — POCT UA - MICROSCOPIC ONLY
Casts, Ur, LPF, POC: NEGATIVE
Crystals, Ur, HPF, POC: NEGATIVE
Mucus, UA: NEGATIVE
Yeast, UA: NEGATIVE

## 2014-11-23 LAB — POCT URINE PREGNANCY: PREG TEST UR: NEGATIVE

## 2014-11-23 MED ORDER — CIPROFLOXACIN HCL 500 MG PO TABS: 500.0000 mg | ORAL_TABLET | Freq: Two times a day (BID) | ORAL | Status: DC

## 2014-11-23 NOTE — Telephone Encounter (Signed)
Advised pt to come into the office. Pt is going to come in tonight.

## 2014-11-23 NOTE — Telephone Encounter (Signed)
Patient started having abdominal pain this morning and she has a loss of appetite. Patient is concerned and is requesting to speak with Dr Patsy Lageropland if possible. I informed her the message would be routed to the clinical staff and could not guaranteed her she would get called back by Dr Patsy Lageropland. Patient wants to know if she should be concerned or not if this is serious. I informed patient there is no way to determine this by phone and she really should try to come in to the walk in to be evaluated. She stated with her job she is on call and it is really difficult for her to come in. I did let her know we are open till 8:30 pm and she could see any of our providers. Patient requested I please just send the message to Dr Patsy Lageropland and she would see what she could do to come in. Patients call back number is [phone]

## 2014-11-23 NOTE — Patient Instructions (Signed)

## 2014-11-23 NOTE — Progress Notes (Signed)
[facility] [address], [place] [address] [phone]  Date:  11/23/2014   Name:  Cassandra Levy   DOB:  12/17/1980   MRN:  [id number]  PCP:  Tally DueGUEST, CHRIS WARREN, MD    Chief Complaint: Abdominal Pain   History of Present Illness:  Cassandra Levy is a 33 y.o. very pleasant female patient who presents with the following:  Developed RLQ pain this morning.  Dull, achy pain.  Worse with movement and less when sits. Has not moved. Not anorectic.  No nausea or vomiting.  No stool change No fever or chills  No GU or GYN symptoms.  Not sexually active due "gyn stuff". No history of constipation.  Has PCOS. No improvement with over the counter medications or other home remedies.  Denies other complaint or health concern today.   Patient Active Problem List   Diagnosis Date Noted  . Hyperthyroidism 01/20/2014  . PCOS (polycystic ovarian syndrome) 12/13/2013  . Cellulitis of left lower extremity 12/03/2013  . Cellulitis 12/03/2013  . Lower extremity edema 12/03/2013  . Obesity 10/08/2012  . Goiter 06/27/2012  . Constipation 06/27/2012    Past Medical History  Diagnosis Date  . Thyroid disease   . Allergy   . Anemia     Past Surgical History  Procedure Laterality Date  . Colposcopy      History  Substance Use Topics  . Smoking status: Never Smoker   . Smokeless tobacco: Not on file  . Alcohol Use: No    Family History  Problem Relation Age of Onset  . Stroke Mother   . Leukemia Other     Allergies  Allergen Reactions  . Amoxicillin Swelling    Tightness in throat  . Cleocin [Clindamycin Hcl]     hives    Medication list has been reviewed and updated.  Current Outpatient Prescriptions on File Prior to Visit  Medication Sig Dispense Refill  . EPINEPHrine (EPIPEN) 0.3 mg/0.3 mL SOAJ injection Inject 0.3 mLs (0.3 mg total) into the muscle once. 2 Device 2  . hydrochlorothiazide (HYDRODIURIL) 25 MG tablet TAKE 1/2 MG UP TO  3 TIMES A DAY DEPENDING ON YOUR SYMPOTMS 60 tablet 0  . metFORMIN (GLUCOPHAGE) 500 MG tablet Take 500 mg by mouth 2 (two) times daily with a meal.     . norethindrone (AYGESTIN) 5 MG tablet Take 10 mg by mouth daily.      No current facility-administered medications on file prior to visit.    Review of Systems:  As per HPI, otherwise negative.    Physical Examination: Filed Vitals:   11/23/14 1746  BP: 125/83  Pulse: 84  Temp: 99.2 F (37.3 C)  Resp: 18   Filed Vitals:   11/23/14 1746  Height: 5' 7.5" (1.715 m)  Weight: 340 lb (154.223 kg)   Body mass index is 52.43 kg/(m^2). Ideal Body Weight: Weight in (lb) to have BMI = 25: 161.7  GEN: morbid obesity, NAD, Non-toxic, A & O x 3 HEENT: Atraumatic, Normocephalic. Neck supple. No masses, No LAD. Ears and Nose: No external deformity. CV: RRR, No M/G/R. No JVD. No thrill. No extra heart sounds. PULM: CTA B, no wheezes, crackles, rhonchi. No retractions. No resp. distress. No accessory muscle use. ABD: S, right upper quadrant tenderness, ND, +BS. No rebound. No HSM. EXTR: No c/c/e NEURO Normal gait.  PSYCH: Normally interactive. Conversant. Not depressed or anxious appearing.  Calm demeanor.    Assessment and Plan: Abdominal  pain UTI Septra Sono  Signed,  Phillips OdorJeffery Tatelyn Vanhecke, MD   Results for orders placed or performed in visit on 11/23/14  POCT CBC  Result Value Ref Range   WBC 13.5 (A) 4.6 - 10.2 K/uL   Lymph, poc 2.8 0.6 - 3.4   POC LYMPH PERCENT 20.7 10 - 50 %L   MID (cbc) 0.5 0 - 0.9   POC MID % 3.4 0 - 12 %M   POC Granulocyte 10.2 (A) 2 - 6.9   Granulocyte percent 75.9 37 - 80 %G   RBC 5.07 4.04 - 5.48 M/uL   Hemoglobin 15.1 12.2 - 16.2 g/dL   HCT, POC 16.145.6 09.637.7 - 47.9 %   MCV 89.9 80 - 97 fL   MCH, POC 29.7 27 - 31.2 pg   MCHC 33.1 31.8 - 35.4 g/dL   RDW, POC 04.514.0 %   Platelet Count, POC 327 142 - 424 K/uL   MPV 8.1 0 - 99.8 fL  POCT urine pregnancy  Result Value Ref Range   Preg Test, Ur  Negative   POCT urinalysis dipstick  Result Value Ref Range   Color, UA yellow    Clarity, UA hazy    Glucose, UA neg    Bilirubin, UA neg    Ketones, UA 15    Spec Grav, UA 1.025    Blood, UA moderate    pH, UA 5.5    Protein, UA neg    Urobilinogen, UA 0.2    Nitrite, UA neg    Leukocytes, UA moderate (2+)   POCT UA - Microscopic Only  Result Value Ref Range   WBC, Ur, HPF, POC 20-30    RBC, urine, microscopic 5-10    Bacteria, U Microscopic 1+    Mucus, UA neg    Epithelial cells, urine per micros 15-20    Crystals, Ur, HPF, POC neg    Casts, Ur, LPF, POC neg    Yeast, UA neg

## 2014-11-24 LAB — COMPREHENSIVE METABOLIC PANEL
ALT: 18 U/L (ref 0–35)
AST: 15 U/L (ref 0–37)
Albumin: 4.4 g/dL (ref 3.5–5.2)
Alkaline Phosphatase: 89 U/L (ref 39–117)
BUN: 12 mg/dL (ref 6–23)
CO2: 25 meq/L (ref 19–32)
Calcium: 9.7 mg/dL (ref 8.4–10.5)
Chloride: 102 mEq/L (ref 96–112)
Creat: 0.8 mg/dL (ref 0.50–1.10)
Glucose, Bld: 78 mg/dL (ref 70–99)
POTASSIUM: 3.9 meq/L (ref 3.5–5.3)
SODIUM: 138 meq/L (ref 135–145)
TOTAL PROTEIN: 7.7 g/dL (ref 6.0–8.3)
Total Bilirubin: 0.6 mg/dL (ref 0.2–1.2)

## 2014-11-28 ENCOUNTER — Ambulatory Visit
Admission: RE | Admit: 2014-11-28 | Discharge: 2014-11-28 | Disposition: A | Discharge status: Home / Self Care | Payer: 59 | Source: Ambulatory Visit | Attending: Emergency Medicine | Admitting: Emergency Medicine

## 2014-11-28 ENCOUNTER — Ambulatory Visit: Payer: 59 | Admitting: Physical Therapy

## 2014-11-28 DIAGNOSIS — R1011 Right upper quadrant pain: Secondary | ICD-10-CM

## 2014-11-29 ENCOUNTER — Telehealth: Payer: Self-pay

## 2014-11-29 NOTE — Telephone Encounter (Signed)
Patient wants to know results of her ultra sound.  [phone]

## 2014-11-29 NOTE — Telephone Encounter (Signed)
Please advise results to report to pt 

## 2014-12-01 NOTE — Telephone Encounter (Signed)
Pt called to say she had a missed call. States no one left a message. Please advise. CB # [phone]

## 2014-12-01 NOTE — Telephone Encounter (Signed)
No gall stones seen on US, called her is she still painful? Advised her of results. She is still complaining of RUQ discomfort/ states not severe pain but is uncomfortable. Please advise.

## 2014-12-01 NOTE — Telephone Encounter (Signed)
Pt is calling about ultrasound results. Dr Dareen Pianoanderson is out and pt is really wanting her results. Can we review them so she can have them?

## 2014-12-02 NOTE — Telephone Encounter (Signed)
It has been a week.  She had a UTI.  Because she is better and her US was normal I would give it a few more days and then if she is not considerable better I would RTC.

## 2014-12-02 NOTE — Telephone Encounter (Signed)
Pt.notified

## 2014-12-03 ENCOUNTER — Other Ambulatory Visit: Payer: Self-pay | Admitting: Emergency Medicine

## 2014-12-03 DIAGNOSIS — R1011 Right upper quadrant pain: Secondary | ICD-10-CM

## 2014-12-05 ENCOUNTER — Ambulatory Visit: Payer: 59 | Admitting: Physical Therapy

## 2014-12-05 DIAGNOSIS — M25512 Pain in left shoulder: Secondary | ICD-10-CM

## 2014-12-05 DIAGNOSIS — Z5189 Encounter for other specified aftercare: Secondary | ICD-10-CM | POA: Diagnosis not present

## 2014-12-05 NOTE — Therapy (Signed)
[facility], [place], [address] Phone: [phone]   Fax:  [phone]  Physical Therapy Treatment  Patient Details  Name: Cassandra Levy MRN: [id number] Date of Birth: 07-Jan-1981  Encounter Date: 12/05/2014      PT End of Session - 12/05/14 1718    Visit Number 7   Number of Visits 8   PT Start Time 5176   PT Stop Time 1714   PT Time Calculation (min) 39 min   Activity Tolerance Patient tolerated treatment well      Past Medical History  Diagnosis Date  . Thyroid disease   . Allergy   . Anemia     Past Surgical History  Procedure Laterality Date  . Colposcopy      LMP 10/28/2014 (Approximate)  Visit Diagnosis:  Left shoulder pain  Pain in joint, shoulder region, left      Subjective Assessment - 12/05/14 1638    Symptoms Has gall bladder issues.  Shoulder stiff.                     [facility] Adult PT Treatment/Exercise - 12/05/14 0001    Shoulder Exercises: Pulleys   Flexion 2 minutes   Other Pulley Exercises --  scaption, total 5 minutes   Shoulder Exercises: Isometric Strengthening   Flexion Other (comment)  10" X10   Extension --  10" X 10 seconds   External Rotation Other (comment)  10" X10   ABduction Other (comment)  10" X10                PT Education - 12/05/14 1715    Education provided Yes   Person(s) Educated Patient   Methods Explanation;Demonstration;Handout   Comprehension Verbalized understanding;Returned demonstration          PT Short Term Goals - 12/05/14 1723    PT SHORT TERM GOAL #2   Title improved shoulder flexion to 153 needed for home and work tasks   Time 4   Period Weeks   Status Achieved   PT SHORT TERM GOAL #3   Title Patient will report her pain level improved to 3-4/10 at worst   Time 4   Period Weeks   Status Achieved           PT Long Term Goals - 12/05/14 1725    PT LONG TERM GOAL #1   Title be  independent with advanced home exercise program   Time 8   Period Weeks   Status On-going   PT LONG TERM GOAL #2   Title Improve Lt shoulder flexion and abduction to 160 degrees needed for home and work tasks   Time 8   Period Weeks   Status Achieved   PT LONG TERM GOAL #3   Title Left scapular strength improved to 4 plus/5 stabilization of the scapula while lifting heavier items for work   Time 8   Period Weeks   Status Unable to assess   PT LONG TERM GOAL #4   Title FOTO functional outcome score improved from 37% to 25% limit indicating improved function with less pain   Time 8   Period Weeks   Status Achieved               Plan - 12/05/14 1720    Clinical Impression Statement Most goals met, patient resistant to Discharge today having made another appointment or two, she wants to be checked out by a PT since she  will need new order if she needs to return.   PT Next Visit Plan ERO. Review isometrics , Discharge if PT thinks she is ready.        Problem List Patient Active Problem List   Diagnosis Date Noted  . Hyperthyroidism 01/20/2014  . PCOS (polycystic ovarian syndrome) 12/13/2013  . Cellulitis of left lower extremity 12/03/2013  . Cellulitis 12/03/2013  . Lower extremity edema 12/03/2013  . Obesity 10/08/2012  . Goiter 06/27/2012  . Constipation 06/27/2012   Melvenia Needles, PTA 12/05/2014 5:29 PM Phone: [phone] Fax: [phone]   Melvenia Needles 12/05/2014, 5:29 PM  [facility] [place], [place], [address] Phone: [phone]   Fax:  [phone]

## 2014-12-05 NOTE — Patient Instructions (Addendum)
Strengthening: Isometric Flexion  Using wall for resistance, press right fist into ball using light pressure. Hold __10_ seconds. Repeat _10___ times per set. Do __1_ sets per session. Do _1___ sessions per day.  SHOULDER: Abduction (Isometric)  Use wall as resistance. Press arm against pillow. Keep elbow straight. Hold 10__ seconds. _10 reps per set, _1__ sets per day, 3 to 4__ days per week  Extension (Isometric)  Place left bent elbow and back of arm against wall. Press elbow against wall. Hold 10____ seconds. Repeat __10_ times. Do __1__ sessions per day.  Internal Rotation (Isometric)  Place palm of right fist against door frame, with elbow bent. Press fist against door frame. Hold ____ seconds. Repeat ____ times. Do _1_ sessions per day.  External Rotation (Isometric)  Place back of left fist against door frame, with elbow bent. Press fist against door frame. Hold __10__ seconds. Repeat _10__ times. Do __1__ sessions per day.  Copyright  VHI. All rights reserved.

## 2014-12-15 ENCOUNTER — Encounter (HOSPITAL_COMMUNITY)
Admission: RE | Admit: 2014-12-15 | Discharge: 2014-12-15 | Disposition: A | Discharge status: Home / Self Care | Payer: 59 | Source: Ambulatory Visit | Attending: Emergency Medicine | Admitting: Emergency Medicine

## 2014-12-15 DIAGNOSIS — R1011 Right upper quadrant pain: Secondary | ICD-10-CM | POA: Insufficient documentation

## 2014-12-15 MED ORDER — SINCALIDE 5 MCG IJ SOLR
0.0200 ug/kg | Freq: Once | INTRAMUSCULAR | Status: AC
  Administered 2014-12-15: 3.08 ug via INTRAVENOUS

## 2014-12-15 MED ORDER — TECHNETIUM TC 99M MEBROFENIN IV KIT
5.0000 | PACK | Freq: Once | INTRAVENOUS | Status: AC | PRN
  Administered 2014-12-15: 5 via INTRAVENOUS

## 2014-12-15 MED ORDER — SINCALIDE 5 MCG IJ SOLR
INTRAMUSCULAR | Status: AC
  Administered 2014-12-15: 3.08 ug via INTRAVENOUS
  Filled 2014-12-15: qty 10

## 2014-12-16 ENCOUNTER — Other Ambulatory Visit: Payer: Self-pay | Admitting: Emergency Medicine

## 2014-12-16 DIAGNOSIS — K819 Cholecystitis, unspecified: Secondary | ICD-10-CM

## 2014-12-19 ENCOUNTER — Ambulatory Visit: Payer: 59 | Attending: Family Medicine | Admitting: Physical Therapy

## 2014-12-19 ENCOUNTER — Encounter: Payer: 59 | Admitting: Physical Therapy

## 2014-12-19 DIAGNOSIS — M25512 Pain in left shoulder: Secondary | ICD-10-CM | POA: Insufficient documentation

## 2014-12-19 DIAGNOSIS — Z5189 Encounter for other specified aftercare: Secondary | ICD-10-CM | POA: Insufficient documentation

## 2014-12-19 NOTE — Therapy (Signed)
[place], [place], [address] Phone: [phone]   Fax:  [phone]  Physical Therapy Treatment  Patient Details  Name: Cassandra Levy MRN: [id number] Date of Birth: [birth year]  Encounter Date: 12/19/2014      PT End of Session - 12/19/14 1700    Visit Number 8   Number of Visits 8   PT Start Time 8453   PT Stop Time 1655   PT Time Calculation (min) 20 min   Activity Tolerance Patient tolerated treatment well   Behavior During Therapy [facility] for tasks assessed/performed      Past Medical History  Diagnosis Date  . Thyroid disease   . Allergy   . Anemia     Past Surgical History  Procedure Laterality Date  . Colposcopy      LMP 11/25/2014  Visit Diagnosis:  Left shoulder pain - Plan: PT plan of care cert/re-cert  Pain in joint, shoulder region, left - Plan: PT plan of care cert/re-cert      Subjective Assessment - 12/19/14 1637    Symptoms L shoulder feels great; occasional discomfort/stiffness with cold weather changes   Pertinent History L shoulder pain   Limitations Lifting;House hold activities   Currently in Pain? No/denies          [facility] PT Assessment - 12/19/14 1646    Observation/Other Assessments   Focus on Therapeutic Outcomes (FOTO)  24% limited   AROM   Left Shoulder Flexion 160 Degrees   Left Shoulder ABduction 165 Degrees   Strength   Left Shoulder Flexion 5/5   Left Shoulder Extension 5/5   Left Shoulder ABduction 5/5   Left Shoulder Internal Rotation 5/5   Left Shoulder External Rotation 4+/5                  [facility] Adult PT Treatment/Exercise - 12/19/14 1638    Shoulder Exercises: ROM/Strengthening   UBE (Upper Arm Bike) UBE Level 2.0 x 6 min; 1 min forward/47mn backwards alt                PT Education - 12/19/14 1659    Education provided Yes   Education Details extensive discussion with pt re: continued community fitness and  recommendations.  Discussed appropriate return to physical therapy and safe mechanics to decrease risk of reinjury.   Person(s) Educated Patient   Methods Explanation   Comprehension Verbalized understanding          PT Short Term Goals - 12/19/14 1700    PT SHORT TERM GOAL #1   Title be independent with initial home ex program   Status Achieved   PT SHORT TERM GOAL #2   Title improved shoulder flexion to 153 needed for home and work tasks   Status Achieved   PT SHORT TERM GOAL #3   Title Patient will report her pain level improved to 3-4/10 at worst   Status Achieved           PT Long Term Goals - 12/19/14 1700    PT LONG TERM GOAL #1   Title be independent with advanced home exercise program   Status Achieved   PT LONG TERM GOAL #2   Title Improve Lt shoulder flexion and abduction to 160 degrees needed for home and work tasks   Status Achieved   PT LONG TERM GOAL #3   Title Left scapular strength improved to 4 plus/5 stabilization of the scapula while lifting heavier items for work  Status Achieved  pt no longer lifting overhead, but strength improved   PT LONG TERM GOAL #4   Title FOTO functional outcome score improved from 37% to 25% limit indicating improved function with less pain   Status Achieved               Plan - 12/19/14 1700    Clinical Impression Statement Pt has met all goals and ready for transition to community fitness.   PT Next Visit Plan d/c PT   Consulted and Agree with Plan of Care Patient        Problem List Patient Active Problem List   Diagnosis Date Noted  . Hyperthyroidism 01/20/2014  . PCOS (polycystic ovarian syndrome) 12/13/2013  . Cellulitis of left lower extremity 12/03/2013  . Cellulitis 12/03/2013  . Lower extremity edema 12/03/2013  . Obesity 10/08/2012  . Goiter 06/27/2012  . Constipation 06/27/2012   Laureen Abrahams, PT, DPT 12/19/2014 5:03 PM  [place], [place], [address] Phone: [phone]   Fax:  [phone]  PHYSICAL THERAPY DISCHARGE SUMMARY  Visits from Start of Care: 8  Current functional level related to goals / functional outcomes: See above, all goals met   Remaining deficits: N/A; pt reports occasional stiffness however relieved by stretching and heat   Education / Equipment: HEP, posture/body mechanics, transition to community fitness  Plan: Patient agrees to discharge.  Patient goals were met. Patient is being discharged due to meeting the stated rehab goals.  ?????        Laureen Abrahams, PT, DPT 12/19/2014 5:05 PM  [facility] [address] [place] [address]  [phone] (office) [phone] (fax)

## 2014-12-28 ENCOUNTER — Encounter: Payer: Self-pay | Admitting: Family Medicine

## 2015-01-04 ENCOUNTER — Other Ambulatory Visit (INDEPENDENT_AMBULATORY_CARE_PROVIDER_SITE_OTHER): Payer: Self-pay | Admitting: Surgery

## 2015-01-04 NOTE — H&P (Signed)
Cassandra Levy 01/04/2015 9:44 AM Location: [facility] Patient #: [id number] DOB: January 16, 1981 Married / Language: [place] / Race: White Female History of Present Illness Ardeth Sportsman MD; 01/04/2015 10:51 AM) Patient words: New Patient- Gallbladder.  The patient is a 34 year old female who presents with abdominal pain. Patient sent by Dr. Dareen Piano with [facility] for concern of upper abdominal pain. Decreased gallbladder ejection fraction. Concern for biliary dyskinesia. Need for cholecystectomy. Patient comes today with her husband. Pleasant morbidly obese female. Has had intermittent attacks of abdominal pain. 2 most notable. Right upper quadrant. Occasionally radiates to her flank. Usually is woke her up. Feels bloated. No nausea or vomiting. Last attack last month. Gradually worsened throughout the day. Became unbearable. Went to urgent care. Veress tender. Ultrasound showed no stones. No cholecystitis. Follow-up nuclear medicine study showed decreased gallbladder ejection fraction. 1.8%. Patient recalls getting "hormone injection". Recalls having discomforting mild pain in right upper side lasting for several hours. Milder but similar. Based on concerns of abnormal gallbladder ejection fraction, surgery recommended to consider cholecystectomy. Patient usually has bowel movement about every 2 days. No history of Crohn's. No ulcerative colitis. No inflammatory bowel disease. No irritable bowel syndrome. No intolerances to dairy or gluten. She is tried avoid heavy Rich meals. Trying to lose weight. Often gets bloated and uncomfortable if he eats heavier rich meal. Again usually right-sided. Denies any heartburn or reflux. She tried Pepto-Bismol 1 attack without much help. Not really on any antacid medicines. Can sleep supine. No problems bending over. She does not smoke. Rarely drinks alcohol. No prior abdominal surgeries. She had some  bleeding with an endometrial procedure in wonders if she is "a bleeder". No spontaneous nosebleeds. No problems with bleeding with brushing teeth. No major bleeding with cuts or scrapes. No history hemophilia. Other Problems Valli Glance, LPN; 0/45/4098 1:19 AM) Migraine Headache Thyroid Disease  Past Surgical History Valli Glance, LPN; 1/47/8295 6:21 AM) No pertinent past surgical history  Diagnostic Studies History Valli Glance, LPN; 02/20/6577 4:69 AM) Colonoscopy never Mammogram never Pap Smear 1-5 years ago  Allergies Valli Glance, LPN; 06/14/5283 1:32 AM) Amoxicillin *PENICILLINS* Swelling. Clindamycin HCl *Anti-infective Agents - Misc. Hives.  Medication History Valli Glance, LPN; 4/40/1027 2:53 AM) Hydrochlorothiazide (  Tablet, Oral) Active. Norethindrone Acetate (  Tablet, Oral) Active. MetFORMIN HCl (  Tablet, Oral) Active.  Social History Valli Glance, LPN; 6/64/4034 7:42 AM) Alcohol use Occasional alcohol use. Caffeine use Carbonated beverages, Coffee, Tea. No drug use Tobacco use Never smoker.  Family History Valli Glance, LPN; 5/95/6387 5:64 AM) Alcohol Abuse Father. Diabetes Mellitus Family Members In General. Family history unknown First Degree Relatives Hypertension Family Members In General.  Pregnancy / Birth History Valli Glance, LPN; 3/32/9518 8:41 AM) Age at menarche 12 years. Contraceptive History Oral contraceptives. Gravida 1 Maternal age 44-25 Para 0 Regular periods     Review of Systems [facility] LPN; 6/60/6301 6:01 AM) General Not Present- Appetite Loss, Chills, Fatigue, Fever, Night Sweats, Weight Gain and Weight Loss. Skin Not Present- Change in Wart/Mole, Dryness, Hives, Jaundice, New Lesions, Non-Healing Wounds, Rash and Ulcer. HEENT Present- Ringing in the Ears. Not Present- Earache, Hearing Loss, Hoarseness, Nose Bleed, Oral Ulcers, Seasonal Allergies, Sinus Pain, Sore Throat,  Visual Disturbances, Wears glasses/contact lenses and Yellow Eyes. Respiratory Present- Snoring. Not Present- Bloody sputum, Chronic Cough, Difficulty Breathing and Wheezing. Breast Not Present- Breast Mass, Breast Pain, Nipple Discharge and Skin Changes. Cardiovascular Present- Swelling of Extremities. Not Present- Chest Pain, Difficulty Breathing Lying  Down, Leg Cramps, Palpitations, Rapid Heart Rate and Shortness of Breath. Gastrointestinal Not Present- Abdominal Pain, Bloating, Bloody Stool, Change in Bowel Habits, Chronic diarrhea, Constipation, Difficulty Swallowing, Excessive gas, Gets full quickly at meals, Hemorrhoids, Indigestion, Nausea, Rectal Pain and Vomiting. Female Genitourinary Not Present- Frequency, Nocturia, Painful Urination, Pelvic Pain and Urgency. Musculoskeletal Not Present- Back Pain, Joint Pain, Joint Stiffness, Muscle Pain, Muscle Weakness and Swelling of Extremities. Neurological Not Present- Decreased Memory, Fainting, Headaches, Numbness, Seizures, Tingling, Tremor, Trouble walking and Weakness. Psychiatric Not Present- Anxiety, Bipolar, Change in Sleep Pattern, Depression, Fearful and Frequent crying. Endocrine Not Present- Cold Intolerance, Excessive Hunger, Hair Changes, Heat Intolerance, Hot flashes and New Diabetes. Hematology Not Present- Easy Bruising, Excessive bleeding, Gland problems, HIV and Persistent Infections.  Vitals [facility] LPN; 01/29/864 7:84 AM) 01/04/2015 9:44 AM Weight: 341.5 lb Height: 68in Body Surface Area: 2.73 m Body Mass Index: 51.92 kg/m Temp.: 98.73F(Oral)  Pulse: 74 (Regular)  BP: 160/78 (Sitting, Left Arm, Standard)     Physical Exam Ardeth Sportsman MD; 01/04/2015 10:29 AM)  General Mental Status-Alert. General Appearance-Not in acute distress, Not Sickly. Orientation-Oriented X3. Hydration-Well hydrated. Voice-Normal.  Integumentary Global Assessment Upon inspection and palpation of skin  surfaces of the - Axillae: non-tender, no inflammation or ulceration, no drainage. and Distribution of scalp and body hair is normal. General Characteristics Temperature - normal warmth is noted.  Head and Neck Head-normocephalic, atraumatic with no lesions or palpable masses. Face Global Assessment - atraumatic, no absence of expression. Neck Global Assessment - no abnormal movements, no bruit auscultated on the right, no bruit auscultated on the left, no decreased range of motion, non-tender. Trachea-midline. Thyroid Gland Characteristics - non-tender.  Eye Eyeball - Left-Extraocular movements intact, No Nystagmus. Eyeball - Right-Extraocular movements intact, No Nystagmus. Cornea - Left-No Hazy. Cornea - Right-No Hazy. Sclera/Conjunctiva - Left-No scleral icterus, No Discharge. Sclera/Conjunctiva - Right-No scleral icterus, No Discharge. Pupil - Left-Direct reaction to light normal. Pupil - Right-Direct reaction to light normal.  ENMT Ears Pinna - Left - no drainage observed, no generalized tenderness observed. Right - no drainage observed, no generalized tenderness observed. Nose and Sinuses External Inspection of the Nose - no destructive lesion observed. Inspection of the nares - Left - quiet respiration. Right - quiet respiration. Mouth and Throat Lips - Upper Lip - no fissures observed, no pallor noted. Lower Lip - no fissures observed, no pallor noted. Nasopharynx - no discharge present. Oral Cavity/Oropharynx - Tongue - no dryness observed. Oral Mucosa - no cyanosis observed. Hypopharynx - no evidence of airway distress observed.  Chest and Lung Exam Inspection Movements - Normal and Symmetrical. Accessory muscles - No use of accessory muscles in breathing. Palpation Palpation of the chest reveals - Non-tender. Auscultation Breath sounds - Normal and Clear.  Cardiovascular Auscultation Rhythm - Regular. Murmurs & Other Heart Sounds -  Auscultation of the heart reveals - No Murmurs and No Systolic Clicks.  Abdomen Inspection Inspection of the abdomen reveals - No Visible peristalsis and No Abnormal pulsations. Umbilicus - No Bleeding, No Urine drainage. Palpation/Percussion Palpation and Percussion of the abdomen reveal - Soft, Non Tender, No Rebound tenderness, No Rigidity (guarding) and No Cutaneous hyperesthesia. Note: Morbidly obese but soft. Mild discomfort right upper quadrant. No Murphy sign. No umbilical hernia.   Female Genitourinary Sexual Maturity Tanner 5 - Adult hair pattern. Note: No vaginal bleeding nor discharge   Peripheral Vascular Upper Extremity Inspection - Left - No Cyanotic nailbeds, Not Ischemic. Right - No Cyanotic nailbeds,  Not Ischemic.  Neurologic Neurologic evaluation reveals -normal attention span and ability to concentrate, able to name objects and repeat phrases. Appropriate fund of knowledge , normal sensation and normal coordination. Mental Status Affect - not angry, not paranoid. Cranial Nerves-Normal Bilaterally. Gait-Normal.  Neuropsychiatric Mental status exam performed with findings of-able to articulate well with normal speech/language, rate, volume and coherence, thought content normal with ability to perform basic computations and apply abstract reasoning and no evidence of hallucinations, delusions, obsessions or homicidal/suicidal ideation.  Musculoskeletal Global Assessment Spine, Ribs and Pelvis - no instability, subluxation or laxity. Right Upper Extremity - no instability, subluxation or laxity.  Lymphatic Head & Neck  General Head & Neck Lymphatics: Bilateral - Description - No Localized lymphadenopathy. Axillary  General Axillary Region: Bilateral - Description - No Localized lymphadenopathy. Femoral & Inguinal  Generalized Femoral & Inguinal Lymphatics: Left - Description - No Localized lymphadenopathy. Right - Description - No Localized  lymphadenopathy.    Assessment & Plan Ardeth Sportsman MD; 01/04/2015 10:31 AM)  CHRONIC CHOLECYSTITIS WITHOUT CALCULUS (575.11  K81.1) Impression: Her story is very suspicious for biliary dyskinesia. While she has no gallstones, she has documented decreased gallbladder ejection fraction. CCK injection did reproduce her symptoms with a similar but milder attack. She's had more than one episode.  The rest of her differential diagnosis seems unlikely. Did suggest a trial of proton pump inhibitors with Prilosec twice a day. Skeptical that will work. She denies much that can heartburn and reflux. She does not feel she needs to see gastroenterology either. No other major concerns of her digestive tract  At this point she wishes to proceed with surgery first. Reasonable to try single site technique given no stones. Might need multiple port given obesity. We will see:  The anatomy & physiology of hepatobiliary & pancreatic function was discussed. The pathophysiology of gallbladder dysfunction was discussed. Natural history risks without surgery was discussed. I feel the risks of no intervention will lead to serious problems that outweigh the operative risks; therefore, I recommended cholecystectomy to remove the pathology. I explained laparoscopic techniques with possible need for an open approach. Probable cholangiogram to evaluate the bilary tract was explained as well.  Risks such as bleeding, infection, abscess, leak, injury to other organs, need for further treatment, heart attack, death, and other risks were discussed. I noted a good likelihood this will help address the problem. Possibility that this will not correct all abdominal symptoms was explained. Goals of post-operative recovery were discussed as well. We will work to minimize complications. An educational handout further explaining the pathology and treatment options was given as well. Questions were answered. The patient expresses  understanding & wishes to proceed with surgery.  Current Plans Schedule for Surgery The anatomy & physiology of hepatobiliary & pancreatic function was discussed. The pathophysiology of gallbladder dysfunction was discussed. Natural history risks without surgery was discussed. I feel the risks of no intervention will lead to serious problems that outweigh the operative risks; therefore, I recommended cholecystectomy to remove the pathology. I explained laparoscopic techniques with possible need for an open approach. Probable cholangiogram to evaluate the bilary tract was explained as well.  Risks such as bleeding, infection, abscess, leak, injury to other organs, need for further treatment, heart attack, death, and other risks were discussed. I noted a good likelihood this will help address the problem. Possibility that this will not correct all abdominal symptoms was explained. Goals of post-operative recovery were discussed as well. We will work to  minimize complications. An educational handout further explaining the pathology and treatment options was given as well. Questions were answered. The patient expresses understanding & wishes to proceed with surgery. Pt Education - [facility] Laparosopic Post Op HCI (Adali Pennings) Pt Education - [facility] Good Bowel Health (Nalleli Largent) Pt Education - [facility] Pain Control (Imara Standiford)

## 2015-01-16 ENCOUNTER — Encounter: Payer: Self-pay | Admitting: Family Medicine

## 2015-02-03 ENCOUNTER — Other Ambulatory Visit: Payer: Self-pay | Admitting: Family Medicine

## 2015-02-27 ENCOUNTER — Ambulatory Visit (INDEPENDENT_AMBULATORY_CARE_PROVIDER_SITE_OTHER): Payer: 59 | Admitting: Emergency Medicine

## 2015-02-27 VITALS — BP 128/82 | HR 92 | Temp 98.0°F | Resp 17 | Ht 68.0 in | Wt 348.0 lb

## 2015-02-27 DIAGNOSIS — J0140 Acute pansinusitis, unspecified: Secondary | ICD-10-CM

## 2015-02-27 MED ORDER — HYDROCOD POLST-CHLORPHEN POLST 10-8 MG/5ML PO LQCR: 5.0000 mL | Freq: Two times a day (BID) | ORAL | Status: DC | PRN

## 2015-02-27 MED ORDER — LEVOFLOXACIN 500 MG PO TABS: 500.0000 mg | ORAL_TABLET | Freq: Every day | ORAL | Status: DC

## 2015-02-27 MED ORDER — PSEUDOEPHEDRINE-GUAIFENESIN ER 60-600 MG PO TB12: 1.0000 | ORAL_TABLET | Freq: Two times a day (BID) | ORAL | Status: AC

## 2015-02-27 NOTE — Progress Notes (Signed)
[facility] [address], [place] [id number] [phone]  Date:  02/27/2015   Name:  Cassandra CopaJessica L Kaluzny   DOB:  1981/11/29   MRN:  [id number]  PCP:  Tally DueGUEST, CHRIS WARREN, MD    Chief Complaint: Cough; URI; Fatigue; and Sore Throat   History of Present Illness:  Cassandra CopaJessica L Codner is a 34 y.o. very pleasant female patient who presents with the following:  Ill with purulent nasal drainage and post nasal drip Sore throat.  Non productive cough.  No wheezing or shortness of breath No fever or chills Malaise and myalgia. Ill since Friday. No improvement with over the counter medications or other home remedies.  Denies other complaint or health concern today.    Patient Active Problem List   Diagnosis Date Noted  . Hyperthyroidism 01/20/2014  . PCOS (polycystic ovarian syndrome) 12/13/2013  . Cellulitis of left lower extremity 12/03/2013  . Cellulitis 12/03/2013  . Lower extremity edema 12/03/2013  . Obesity 10/08/2012  . Goiter 06/27/2012  . Constipation 06/27/2012    Past Medical History  Diagnosis Date  . Thyroid disease   . Allergy   . Anemia     Past Surgical History  Procedure Laterality Date  . Colposcopy      History  Substance Use Topics  . Smoking status: Never Smoker   . Smokeless tobacco: Not on file  . Alcohol Use: No    Family History  Problem Relation Age of Onset  . Stroke Mother   . Leukemia Other     Allergies  Allergen Reactions  . Amoxicillin Swelling    Tightness in throat  . Cleocin [Clindamycin Hcl]     hives    Medication list has been reviewed and updated.  Current Outpatient Prescriptions on File Prior to Visit  Medication Sig Dispense Refill  . EPINEPHrine (EPIPEN) 0.3 mg/0.3 mL SOAJ injection Inject 0.3 mLs (0.3 mg total) into the muscle once. 2 Device 2  . hydrochlorothiazide (HYDRODIURIL) 25 MG tablet TAKE 1/2 MG UP TO 3 TIMES A DAY DEPENDING ON YOUR SYMPTOMS ." OV NEEDED FOR ADDITIONAL  REFILLS" 60 tablet 0  . metFORMIN (GLUCOPHAGE) 500 MG tablet Take 500 mg by mouth 2 (two) times daily with a meal.     . norethindrone (AYGESTIN) 5 MG tablet Take 10 mg by mouth daily.      No current facility-administered medications on file prior to visit.    Review of Systems:  As per HPI, otherwise negative.    Physical Examination: Filed Vitals:   02/27/15 0811  BP: 128/82  Pulse: 92  Temp: 98 F (36.7 C)  Resp: 17   Filed Vitals:   02/27/15 0811  Height: 5\' 8"  (1.727 m)  Weight: 348 lb (157.852 kg)   Body mass index is 52.93 kg/(m^2). Ideal Body Weight: Weight in (lb) to have BMI = 25: 164.1  GEN: morbid obesity, NAD, Non-toxic, A & O x 3 HEENT: Atraumatic, Normocephalic. Neck supple. No masses, No LAD. Ears and Nose: No external deformity. CV: RRR, No M/G/R. No JVD. No thrill. No extra heart sounds. PULM: CTA B, no wheezes, crackles, rhonchi. No retractions. No resp. distress. No accessory muscle use. ABD: S, NT, ND, +BS. No rebound. No HSM. EXTR: No c/c/e NEURO Normal gait.  PSYCH: Normally interactive. Conversant. Not depressed or anxious appearing.  Calm demeanor.    Assessment and Plan: Sinusitis levaquin mucinex  Signed,  Phillips OdorJeffery Yohana Bartha, MD

## 2015-02-27 NOTE — Patient Instructions (Signed)

## 2015-02-28 ENCOUNTER — Ambulatory Visit (INDEPENDENT_AMBULATORY_CARE_PROVIDER_SITE_OTHER): Payer: 59 | Admitting: Family Medicine

## 2015-02-28 VITALS — BP 134/76 | HR 108 | Temp 99.0°F | Resp 18 | Ht 69.0 in | Wt 341.0 lb

## 2015-02-28 DIAGNOSIS — K529 Noninfective gastroenteritis and colitis, unspecified: Secondary | ICD-10-CM

## 2015-02-28 DIAGNOSIS — R112 Nausea with vomiting, unspecified: Secondary | ICD-10-CM | POA: Diagnosis not present

## 2015-02-28 DIAGNOSIS — J0140 Acute pansinusitis, unspecified: Secondary | ICD-10-CM | POA: Diagnosis not present

## 2015-02-28 DIAGNOSIS — R358 Other polyuria: Secondary | ICD-10-CM

## 2015-02-28 DIAGNOSIS — K831 Obstruction of bile duct: Secondary | ICD-10-CM | POA: Diagnosis not present

## 2015-02-28 DIAGNOSIS — E049 Nontoxic goiter, unspecified: Secondary | ICD-10-CM

## 2015-02-28 DIAGNOSIS — E282 Polycystic ovarian syndrome: Secondary | ICD-10-CM

## 2015-02-28 DIAGNOSIS — R3589 Other polyuria: Secondary | ICD-10-CM

## 2015-02-28 LAB — COMPREHENSIVE METABOLIC PANEL
ALBUMIN: 3.9 g/dL (ref 3.5–5.2)
ALK PHOS: 84 U/L (ref 39–117)
ALT: 13 U/L (ref 0–35)
AST: 10 U/L (ref 0–37)
BUN: 9 mg/dL (ref 6–23)
CO2: 27 meq/L (ref 19–32)
CREATININE: 0.98 mg/dL (ref 0.50–1.10)
Calcium: 9.2 mg/dL (ref 8.4–10.5)
Chloride: 100 mEq/L (ref 96–112)
Glucose, Bld: 88 mg/dL (ref 70–99)
Potassium: 4.3 mEq/L (ref 3.5–5.3)
Sodium: 135 mEq/L (ref 135–145)
TOTAL PROTEIN: 7 g/dL (ref 6.0–8.3)
Total Bilirubin: 0.6 mg/dL (ref 0.2–1.2)

## 2015-02-28 LAB — POCT CBC
Granulocyte percent: 80.8 %G — AB (ref 37–80)
HEMATOCRIT: 46.4 % (ref 37.7–47.9)
Hemoglobin: 14.6 g/dL (ref 12.2–16.2)
Lymph, poc: 0.5 — AB (ref 0.6–3.4)
MCH, POC: 28.6 pg (ref 27–31.2)
MCHC: 31.5 g/dL — AB (ref 31.8–35.4)
MCV: 90.8 fL (ref 80–97)
MID (cbc): 0.4 (ref 0–0.9)
MPV: 8.3 fL (ref 0–99.8)
POC Granulocyte: 3.8 (ref 2–6.9)
POC LYMPH %: 10.9 % (ref 10–50)
POC MID %: 8.3 %M (ref 0–12)
Platelet Count, POC: 252 10*3/uL (ref 142–424)
RBC: 5.11 M/uL (ref 4.04–5.48)
RDW, POC: 14.2 %
WBC: 4.7 10*3/uL (ref 4.6–10.2)

## 2015-02-28 LAB — LIPASE: Lipase: <10 U/L (ref 0–75)

## 2015-02-28 LAB — POCT SEDIMENTATION RATE: POCT SED RATE: 22 mm/h (ref 0–22)

## 2015-02-28 LAB — GLUCOSE, POCT (MANUAL RESULT ENTRY): POC Glucose: 95 mg/dl (ref 70–99)

## 2015-02-28 MED ORDER — ONDANSETRON 4 MG PO TBDP
8.0000 mg | ORAL_TABLET | Freq: Once | ORAL | Status: AC
  Administered 2015-02-28: 8 mg via ORAL

## 2015-02-28 MED ORDER — ONDANSETRON 8 MG PO TBDP: 8.0000 mg | ORAL_TABLET | Freq: Three times a day (TID) | ORAL | Status: DC | PRN

## 2015-02-28 MED ORDER — ONDANSETRON 4 MG PO TBDP
4.0000 mg | ORAL_TABLET | Freq: Once | ORAL | Status: DC
Start: 2015-02-28 — End: 2015-02-28

## 2015-02-28 NOTE — Progress Notes (Signed)
Subjective:    Patient ID: Cassandra Levy, female    DOB: 1981-07-23, 34 y.o.   MRN: [id number] This chart was scribed for Cassandra Sorenson, MD by Littie Deeds, Medical Scribe. This patient was seen in Room 1 and the patient's care was started at 8:51 AM.  \ Chief Complaint  Patient presents with  . Follow-up    feeling worse today with more sxs   . Fever    100.8 started yesterday   . Nausea    HPI HPI Comments: Cassandra Levy is a 34 y.o. female with a hx of seasonal allergies who presents to the [facility] for a follow-up for URI symptoms. She was seen yesterday with URI symptoms and was diagnosed with sinusitis; she was started on Levaquin, Mucinex and Tussionex for cough. Her symptoms began 4 days ago, which include nasal drainage, postnasal drip, dry cough and sore throat.  Patient's symptoms have worsened since yesterday. She began having a fever, nausea, generalized weakness, and dry heaving since her visit yesterday. She has taken Tylenol for her fever. She took all of the medications prescribed yesterday except for Tussionex. Patient has been drinking orange juice and water, but she has not been eating due to nausea. She states she has been urinating a lot and had difficulty sleeping last night. Patient denies lightheadedness, dizziness and diarrhea, although she states she feels as if she will have diarrhea soon. She notes that her brother-in-law has been sick with similar symptoms, including nausea and diarrhea. She also denies prior abdominal surgeries and possibility of pregnancy; she has not been sexually active for several months. She is due to have a gallbladder surgery in 3 days, but she will likely have it rescheduled. Patient has not had the flu shot because she states it normally makes her sick.  PCP: Dr. Patsy Lager  Past Medical History  Diagnosis Date  . Thyroid disease   . Allergy   . Anemia    Current Outpatient Prescriptions on File Prior to  Visit  Medication Sig Dispense Refill  . chlorpheniramine-HYDROcodone (TUSSIONEX PENNKINETIC ER) 10-8 MG/5ML LQCR Take 5 mLs by mouth every 12 (twelve) hours as needed. 60 mL 0  . EPINEPHrine (EPIPEN) 0.3 mg/0.3 mL SOAJ injection Inject 0.3 mLs (0.3 mg total) into the muscle once. 2 Device 2  . hydrochlorothiazide (HYDRODIURIL) 25 MG tablet TAKE 1/2 MG UP TO 3 TIMES A DAY DEPENDING ON YOUR SYMPTOMS ." OV NEEDED FOR ADDITIONAL REFILLS" 60 tablet 0  . metFORMIN (GLUCOPHAGE) 500 MG tablet Take 500 mg by mouth 2 (two) times daily with a meal.     . norethindrone (AYGESTIN) 5 MG tablet Take 10 mg by mouth daily.     . pseudoephedrine-guaifenesin (MUCINEX D) 60-600 MG per tablet Take 1 tablet by mouth every 12 (twelve) hours. 18 tablet 0   No current facility-administered medications on file prior to visit.   Allergies  Allergen Reactions  . Amoxicillin Swelling    Tightness in throat  . Cleocin [Clindamycin Hcl]     hives     Review of Systems  Constitutional: Positive for fever.  HENT: Positive for postnasal drip and rhinorrhea.   Respiratory: Positive for cough.   Gastrointestinal: Positive for nausea. Negative for vomiting and diarrhea.  Musculoskeletal: Positive for myalgias.  Allergic/Immunologic: Positive for environmental allergies.  Neurological: Positive for weakness. Negative for dizziness and light-headedness.       Objective:  BP 134/76 mmHg  Pulse 108  Temp(Src) 99  F (37.2 C) (Oral)  Resp 18  Ht  (1.753 m)  Wt 341 lb (154.677 kg)  BMI 50.33 kg/m2  SpO2 95%  LMP 02/27/2015  Physical Exam  Constitutional: She is oriented to person, place, and time. She appears well-developed and well-nourished. No distress.  HENT:  Head: Normocephalic and atraumatic.  Right Ear: A middle ear effusion is present.  Left Ear: Tympanic membrane normal.  Nose: Nose normal.  Mouth/Throat: Oropharynx is clear and moist. No oropharyngeal exudate, posterior oropharyngeal edema,  posterior oropharyngeal erythema or tonsillar abscesses.  Nasal mucosa normal. Oropharynx normal.  Eyes: Pupils are equal, round, and reactive to light.  Neck: Neck supple. Thyromegaly present.  Thyromegaly bilaterally.  Cardiovascular: Regular rhythm, S1 normal and S2 normal.  Tachycardia present.   Pulmonary/Chest: Effort normal and breath sounds normal. No respiratory distress. She has no wheezes. She has no rales.  Abdominal: Bowel sounds are decreased. There is tenderness. There is no rebound, no guarding and no CVA tenderness.  Little bilateral pelvic TTP. Hypoactive bowel sounds.  Musculoskeletal: She exhibits no edema.  Lymphadenopathy:    She has no cervical adenopathy.  Neurological: She is alert and oriented to person, place, and time. No cranial nerve deficit.  Skin: Skin is warm and dry. No rash noted.  Psychiatric: She has a normal mood and affect. Her behavior is normal.  Nursing note and vitals reviewed.         Assessment & Plan:   Gastroenteritis, acute - Plan: POCT CBC, POCT SEDIMENTATION RATE, Comprehensive metabolic panel, POCT glucose (manual entry), Lipase, ondansetron (ZOFRAN-ODT) disintegrating tablet 8 mg, CANCELED: POCT UA - Microscopic Only, CANCELED: POCT urinalysis dipstick, CANCELED: POCT urine pregnancy, DISCONTINUED: ondansetron (ZOFRAN-ODT) disintegrating tablet 4 mg - appears to be viral - treat symptomatically and push fluids. If sxs persist after 2-3d or if sxs acutely worsen then RTIC immed.  Cholestasis  Acute pansinusitis, recurrence not specified  Non-intractable vomiting with nausea, vomiting of unspecified type - Plan: ondansetron (ZOFRAN-ODT) disintegrating tablet 8 mg, DISCONTINUED: ondansetron (ZOFRAN-ODT) disintegrating tablet 4 mg  Polyuria  PCOS (polycystic ovarian syndrome) - Plan: POCT glucose (manual entry)  Goiter  Meds ordered this encounter  Medications  . DISCONTD: ondansetron (ZOFRAN-ODT) disintegrating tablet 4 mg     Sig:   . ondansetron (ZOFRAN-ODT) disintegrating tablet 8 mg    Sig:   . ondansetron (ZOFRAN ODT) 8 MG disintegrating tablet    Sig: Take 1 tablet (8 mg total) by mouth every 8 (eight) hours as needed for nausea or vomiting.    Dispense:  20 tablet    Refill:  0    I personally performed the services described in this documentation, which was scribed in my presence. The recorded information has been reviewed and considered, and addended by me as needed.  Cassandra Sorenson, MD MPH  Results for orders placed or performed in visit on 02/28/15  Comprehensive metabolic panel  Result Value Ref Range   Sodium 135 135 - 145 mEq/L   Potassium 4.3 3.5 - 5.3 mEq/L   Chloride 100 96 - 112 mEq/L   CO2 27 19 - 32 mEq/L   Glucose, Bld 88 70 - 99 mg/dL   BUN 9 6 - 23 mg/dL   Creat 1.30 8.65 - 7.84 mg/dL   Total Bilirubin 0.6 0.2 - 1.2 mg/dL   Alkaline Phosphatase 84 39 - 117 U/L   AST 10 0 - 37 U/L   ALT 13 0 - 35 U/L   Total Protein 7.0  6.0 - 8.3 g/dL   Albumin 3.9 3.5 - 5.2 g/dL   Calcium 9.2 8.4 - 16.110.5 mg/dL  Lipase  Result Value Ref Range   Lipase <10 0 - 75 U/L  POCT CBC  Result Value Ref Range   WBC 4.7 4.6 - 10.2 K/uL   Lymph, poc 0.5 (A) 0.6 - 3.4   POC LYMPH PERCENT 10.9 10 - 50 %L   MID (cbc) 0.4 0 - 0.9   POC MID % 8.3 0 - 12 %M   POC Granulocyte 3.8 2 - 6.9   Granulocyte percent 80.8 (A) 37 - 80 %G   RBC 5.11 4.04 - 5.48 M/uL   Hemoglobin 14.6 12.2 - 16.2 g/dL   HCT, POC 09.646.4 04.537.7 - 47.9 %   MCV 90.8 80 - 97 fL   MCH, POC 28.6 27 - 31.2 pg   MCHC 31.5 (A) 31.8 - 35.4 g/dL   RDW, POC 40.914.2 %   Platelet Count, POC 252 142 - 424 K/uL   MPV 8.3 0 - 99.8 fL  POCT SEDIMENTATION RATE  Result Value Ref Range   POCT SED RATE 22 0 - 22 mm/hr  POCT glucose (manual entry)  Result Value Ref Range   POC Glucose 95 70 - 99 mg/dl

## 2015-02-28 NOTE — Patient Instructions (Signed)
Viral Gastroenteritis Viral gastroenteritis is also known as stomach flu. This condition affects the stomach and intestinal tract. It can cause sudden diarrhea and vomiting. The illness typically lasts 3 to 8 days. Most people develop an immune response that eventually gets rid of the virus. While this natural response develops, the virus can make you quite ill. CAUSES  Many different viruses can cause gastroenteritis, such as rotavirus or noroviruses. You can catch one of these viruses by consuming contaminated food or water. You may also catch a virus by sharing utensils or other personal items with an infected person or by touching a contaminated surface. SYMPTOMS  The most common symptoms are diarrhea and vomiting. These problems can cause a severe loss of body fluids (dehydration) and a body salt (electrolyte) imbalance. Other symptoms may include: Fever. Headache. Fatigue. Abdominal pain. DIAGNOSIS  Your caregiver can usually diagnose viral gastroenteritis based on your symptoms and a physical exam. A stool sample may also be taken to test for the presence of viruses or other infections. TREATMENT  This illness typically goes away on its own. Treatments are aimed at rehydration. The most serious cases of viral gastroenteritis involve vomiting so severely that you are not able to keep fluids down. In these cases, fluids must be given through an intravenous line (IV). HOME CARE INSTRUCTIONS  Drink enough fluids to keep your urine clear or pale yellow. Drink small amounts of fluids frequently and increase the amounts as tolerated. Ask your caregiver for specific rehydration instructions. Avoid: Foods high in sugar. Alcohol. Carbonated drinks. Tobacco. Juice. Caffeine drinks. Extremely hot or cold fluids. Fatty, greasy foods. Too much intake of anything at one time. Dairy products until 24 to 48 hours after diarrhea stops. You may consume probiotics. Probiotics are active cultures of  beneficial bacteria. They may lessen the amount and number of diarrheal stools in adults. Probiotics can be found in yogurt with active cultures and in supplements. Wash your hands well to avoid spreading the virus. Only take over-the-counter or prescription medicines for pain, discomfort, or fever as directed by your caregiver. Do not give aspirin to children. Antidiarrheal medicines are not recommended. Ask your caregiver if you should continue to take your regular prescribed and over-the-counter medicines. Keep all follow-up appointments as directed by your caregiver. SEEK IMMEDIATE MEDICAL CARE IF:  You are unable to keep fluids down. You do not urinate at least once every 6 to 8 hours. You develop shortness of breath. You notice blood in your stool or vomit. This may look like coffee grounds. You have abdominal pain that increases or is concentrated in one small area (localized). You have persistent vomiting or diarrhea. You have a fever. The patient is a child younger than 3 months, and he or she has a fever. The patient is a child older than 3 months, and he or she has a fever and persistent symptoms. The patient is a child older than 3 months, and he or she has a fever and symptoms suddenly get worse. The patient is a baby, and he or she has no tears when crying. MAKE SURE YOU:  Understand these instructions. Will watch your condition. Will get help right away if you are not doing well or get worse. Document Released: 12/02/2005 Document Revised: 02/24/2012 Document Reviewed: 09/18/2011 Exit[employer] Patient Information 2015 [employer]. This information is not intended to replace advice given to you by your health care provider. Make sure you discuss any questions you have with your health care provider. Dehydration,   Adult Dehydration is when you lose more fluids from the body than you take in. Vital organs like the kidneys, brain, and heart cannot function without a proper amount of  fluids and salt. Any loss of fluids from the body can cause dehydration.  CAUSES   Vomiting.  Diarrhea.  Excessive sweating.  Excessive urine output.  Fever. SYMPTOMS  Mild dehydration  Thirst.  Dry lips.  Slightly dry mouth. Moderate dehydration  Very dry mouth.  Sunken eyes.  Skin does not bounce back quickly when lightly pinched and released.  Dark urine and decreased urine production.  Decreased tear production.  Headache. Severe dehydration  Very dry mouth.  Extreme thirst.  Rapid, weak pulse (more than 100 beats per minute at rest).  Cold hands and feet.  Not able to sweat in spite of heat and temperature.  Rapid breathing.  Blue lips.  Confusion and lethargy.  Difficulty being awakened.  Minimal urine production.  No tears. DIAGNOSIS  Your caregiver will diagnose dehydration based on your symptoms and your exam. Blood and urine tests will help confirm the diagnosis. The diagnostic evaluation should also identify the cause of dehydration. TREATMENT  Treatment of mild or moderate dehydration can often be done at home by increasing the amount of fluids that you drink. It is best to drink small amounts of fluid more often. Drinking too much at one time can make vomiting worse. Refer to the home care instructions below. Severe dehydration needs to be treated at the hospital where you will probably be given intravenous (IV) fluids that contain water and electrolytes. HOME CARE INSTRUCTIONS   Ask your caregiver about specific rehydration instructions.  Drink enough fluids to keep your urine clear or pale yellow.  Drink small amounts frequently if you have nausea and vomiting.  Eat as you normally do.  Avoid:  Foods or drinks high in sugar.  Carbonated drinks.  Juice.  Extremely hot or cold fluids.  Drinks with caffeine.  Fatty, greasy foods.  Alcohol.  Tobacco.  Overeating.  Gelatin desserts.  Wash your hands well to avoid  spreading bacteria and viruses.  Only take over-the-counter or prescription medicines for pain, discomfort, or fever as directed by your caregiver.  Ask your caregiver if you should continue all prescribed and over-the-counter medicines.  Keep all follow-up appointments with your caregiver. SEEK MEDICAL CARE IF:  You have abdominal pain and it increases or stays in one area (localizes).  You have a rash, stiff neck, or severe headache.  You are irritable, sleepy, or difficult to awaken.  You are weak, dizzy, or extremely thirsty. SEEK IMMEDIATE MEDICAL CARE IF:   You are unable to keep fluids down or you get worse despite treatment.  You have frequent episodes of vomiting or diarrhea.  You have blood or green matter (bile) in your vomit.  You have blood in your stool or your stool looks black and tarry.  You have not urinated in 6 to 8 hours, or you have only urinated a small amount of very dark urine.  You have a fever.  You faint. MAKE SURE YOU:   Understand these instructions.  Will watch your condition.  Will get help right away if you are not doing well or get worse. Document Released: 12/02/2005 Document Revised: 02/24/2012 Document Reviewed: 07/22/2011 Exit[employer] Patient Information 2015 [employer]. This information is not intended to replace advice given to you by your health care provider. Make sure you discuss any questions you have with your health care   provider.  

## 2015-03-27 ENCOUNTER — Ambulatory Visit (INDEPENDENT_AMBULATORY_CARE_PROVIDER_SITE_OTHER): Payer: 59 | Admitting: Family Medicine

## 2015-03-27 VITALS — BP 142/90 | HR 95 | Temp 98.0°F | Resp 18 | Ht 67.75 in

## 2015-03-27 DIAGNOSIS — R05 Cough: Secondary | ICD-10-CM | POA: Diagnosis not present

## 2015-03-27 DIAGNOSIS — J029 Acute pharyngitis, unspecified: Secondary | ICD-10-CM | POA: Diagnosis not present

## 2015-03-27 DIAGNOSIS — R059 Cough, unspecified: Secondary | ICD-10-CM

## 2015-03-27 DIAGNOSIS — E049 Nontoxic goiter, unspecified: Secondary | ICD-10-CM | POA: Diagnosis not present

## 2015-03-27 DIAGNOSIS — J019 Acute sinusitis, unspecified: Secondary | ICD-10-CM | POA: Diagnosis not present

## 2015-03-27 DIAGNOSIS — J329 Chronic sinusitis, unspecified: Secondary | ICD-10-CM

## 2015-03-27 DIAGNOSIS — R0982 Postnasal drip: Secondary | ICD-10-CM

## 2015-03-27 MED ORDER — CEFDINIR 300 MG PO CAPS: 600.0000 mg | ORAL_CAPSULE | Freq: Every day | ORAL | Status: DC

## 2015-03-27 MED ORDER — IPRATROPIUM BROMIDE 0.03 % NA SOLN: 2.0000 | Freq: Two times a day (BID) | NASAL | Status: DC

## 2015-03-27 MED ORDER — HYDROCODONE-HOMATROPINE 5-1.5 MG/5ML PO SYRP: 5.0000 mL | ORAL_SOLUTION | ORAL | Status: DC | PRN

## 2015-03-27 NOTE — Patient Instructions (Signed)
Drink plenty of fluids and try to get enough rest  Take the cough syrup at bedtime if needed for cough. It has some hydrocodone in it which is a excellent cough suppressant but also relieves pain, and should help the nighttime cough induced chest discomfort.  He is the Atrovent nasal spray 2 sprays each nostril up to 4 times daily if needed for head congestion  Continue over-the-counter Claritin  Take the Omnicef (cefdinir) one twice daily for infection. Always be aware if you should have an allergic problem to go to the emergency room or call 911 if needed.  If not improving over the next week please return so we can see if any the else can be done prior to your schedule surgery.

## 2015-03-27 NOTE — Progress Notes (Signed)
Subjective: 34 year old lady who was treated a month ago for a flu. After that she cut doing better but had a cough for a while and then now has developed more head congestion. She is stuffy especially in the morning. Not blowing a lot of mucus. She has soreness of her throat. She is not coughing up a lot of mucus. She does cough enough that she gets chest wall pain. She does not smoke. She's not been febrile. She works a job for [employer], primarily desk type work. She's been fatigued. She doesn't feel like she can exercise. She is about 2 weeks away from a scheduled cholecystectomy, and doesn't want to be ill at that time. She does have some discomfort in the facial area.  Objective: Pleasant lady, alert and oriented. She is overweight. Her TMs are normal. Throat not erythematous. Neck supple without any significant nodes. Thyroid is 2 times enlarged. Chest is clear to auscultation. Heart regular without murmurs. She does have some mild tenderness on the frontal and maxillary sinus area, not severely so  Assessment: Pharyngitis and cough Postnasal drainage This may be sinus related, a secondary problem post her previous flu. It also could be seasonal allergies but she was tested for allergies last year and not found to have seasonal allergens. It could be a repeat varus, or bacterial infection since.  Plan: With the upcoming surgery I think it is most appropriate to air on the side of over treatment. If she is not improving she can come back. However do not believe that laboratory testing will change what we do today.  Discussed risks of cephalosporins with the history of amoxicillin allergy.  Cautioned to call 911 or go to ER if concerns ever.  rx omnicef

## 2015-03-31 ENCOUNTER — Telehealth: Payer: Self-pay

## 2015-03-31 NOTE — Telephone Encounter (Signed)
Sharrie Rothmanonya Denny, RN [facility] left voicemail on Thursday at 3:22pm requesting last office notes and labs faxed to [phone] to review for upcoming surgery. Cb# [phone] ext [phone].

## 2015-03-31 NOTE — Telephone Encounter (Signed)
Records faxed.

## 2015-04-07 ENCOUNTER — Other Ambulatory Visit: Payer: Self-pay | Admitting: Surgery

## 2015-05-26 ENCOUNTER — Encounter (HOSPITAL_COMMUNITY): Payer: Self-pay | Admitting: Emergency Medicine

## 2015-05-26 ENCOUNTER — Emergency Department (HOSPITAL_COMMUNITY): Payer: No Typology Code available for payment source

## 2015-05-26 ENCOUNTER — Emergency Department (HOSPITAL_COMMUNITY)
Admission: EM | Admit: 2015-05-26 | Discharge: 2015-05-27 | Disposition: A | Discharge status: Home / Self Care | Payer: No Typology Code available for payment source | Attending: Emergency Medicine | Admitting: Emergency Medicine

## 2015-05-26 DIAGNOSIS — S79922A Unspecified injury of left thigh, initial encounter: Secondary | ICD-10-CM | POA: Diagnosis not present

## 2015-05-26 DIAGNOSIS — S199XXA Unspecified injury of neck, initial encounter: Secondary | ICD-10-CM | POA: Insufficient documentation

## 2015-05-26 DIAGNOSIS — M7918 Myalgia, other site: Secondary | ICD-10-CM

## 2015-05-26 DIAGNOSIS — S0083XA Contusion of other part of head, initial encounter: Secondary | ICD-10-CM | POA: Insufficient documentation

## 2015-05-26 DIAGNOSIS — Y998 Other external cause status: Secondary | ICD-10-CM | POA: Diagnosis not present

## 2015-05-26 DIAGNOSIS — Y92410 Unspecified street and highway as the place of occurrence of the external cause: Secondary | ICD-10-CM | POA: Insufficient documentation

## 2015-05-26 DIAGNOSIS — Y9389 Activity, other specified: Secondary | ICD-10-CM | POA: Diagnosis not present

## 2015-05-26 DIAGNOSIS — H9209 Otalgia, unspecified ear: Secondary | ICD-10-CM | POA: Diagnosis not present

## 2015-05-26 DIAGNOSIS — Z79899 Other long term (current) drug therapy: Secondary | ICD-10-CM | POA: Insufficient documentation

## 2015-05-26 DIAGNOSIS — S40812A Abrasion of left upper arm, initial encounter: Secondary | ICD-10-CM | POA: Insufficient documentation

## 2015-05-26 DIAGNOSIS — S3991XA Unspecified injury of abdomen, initial encounter: Secondary | ICD-10-CM | POA: Diagnosis not present

## 2015-05-26 DIAGNOSIS — S0993XA Unspecified injury of face, initial encounter: Secondary | ICD-10-CM | POA: Diagnosis present

## 2015-05-26 DIAGNOSIS — E669 Obesity, unspecified: Secondary | ICD-10-CM | POA: Insufficient documentation

## 2015-05-26 DIAGNOSIS — S0990XA Unspecified injury of head, initial encounter: Secondary | ICD-10-CM | POA: Diagnosis not present

## 2015-05-26 DIAGNOSIS — Z862 Personal history of diseases of the blood and blood-forming organs and certain disorders involving the immune mechanism: Secondary | ICD-10-CM | POA: Insufficient documentation

## 2015-05-26 DIAGNOSIS — T148XXA Other injury of unspecified body region, initial encounter: Secondary | ICD-10-CM

## 2015-05-26 DIAGNOSIS — Z880 Allergy status to penicillin: Secondary | ICD-10-CM | POA: Insufficient documentation

## 2015-05-26 MED ORDER — OXYCODONE-ACETAMINOPHEN 5-325 MG PO TABS
1.0000 | ORAL_TABLET | Freq: Once | ORAL | Status: AC
  Administered 2015-05-27: 1 via ORAL
  Filled 2015-05-26: qty 1
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
## 2015-05-26 NOTE — ED Notes (Signed)
EDP at bedside  

## 2015-05-27 ENCOUNTER — Other Ambulatory Visit (HOSPITAL_COMMUNITY): Payer: Self-pay

## 2015-05-27 ENCOUNTER — Other Ambulatory Visit: Payer: Self-pay | Admitting: Family Medicine

## 2015-05-27 ENCOUNTER — Emergency Department (HOSPITAL_COMMUNITY): Payer: No Typology Code available for payment source

## 2015-05-27 DIAGNOSIS — S0083XA Contusion of other part of head, initial encounter: Secondary | ICD-10-CM | POA: Diagnosis not present

## 2015-05-27 LAB — CBC WITH DIFFERENTIAL/PLATELET
BASOS ABS: 0 10*3/uL (ref 0.0–0.1)
Basophils Relative: 0 % (ref 0–1)
Eosinophils Absolute: 0.2 10*3/uL (ref 0.0–0.7)
Eosinophils Relative: 2 % (ref 0–5)
HCT: 42.4 % (ref 36.0–46.0)
HEMOGLOBIN: 14 g/dL (ref 12.0–15.0)
LYMPHS PCT: 21 % (ref 12–46)
Lymphs Abs: 2.5 10*3/uL (ref 0.7–4.0)
MCH: 29.3 pg (ref 26.0–34.0)
MCHC: 33 g/dL (ref 30.0–36.0)
MCV: 88.7 fL (ref 78.0–100.0)
MONO ABS: 1 10*3/uL (ref 0.1–1.0)
Monocytes Relative: 8 % (ref 3–12)
NEUTROS ABS: 8.4 10*3/uL — AB (ref 1.7–7.7)
Neutrophils Relative %: 69 % (ref 43–77)
Platelets: 322 10*3/uL (ref 150–400)
RBC: 4.78 MIL/uL (ref 3.87–5.11)
RDW: 13.6 % (ref 11.5–15.5)
WBC: 12.1 10*3/uL — ABNORMAL HIGH (ref 4.0–10.5)

## 2015-05-27 LAB — COMPREHENSIVE METABOLIC PANEL
ALBUMIN: 3.9 g/dL (ref 3.5–5.0)
ALT: 22 U/L (ref 14–54)
AST: 19 U/L (ref 15–41)
Alkaline Phosphatase: 70 U/L (ref 38–126)
Anion gap: 10 (ref 5–15)
BILIRUBIN TOTAL: 0.9 mg/dL (ref 0.3–1.2)
BUN: 14 mg/dL (ref 6–20)
CO2: 23 mmol/L (ref 22–32)
Calcium: 9 mg/dL (ref 8.9–10.3)
Chloride: 102 mmol/L (ref 101–111)
Creatinine, Ser: 0.83 mg/dL (ref 0.44–1.00)
GFR calc non Af Amer: >60 mL/min (ref >60)
GLUCOSE: 86 mg/dL (ref 65–99)
Potassium: 3.3 mmol/L — ABNORMAL LOW (ref 3.5–5.1)
SODIUM: 135 mmol/L (ref 135–145)
Total Protein: 7.5 g/dL (ref 6.5–8.1)

## 2015-05-27 MED ORDER — IBUPROFEN 600 MG PO TABS: 600.0000 mg | ORAL_TABLET | Freq: Four times a day (QID) | ORAL | Status: DC | PRN

## 2015-05-27 MED ORDER — OXYCODONE-ACETAMINOPHEN 5-325 MG PO TABS: 1.0000 | ORAL_TABLET | Freq: Four times a day (QID) | ORAL | Status: DC | PRN

## 2015-05-27 NOTE — ED Notes (Signed)
Pt still in x-ray
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
## 2015-05-27 NOTE — ED Notes (Signed)
Pt ambulated with steady gait
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
## 2015-06-21 ENCOUNTER — Other Ambulatory Visit: Payer: Self-pay | Admitting: Family Medicine

## 2015-10-03 ENCOUNTER — Ambulatory Visit (INDEPENDENT_AMBULATORY_CARE_PROVIDER_SITE_OTHER): Payer: 59 | Admitting: Emergency Medicine

## 2015-10-03 VITALS — BP 132/84 | HR 78 | Temp 98.9°F | Resp 16 | Ht 69.25 in | Wt 344.0 lb

## 2015-10-03 DIAGNOSIS — E0590 Thyrotoxicosis, unspecified without thyrotoxic crisis or storm: Secondary | ICD-10-CM

## 2015-10-03 DIAGNOSIS — R1010 Upper abdominal pain, unspecified: Secondary | ICD-10-CM | POA: Diagnosis not present

## 2015-10-03 DIAGNOSIS — J302 Other seasonal allergic rhinitis: Secondary | ICD-10-CM | POA: Diagnosis not present

## 2015-10-03 LAB — CBC
HEMATOCRIT: 42.7 % (ref 36.0–46.0)
Hemoglobin: 14.3 g/dL (ref 12.0–15.0)
MCH: 29.9 pg (ref 26.0–34.0)
MCHC: 33.5 g/dL (ref 30.0–36.0)
MCV: 89.1 fL (ref 78.0–100.0)
MPV: 10.5 fL (ref 8.6–12.4)
PLATELETS: 352 10*3/uL (ref 150–400)
RBC: 4.79 MIL/uL (ref 3.87–5.11)
RDW: 13.5 % (ref 11.5–15.5)
WBC: 6.2 10*3/uL (ref 4.0–10.5)

## 2015-10-03 LAB — COMPREHENSIVE METABOLIC PANEL
ALK PHOS: 73 U/L (ref 33–115)
ALT: 17 U/L (ref 6–29)
AST: 13 U/L (ref 10–30)
Albumin: 3.9 g/dL (ref 3.6–5.1)
BUN: 9 mg/dL (ref 7–25)
CO2: 27 mmol/L (ref 20–31)
Calcium: 9 mg/dL (ref 8.6–10.2)
Chloride: 106 mmol/L (ref 98–110)
Creat: 0.8 mg/dL (ref 0.50–1.10)
Glucose, Bld: 90 mg/dL (ref 65–99)
Potassium: 4.4 mmol/L (ref 3.5–5.3)
SODIUM: 138 mmol/L (ref 135–146)
Total Bilirubin: 0.8 mg/dL (ref 0.2–1.2)
Total Protein: 6.6 g/dL (ref 6.1–8.1)

## 2015-10-03 LAB — TSH: TSH: 2.192 u[IU]/mL (ref 0.350–4.500)

## 2015-10-03 LAB — LIPID PANEL
Cholesterol: 173 mg/dL (ref 125–200)
HDL: 31 mg/dL — ABNORMAL LOW (ref >=46)
LDL Cholesterol: 127 mg/dL (ref <130)
Total CHOL/HDL Ratio: 5.6 Ratio — ABNORMAL HIGH (ref <=5.0)
Triglycerides: 76 mg/dL (ref <150)
VLDL: 15 mg/dL (ref <30)

## 2015-10-03 LAB — LIPASE: Lipase: 7 U/L (ref 7–60)

## 2015-10-03 LAB — AMYLASE: AMYLASE: 10 U/L (ref 0–105)

## 2015-10-03 MED ORDER — POLYETHYLENE GLYCOL 3350 17 GM/SCOOP PO POWD: 17.0000 g | Freq: Every day | ORAL | Status: DC

## 2015-10-03 MED ORDER — TRIAMCINOLONE ACETONIDE 55 MCG/ACT NA AERO: 2.0000 | INHALATION_SPRAY | Freq: Every day | NASAL | Status: DC

## 2015-10-03 MED ORDER — ESOMEPRAZOLE MAGNESIUM 40 MG PO CPDR: 40.0000 mg | DELAYED_RELEASE_CAPSULE | Freq: Every day | ORAL | Status: DC

## 2015-10-03 NOTE — Progress Notes (Signed)
Subjective:  Patient ID: Cassandra Levy, female    DOB: November 06, 1981  Age: 34 y.o. MRN: [id number]  CC: Abdominal Pain and Sinusitis   HPI Cassandra Levy presents  6 months following a cholecystectomy for chronic cholecystitis. She describes a recent onset of "gassiness" she been belching and passing gas frequently. She isn't convinced it is related to diet. She has no nausea vomiting no stool change. She's been rubbing her stomach and rubbing her right upper quadrant quite a bit thinking that that would help and now she has some discomfort. Her appetite is normal she has no food intolerance she has no fever or chills. She does have some nasal congestion postnasal drainage is watery in character. She has no cough or sore throat or postnasal drainage  History Cassandra Levy has a past medical history of Thyroid disease; Allergy; and Anemia.   She has past surgical history that includes Colposcopy and Cholecystectomy.   Her  family history includes Leukemia in her other; Stroke in her mother.  She   reports that she has never smoked. She does not have any smokeless tobacco history on file. She reports that she does not drink alcohol or use illicit drugs.  Outpatient Prescriptions Prior to Visit  Medication Sig Dispense Refill  . EPINEPHrine (EPIPEN) 0.3 mg/0.3 mL SOAJ injection Inject 0.3 mLs (0.3 mg total) into the muscle once. 2 Device 2  . hydrochlorothiazide (HYDRODIURIL) 25 MG tablet TAKE 1/2 TAB UP TO 3 TIMES A DAY DEPENDING ON YOUR SYMPTOMS.  "NO MORE REFILLS WITHOUT OV" 60 tablet 0  . ibuprofen (ADVIL,MOTRIN) 600 MG tablet Take 1 tablet (600 mg total) by mouth every 6 (six) hours as needed. 30 tablet 0  . metFORMIN (GLUCOPHAGE) 500 MG tablet Take 500 mg by mouth 2 (two) times daily with a meal.     . norethindrone (AYGESTIN) 5 MG tablet Take 10 mg by mouth daily.     . cefdinir (OMNICEF) 300 MG capsule Take 2 capsules (600 mg total) by mouth daily. (Patient not taking: Reported on  05/27/2015) 20 capsule 0  . chlorpheniramine-HYDROcodone (TUSSIONEX PENNKINETIC ER) 10-8 MG/5ML [facility] Take 5 mLs by mouth every 12 (twelve) hours as needed. (Patient not taking: Reported on 03/27/2015) 60 mL 0  . HYDROcodone-homatropine (HYCODAN) 5-1.5 MG/5ML syrup Take 5 mLs by mouth every 4 (four) hours as needed. (Patient not taking: Reported on 05/27/2015) 120 mL 0  . ipratropium (ATROVENT) 0.03 % nasal spray Place 2 sprays into the nose 2 (two) times daily. (Patient not taking: Reported on 05/27/2015) 30 mL 0  . ondansetron (ZOFRAN ODT) 8 MG disintegrating tablet Take 1 tablet (8 mg total) by mouth every 8 (eight) hours as needed for nausea or vomiting. (Patient not taking: Reported on 03/27/2015) 20 tablet 0  . oxyCODONE-acetaminophen (PERCOCET/ROXICET) 5-325 MG per tablet Take 1 tablet by mouth every 6 (six) hours as needed for severe pain. (Patient not taking: Reported on 10/03/2015) 10 tablet 0  . pseudoephedrine-guaifenesin (MUCINEX D) 60-600 MG per tablet Take 1 tablet by mouth every 12 (twelve) hours. (Patient not taking: Reported on 03/27/2015) 18 tablet 0   No facility-administered medications prior to visit.    Social History   Social History  . Marital Status: Married    Spouse Name: N/A  . Number of Children: N/A  . Years of Education: N/A   Social History Main Topics  . Smoking status: Never Smoker   . Smokeless tobacco: None  . Alcohol Use: No  . Drug  Use: No  . Sexual Activity: Not Asked   Other Topics Concern  . None   Social History Narrative     Review of Systems  Constitutional: Negative for fever, chills and appetite change.  HENT: Negative for congestion, ear pain, postnasal drip, sinus pressure and sore throat.   Eyes: Negative for pain and redness.  Respiratory: Negative for cough, shortness of breath and wheezing.   Cardiovascular: Negative for leg swelling.  Gastrointestinal: Positive for abdominal pain and abdominal distention. Negative for nausea,  vomiting, diarrhea, constipation and blood in stool.  Endocrine: Negative for polyuria.  Genitourinary: Negative for dysuria, urgency, frequency and flank pain.  Musculoskeletal: Negative for gait problem.  Skin: Negative for rash.  Neurological: Negative for weakness and headaches.  Psychiatric/Behavioral: Negative for confusion and decreased concentration. The patient is not nervous/anxious.     Objective:  BP 132/84 mmHg  Pulse 78  Temp(Src) 98.9 F (37.2 C) (Oral)  Resp 16  Ht 5' 9.25" (1.759 m)  Wt 344 lb (156.037 kg)  BMI 50.43 kg/m2  SpO2 98%  LMP 09/29/2015  Physical Exam  Constitutional: She is oriented to person, place, and time. She appears well-developed and well-nourished. No distress.  HENT:  Head: Normocephalic and atraumatic.  Right Ear: External ear normal.  Left Ear: External ear normal.  Nose: Nose normal.  Eyes: Conjunctivae and EOM are normal. Pupils are equal, round, and reactive to light. No scleral icterus.  Neck: Normal range of motion. Neck supple. No tracheal deviation present.  Cardiovascular: Normal rate, regular rhythm and normal heart sounds.   Pulmonary/Chest: Effort normal. No respiratory distress. She has no wheezes. She has no rales.  Abdominal: She exhibits no mass. There is no tenderness. There is no rebound and no guarding.  Musculoskeletal: She exhibits no edema.  Lymphadenopathy:    She has no cervical adenopathy.  Neurological: She is alert and oriented to person, place, and time. Coordination normal.  Skin: Skin is warm and dry. No rash noted.  Psychiatric: She has a normal mood and affect. Her behavior is normal.      Assessment & Plan:   Cassandra Levy was seen today for abdominal pain and sinusitis.  Diagnoses and all orders for this visit:  Pain of upper abdomen -     CBC -     Comprehensive metabolic panel -     Lipid panel -     Amylase -     Lipase  Morbid obesity, unspecified obesity type (HCC)  Seasonal allergic  rhinitis  Hyperthyroidism -     TSH  Other orders -     triamcinolone (NASACORT AQ) 55 MCG/ACT AERO nasal inhaler; Place 2 sprays into the nose daily. -     polyethylene glycol powder (GLYCOLAX/MIRALAX) powder; Take 17 g by mouth daily. -     esomeprazole (NEXIUM) 40 MG capsule; Take 1 capsule (40 mg total) by mouth daily at 12 noon.   I am having Ms. Test start on triamcinolone, polyethylene glycol powder, and esomeprazole. I am also having her maintain her norethindrone, metFORMIN, EPINEPHrine, pseudoephedrine-guaifenesin, chlorpheniramine-HYDROcodone, ondansetron, cefdinir, ipratropium, HYDROcodone-homatropine, oxyCODONE-acetaminophen, ibuprofen, and hydrochlorothiazide.  Meds ordered this encounter  Medications  . triamcinolone (NASACORT AQ) 55 MCG/ACT AERO nasal inhaler    Sig: Place 2 sprays into the nose daily.    Dispense:  1 Inhaler    Refill:  12  . polyethylene glycol powder (GLYCOLAX/MIRALAX) powder    Sig: Take 17 g by mouth daily.    Dispense:  3350  g    Refill:  1  . esomeprazole (NEXIUM) 40 MG capsule    Sig: Take 1 capsule (40 mg total) by mouth daily at 12 noon.    Dispense:  30 capsule    Refill:  5    Appropriate red flag conditions were discussed with the patient as well as actions that should be taken.  Patient expressed his understanding.  Follow-up: Return if symptoms worsen or fail to improve.  Carmelina DaneAnderson, Jadden Yim S, MD

## 2015-10-03 NOTE — Patient Instructions (Signed)

## 2015-11-06 ENCOUNTER — Other Ambulatory Visit: Payer: Self-pay | Admitting: Physician Assistant

## 2016-04-15 IMAGING — US US ABDOMEN COMPLETE
1 series · 14 of 25 positions shown · non-contrast
Comparison: None.

CLINICAL DATA: Right upper quadrant pain, morbid obesity

EXAM:
ULTRASOUND ABDOMEN COMPLETE

[Series 1: us abdomen complete · 0.47mm/px · 14 of 79 slices shown]
[im 1/79]
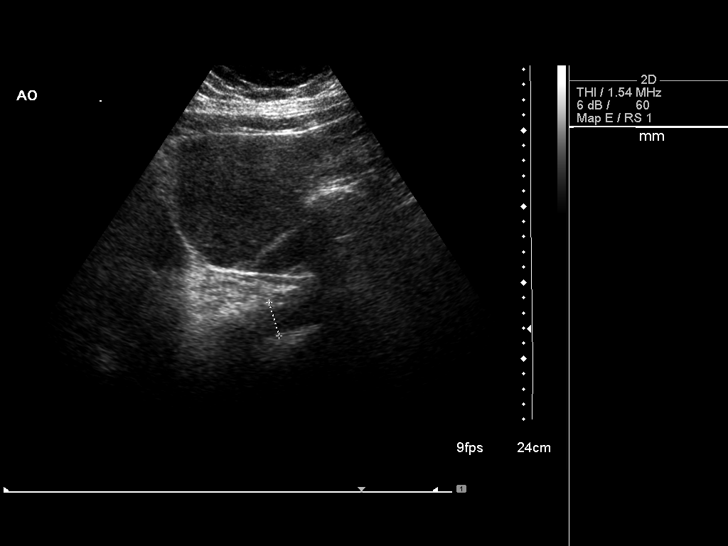
[im 7/79]
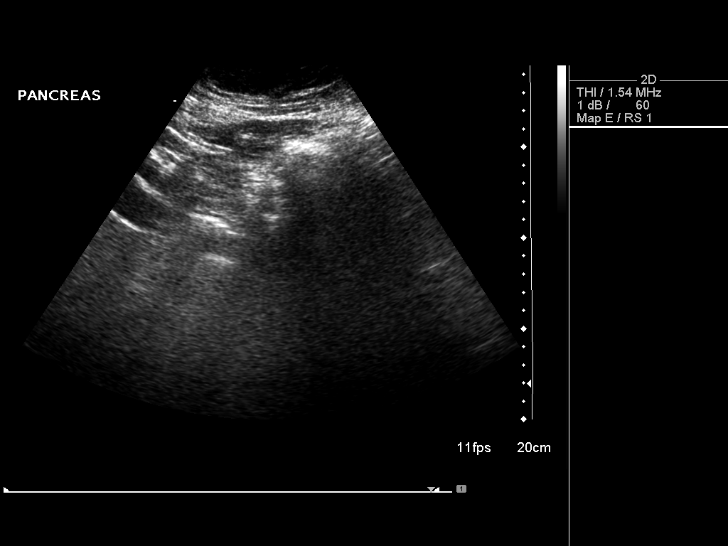
[im 14/79]
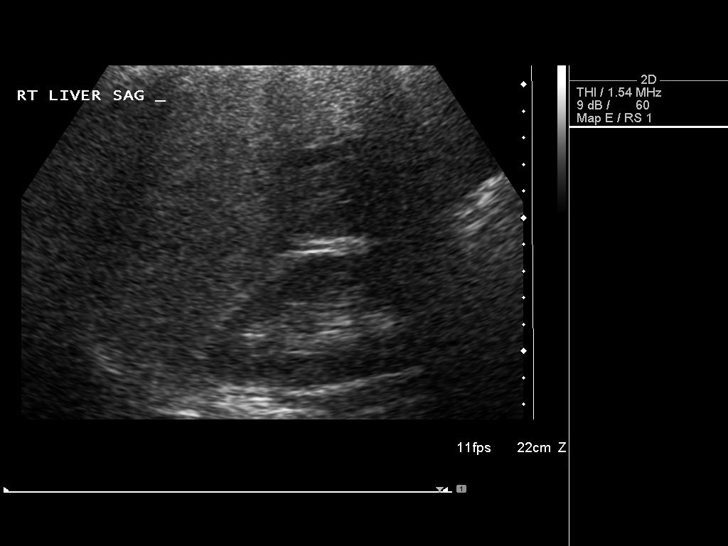
[im 20/79]
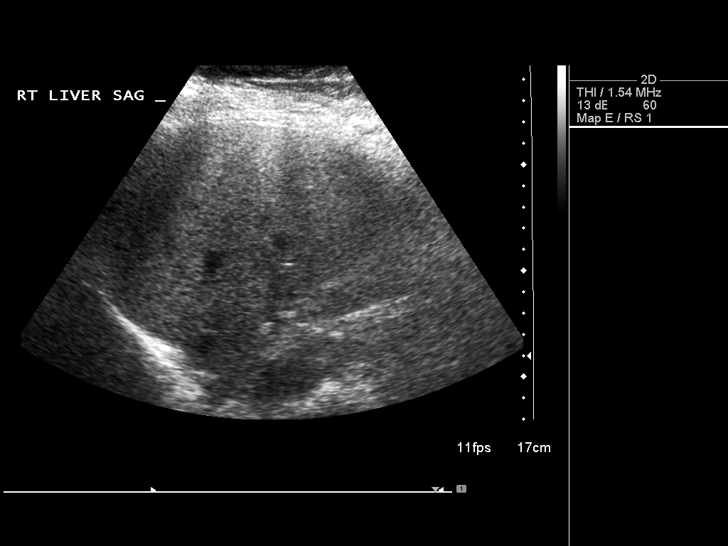
[im 27/79]
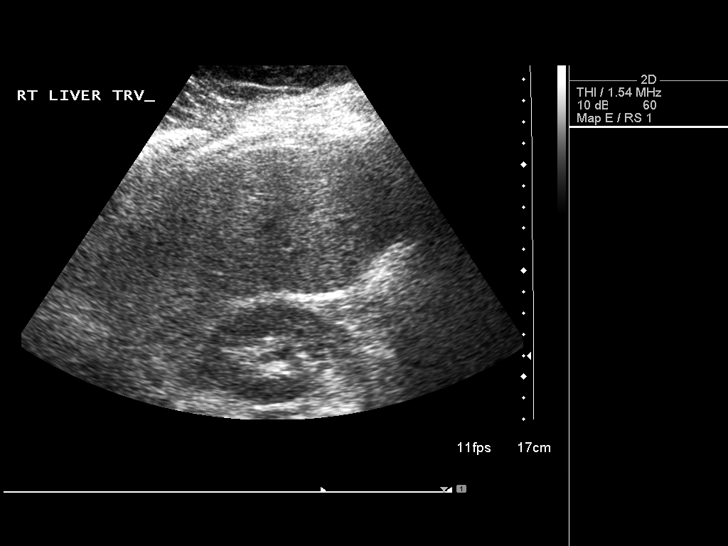
[im 30/79]
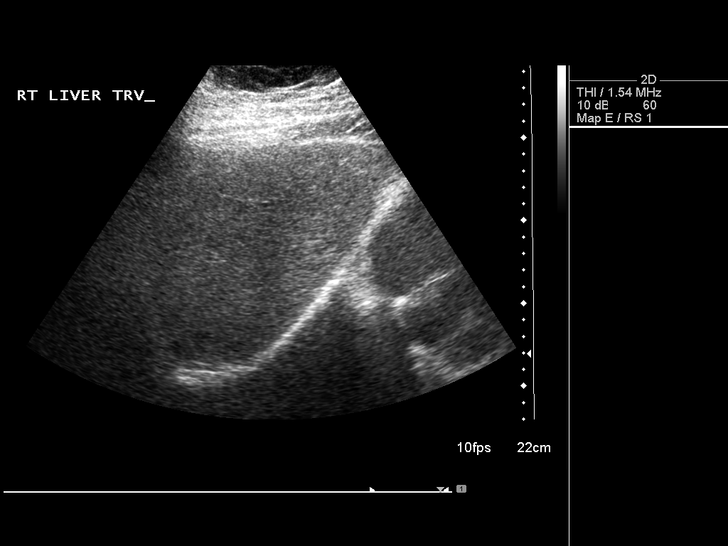
[im 36/79]
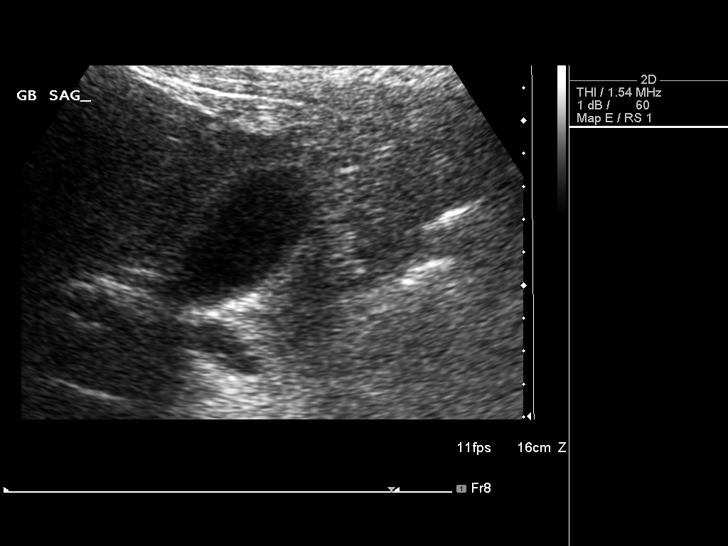
[im 43/79]
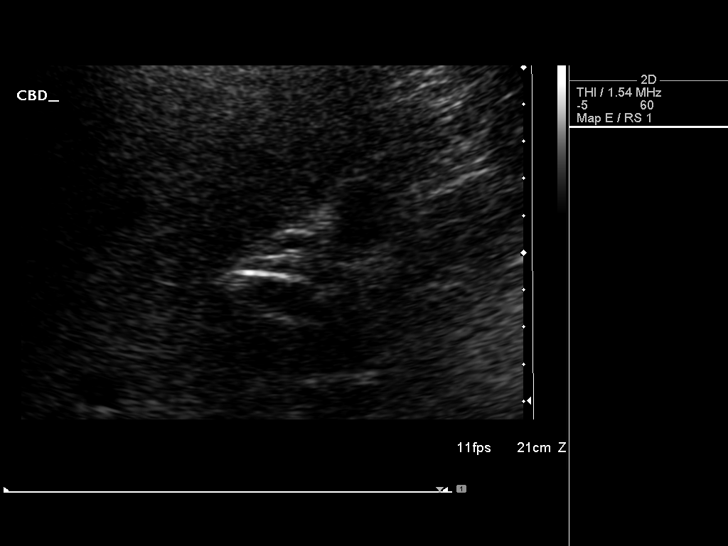
[im 49/79]
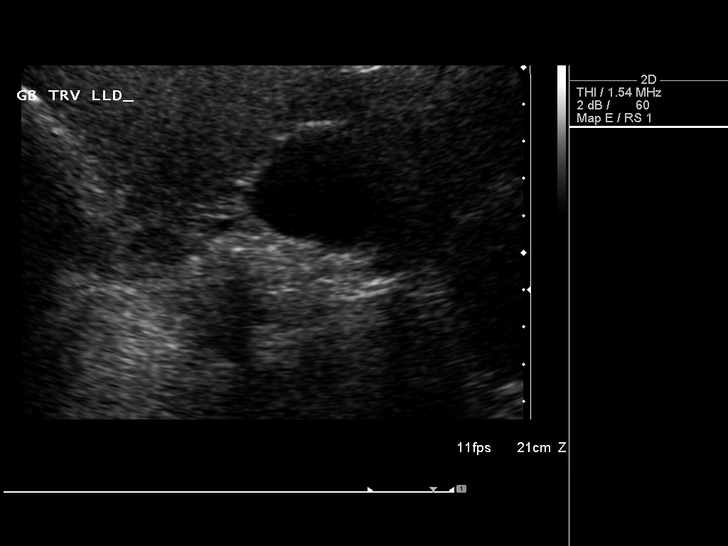
[im 53/79]
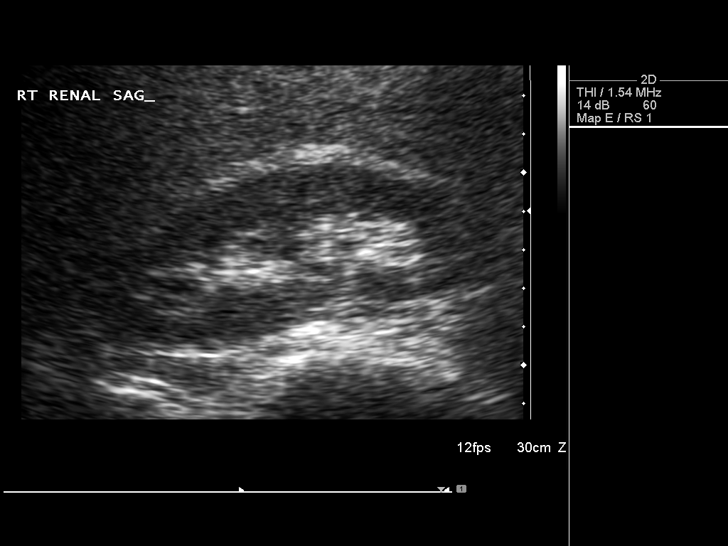
[im 59/79]
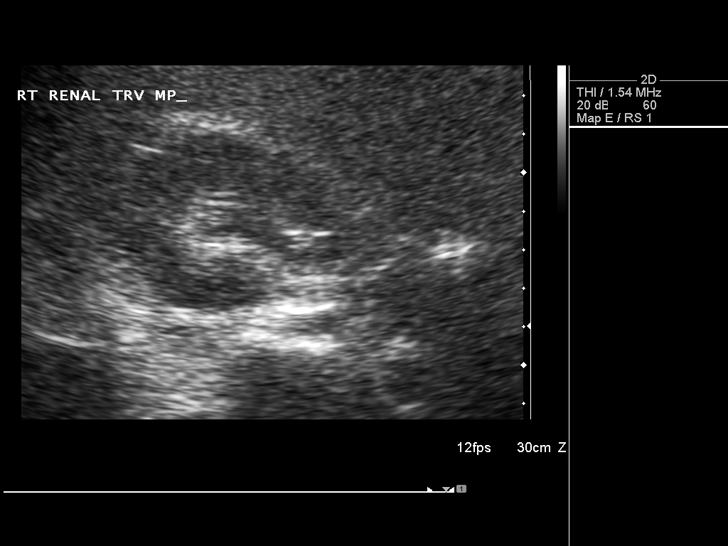
[im 66/79]
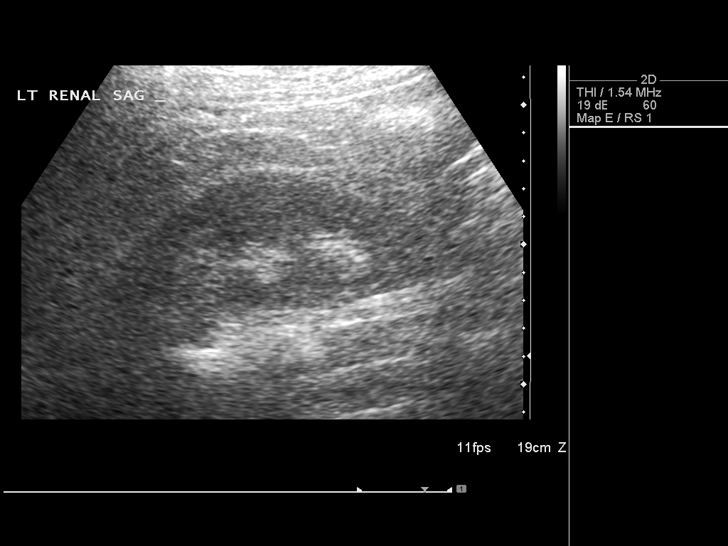
[im 72/79]
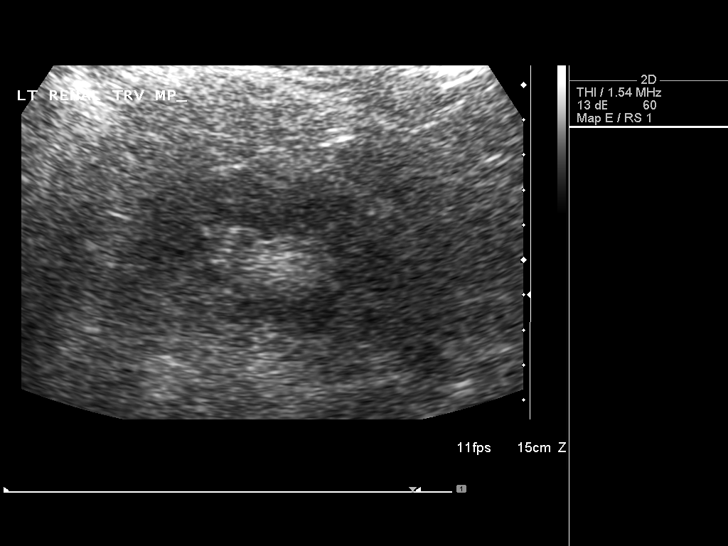
[im 79/79]
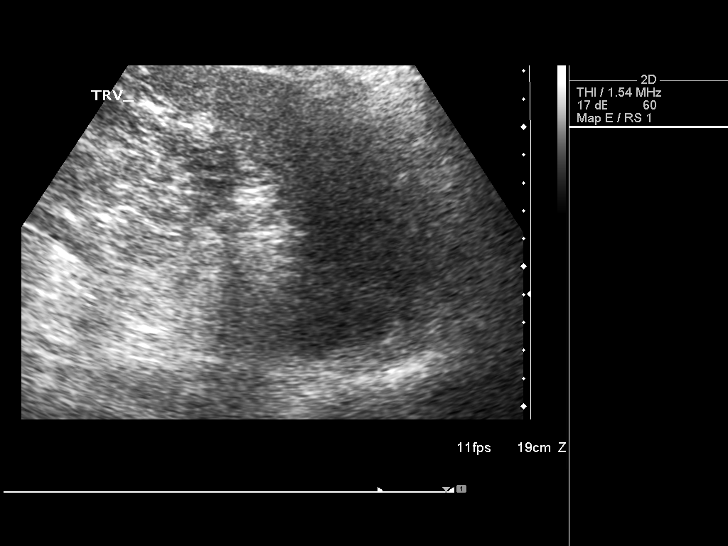

[14 of 25 positions shown; findings below may reference images not displayed]

FINDINGS: Gallbladder: No gallstones or wall thickening visualized. No
sonographic Murphy sign noted.

Common bile duct: Diameter: 4.4 mm

Liver: No focal lesion identified. Within normal limits in
parenchymal echogenicity.

IVC: No abnormality visualized.

Pancreas: Obscured by overlying bowel gas.

Spleen: Size and appearance within normal limits.

Right Kidney: Length: 11.5 cm. Echogenicity within normal limits. No
mass or hydronephrosis visualized.

Left Kidney: Length: 12 cm. Echogenicity within normal limits. No
mass or hydronephrosis visualized.

Abdominal aorta: No aneurysm visualized.

Other findings: None.
IMPRESSION: 1. No cholelithiasis or sonographic evidence of acute cholecystitis.

## 2016-05-30 ENCOUNTER — Ambulatory Visit: Payer: 59 | Admitting: Family Medicine

## 2016-06-13 ENCOUNTER — Other Ambulatory Visit: Payer: Self-pay | Admitting: Family Medicine

## 2016-06-13 ENCOUNTER — Other Ambulatory Visit: Payer: Self-pay

## 2016-06-13 MED ORDER — HYDROCHLOROTHIAZIDE 25 MG PO TABS: ORAL_TABLET | ORAL | Status: DC

## 2016-07-12 ENCOUNTER — Other Ambulatory Visit: Payer: Self-pay | Admitting: Family Medicine

## 2016-08-05 ENCOUNTER — Encounter: Payer: Self-pay | Admitting: Family Medicine

## 2016-08-05 ENCOUNTER — Ambulatory Visit (INDEPENDENT_AMBULATORY_CARE_PROVIDER_SITE_OTHER): Payer: 59 | Admitting: Family Medicine

## 2016-08-05 VITALS — BP 128/82 | HR 83 | Temp 98.4°F | Resp 17 | Ht 69.0 in | Wt 301.0 lb

## 2016-08-05 DIAGNOSIS — J069 Acute upper respiratory infection, unspecified: Secondary | ICD-10-CM

## 2016-08-05 DIAGNOSIS — J029 Acute pharyngitis, unspecified: Secondary | ICD-10-CM

## 2016-08-05 LAB — POCT CBC
GRANULOCYTE PERCENT: 67.4 % (ref 37–80)
HEMATOCRIT: 42.8 % (ref 37.7–47.9)
HEMOGLOBIN: 15.1 g/dL (ref 12.2–16.2)
Lymph, poc: 1.9 (ref 0.6–3.4)
MCH: 30.9 pg (ref 27–31.2)
MCHC: 35.2 g/dL (ref 31.8–35.4)
MCV: 87.6 fL (ref 80–97)
MID (cbc): 0.6 (ref 0–0.9)
MPV: 8.6 fL (ref 0–99.8)
POC GRANULOCYTE: 5.3 (ref 2–6.9)
POC LYMPH PERCENT: 24.5 %L (ref 10–50)
POC MID %: 8.1 %M (ref 0–12)
Platelet Count, POC: 304 10*3/uL (ref 142–424)
RBC: 4.88 M/uL (ref 4.04–5.48)
RDW, POC: 13.3 %
WBC: 7.8 10*3/uL (ref 4.6–10.2)

## 2016-08-05 LAB — POCT RAPID STREP A (OFFICE): Rapid Strep A Screen: NEGATIVE

## 2016-08-05 NOTE — Progress Notes (Signed)
Subjective:  By signing my name below, I, Stann Oresung-Kai Tsai, attest that this documentation has been prepared under the direction and in the presence of Meredith StaggersJeffrey Lela Gell, MD. Electronically Signed: Stann Oresung-Kai Tsai, Scribe. 08/05/2016 , 8:55 AM .  Patient was seen in Room 10 .   Patient ID: Cassandra Levy, female    DOB: [birth year], 35 y.o.   MRN: [id number] Chief Complaint  Patient presents with  . Sore Throat   HPI Cassandra Levy is a 35 y.o. female  Patient with h/o DM and obesity. Previous h/o lower extremity cellulitis with lower extremity edema.   Patient states that she's been taking claritin for the past few weeks due to allergy season. She woke up yesterday morning with burning sore throat and pain. She feels like there's something struck in her throat with difficulty to swallow. She initially thought it might be due to her thyroid due to history of hyperthyroidism. She noticed nasal congestion which was clear when blown out, cough and voice hoarseness started this morning, as well as sweats in bed. She denies any known fever.   She works with [employer]. She knows her boss's fiance has strep. She tried to drink more water and tolerate. She's also able to eat solid foods (boiled chicken) last night.   Patient Active Problem List   Diagnosis Date Noted  . Morbid obesity (HCC) 10/03/2015  . Hyperthyroidism 01/20/2014  . PCOS (polycystic ovarian syndrome) 12/13/2013  . Cellulitis of left lower extremity 12/03/2013  . Cellulitis 12/03/2013  . Lower extremity edema 12/03/2013  . Obesity 10/08/2012  . Goiter 06/27/2012  . Constipation 06/27/2012   Past Medical History:  Diagnosis Date  . Allergy   . Anemia   . Thyroid disease    Past Surgical History:  Procedure Laterality Date  . CHOLECYSTECTOMY    . COLPOSCOPY     Allergies  Allergen Reactions  . Amoxicillin Swelling    Tightness in throat  . Cleocin [Clindamycin Hcl]     hives   Prior to  Admission medications   Medication Sig Start Date End Date Taking? Authorizing Provider  hydrochlorothiazide (HYDRODIURIL) 25 MG tablet Take 1/2 tab up to 3 times a day depending upon your symptoms. 06/13/16  Yes Ethelda ChickKristi M Smith, MD  metFORMIN (GLUCOPHAGE) 500 MG tablet Take 500 mg by mouth 2 (two) times daily with a meal.    Yes Historical Provider, MD  norethindrone (AYGESTIN) 5 MG tablet Take 10 mg by mouth daily.    Yes Historical Provider, MD  EPINEPHrine (EPIPEN) 0.3 mg/0.3 mL SOAJ injection Inject 0.3 mLs (0.3 mg total) into the muscle once. Patient not taking: Reported on 08/05/2016 12/09/13   Pearline CablesJessica C Copland, MD   Social History   Social History  . Marital status: Married    Spouse name: N/A  . Number of children: N/A  . Years of education: N/A   Occupational History  . Not on file.   Social History Main Topics  . Smoking status: Never Smoker  . Smokeless tobacco: Not on file  . Alcohol use No  . Drug use: No  . Sexual activity: Not on file   Other Topics Concern  . Not on file   Social History Narrative  . No narrative on file   Review of Systems  Constitutional: Positive for chills, diaphoresis and fatigue. Negative for appetite change and fever.  HENT: Positive for congestion, rhinorrhea, sore throat, trouble swallowing and voice change.   Respiratory: Positive for  cough. Negative for shortness of breath and wheezing.        Objective:   Physical Exam  Constitutional: She is oriented to person, place, and time. She appears well-developed and well-nourished. No distress.  HENT:  Head: Normocephalic and atraumatic.  Right Ear: Hearing, tympanic membrane, external ear and ear canal normal.  Left Ear: Hearing, tympanic membrane, external ear and ear canal normal.  Nose: Nose normal.  Mouth/Throat: Posterior oropharyngeal erythema present. No oropharyngeal exudate or tonsillar abscesses.  Eyes: Conjunctivae and EOM are normal. Pupils are equal, round, and  reactive to light.  Neck: Neck supple.  Cardiovascular: Normal rate, regular rhythm, normal heart sounds and intact distal pulses.   No murmur heard. Pulmonary/Chest: Effort normal and breath sounds normal. No stridor. No respiratory distress. She has no wheezes. She has no rhonchi.  Musculoskeletal: Normal range of motion.  Lymphadenopathy:  Slightly tender and enlarged right AC node  Neurological: She is alert and oriented to person, place, and time.  Skin: Skin is warm and dry. No rash noted.  Psychiatric: She has a normal mood and affect. Her behavior is normal.  Nursing note and vitals reviewed.   Vitals:   08/05/16 0818  BP: 128/82  Pulse: 83  Resp: 17  Temp: 98.4 F (36.9 C)  TempSrc: Oral  SpO2: 98%  Weight: (!) 301 lb (136.5 kg)  Height: 5\' 9"  (1.753 m)   Results for orders placed or performed in visit on 08/05/16  POCT rapid strep A  Result Value Ref Range   Rapid Strep A Screen Negative Negative  POCT CBC  Result Value Ref Range   WBC 7.8 4.6 - 10.2 K/uL   Lymph, poc 1.9 0.6 - 3.4   POC LYMPH PERCENT 24.5 10 - 50 %L   MID (cbc) 0.6 0 - 0.9   POC MID % 8.1 0 - 12 %M   POC Granulocyte 5.3 2 - 6.9   Granulocyte percent 67.4 37 - 80 %G   RBC 4.88 4.04 - 5.48 M/uL   Hemoglobin 15.1 12.2 - 16.2 g/dL   HCT, POC 16.142.8 09.637.7 - 47.9 %   MCV 87.6 80 - 97 fL   MCH, POC 30.9 27 - 31.2 pg   MCHC 35.2 31.8 - 35.4 g/dL   RDW, POC 04.513.3 %   Platelet Count, POC 304 142 - 424 K/uL   MPV 8.6 0 - 99.8 fL      Assessment & Plan:     Cassandra Levy is a 35 y.o. female Sore throat - Plan: POCT rapid strep A, Culture, Group A Strep, POCT CBC  Acute upper respiratory infection  reassuring CBC. Suspected viral infection. Symptomatic care discussed, RTC precautions. Strep throat culture ordered.   No orders of the defined types were placed in this encounter.  Patient Instructions   Your blood count appeared reassuring. The strep test was negative, but I will also send  a strep throat culture to make sure. This appears to be due to a virus, so symptomatic care with over-the-counter medication is initial approach. You can use medicines such as Cepacol cough drops, Tylenol or Motrin as needed, fluids, and rest. If any worsening of symptoms as we discussed, return here or the emergency room.   Return to the clinic or go to the nearest emergency room if any of your symptoms worsen or new symptoms occur.    Sore Throat A sore throat is pain, burning, irritation, or scratchiness of the throat. There is often pain  or tenderness when swallowing or talking. A sore throat may be accompanied by other symptoms, such as coughing, sneezing, fever, and swollen neck glands. A sore throat is often the first sign of another sickness, such as a cold, flu, strep throat, or mononucleosis (commonly known as mono). Most sore throats go away without medical treatment. CAUSES  The most common causes of a sore throat include:  A viral infection, such as a cold, flu, or mono.  A bacterial infection, such as strep throat, tonsillitis, or whooping cough.  Seasonal allergies.  Dryness in the air.  Irritants, such as smoke or pollution.  Gastroesophageal reflux disease (GERD). HOME CARE INSTRUCTIONS   Only take over-the-counter medicines as directed by your caregiver.  Drink enough fluids to keep your urine clear or pale yellow.  Rest as needed.  Try using throat sprays, lozenges, or sucking on hard candy to ease any pain (if older than 4 years or as directed).  Sip warm liquids, such as broth, herbal tea, or warm water with honey to relieve pain temporarily. You may also eat or drink cold or frozen liquids such as frozen ice pops.  Gargle with salt water (mix 1 tsp salt with 8 oz of water).  Do not smoke and avoid secondhand smoke.  Put a cool-mist humidifier in your bedroom at night to moisten the air. You can also turn on a hot shower and sit in the bathroom with the door  closed for 5-10 minutes. SEEK IMMEDIATE MEDICAL CARE IF:  You have difficulty breathing.  You are unable to swallow fluids, soft foods, or your saliva.  You have increased swelling in the throat.  Your sore throat does not get better in 7 days.  You have nausea and vomiting.  You have a fever or persistent symptoms for more than 2-3 days.  You have a fever and your symptoms suddenly get worse. MAKE SURE YOU:   Understand these instructions.  Will watch your condition.  Will get help right away if you are not doing well or get worse.   This information is not intended to replace advice given to you by your health care provider. Make sure you discuss any questions you have with your health care provider.   Document Released: 01/09/2005 Document Revised: 12/23/2014 Document Reviewed: 08/09/2012 Elsevier Interactive Patient Education 2016 Elsevier Inc.   Upper Respiratory Infection, Adult Most upper respiratory infections (URIs) are a viral infection of the air passages leading to the lungs. A URI affects the nose, throat, and upper air passages. The most common type of URI is nasopharyngitis and is typically referred to as "the common cold." URIs run their course and usually go away on their own. Most of the time, a URI does not require medical attention, but sometimes a bacterial infection in the upper airways can follow a viral infection. This is called a secondary infection. Sinus and middle ear infections are common types of secondary upper respiratory infections. Bacterial pneumonia can also complicate a URI. A URI can worsen asthma and chronic obstructive pulmonary disease (COPD). Sometimes, these complications can require emergency medical care and may be life threatening.  CAUSES Almost all URIs are caused by viruses. A virus is a type of germ and can spread from one person to another.  RISKS FACTORS You may be at risk for a URI if:   You smoke.   You have chronic  heart or lung disease.  You have a weakened defense (immune) system.   You are very  young or very old.   You have nasal allergies or asthma.  You work in crowded or poorly ventilated areas.  You work in health care facilities or schools. SIGNS AND SYMPTOMS  Symptoms typically develop 2-3 days after you come in contact with a cold virus. Most viral URIs last 7-10 days. However, viral URIs from the influenza virus (flu virus) can last 14-18 days and are typically more severe. Symptoms may include:   Runny or stuffy (congested) nose.   Sneezing.   Cough.   Sore throat.   Headache.   Fatigue.   Fever.   Loss of appetite.   Pain in your forehead, behind your eyes, and over your cheekbones (sinus pain).  Muscle aches.  DIAGNOSIS  Your health care provider may diagnose a URI by:  Physical exam.  Tests to check that your symptoms are not due to another condition such as:  Strep throat.  Sinusitis.  Pneumonia.  Asthma. TREATMENT  A URI goes away on its own with time. It cannot be cured with medicines, but medicines may be prescribed or recommended to relieve symptoms. Medicines may help:  Reduce your fever.  Reduce your cough.  Relieve nasal congestion. HOME CARE INSTRUCTIONS   Take medicines only as directed by your health care provider.   Gargle warm saltwater or take cough drops to comfort your throat as directed by your health care provider.  Use a warm mist humidifier or inhale steam from a shower to increase air moisture. This may make it easier to breathe.  Drink enough fluid to keep your urine clear or pale yellow.   Eat soups and other clear broths and maintain good nutrition.   Rest as needed.   Return to work when your temperature has returned to normal or as your health care provider advises. You may need to stay home longer to avoid infecting others. You can also use a face mask and careful hand washing to prevent spread of the  virus.  Increase the usage of your inhaler if you have asthma.   Do not use any tobacco products, including cigarettes, chewing tobacco, or electronic cigarettes. If you need help quitting, ask your health care provider. PREVENTION  The best way to protect yourself from getting a cold is to practice good hygiene.   Avoid oral or hand contact with people with cold symptoms.   Wash your hands often if contact occurs.  There is no clear evidence that vitamin C, vitamin E, echinacea, or exercise reduces the chance of developing a cold. However, it is always recommended to get plenty of rest, exercise, and practice good nutrition.  SEEK MEDICAL CARE IF:   You are getting worse rather than better.   Your symptoms are not controlled by medicine.   You have chills.  You have worsening shortness of breath.  You have brown or red mucus.  You have yellow or brown nasal discharge.  You have pain in your face, especially when you bend forward.  You have a fever.  You have swollen neck glands.  You have pain while swallowing.  You have white areas in the back of your throat. SEEK IMMEDIATE MEDICAL CARE IF:   You have severe or persistent:  Headache.  Ear pain.  Sinus pain.  Chest pain.  You have chronic lung disease and any of the following:  Wheezing.  Prolonged cough.  Coughing up blood.  A change in your usual mucus.  You have a stiff neck.  You have changes  in your:  Vision.  Hearing.  Thinking.  Mood. MAKE SURE YOU:   Understand these instructions.  Will watch your condition.  Will get help right away if you are not doing well or get worse.   This information is not intended to replace advice given to you by your health care provider. Make sure you discuss any questions you have with your health care provider.   Document Released: 05/28/2001 Document Revised: 04/18/2015 Document Reviewed: 03/09/2014 Elsevier Interactive Patient Education  2016 [employer].   IF you received an x-ray today, you will receive an invoice from [facility]. Please contact [facility] at [phone] with questions or concerns regarding your invoice.   IF you received labwork today, you will receive an invoice from [employer]. Please contact [facility] at [phone] with questions or concerns regarding your invoice.   Our billing staff will not be able to assist you with questions regarding bills from these companies.  You will be contacted with the lab results as soon as they are available. The fastest way to get your results is to activate your My Chart account. Instructions are located on the last page of this paperwork. If you have not heard from [place] regarding the results in 2 weeks, please contact this office.        I personally performed the services described in this documentation, which was scribed in my presence. The recorded information has been reviewed and considered, and addended by me as needed.   Signed,   Meredith Staggers, MD [facility].  08/07/16 11:56 AM

## 2016-08-05 NOTE — Patient Instructions (Addendum)
Your blood count appeared reassuring. The strep test was negative, but I will also send a strep throat culture to make sure. This appears to be due to a virus, so symptomatic care with over-the-counter medication is initial approach. You can use medicines such as Cepacol cough drops, Tylenol or Motrin as needed, fluids, and rest. If any worsening of symptoms as we discussed, return here or the emergency room.   Return to the clinic or go to the nearest emergency room if any of your symptoms worsen or new symptoms occur.    Sore Throat A sore throat is pain, burning, irritation, or scratchiness of the throat. There is often pain or tenderness when swallowing or talking. A sore throat may be accompanied by other symptoms, such as coughing, sneezing, fever, and swollen neck glands. A sore throat is often the first sign of another sickness, such as a cold, flu, strep throat, or mononucleosis (commonly known as mono). Most sore throats go away without medical treatment. CAUSES  The most common causes of a sore throat include:  A viral infection, such as a cold, flu, or mono.  A bacterial infection, such as strep throat, tonsillitis, or whooping cough.  Seasonal allergies.  Dryness in the air.  Irritants, such as smoke or pollution.  Gastroesophageal reflux disease (GERD). HOME CARE INSTRUCTIONS   Only take over-the-counter medicines as directed by your caregiver.  Drink enough fluids to keep your urine clear or pale yellow.  Rest as needed.  Try using throat sprays, lozenges, or sucking on hard candy to ease any pain (if older than 4 years or as directed).  Sip warm liquids, such as broth, herbal tea, or warm water with honey to relieve pain temporarily. You may also eat or drink cold or frozen liquids such as frozen ice pops.  Gargle with salt water (mix 1 tsp salt with 8 oz of water).  Do not smoke and avoid secondhand smoke.  Put a cool-mist humidifier in your bedroom at night to  moisten the air. You can also turn on a hot shower and sit in the bathroom with the door closed for 5-10 minutes. SEEK IMMEDIATE MEDICAL CARE IF:  You have difficulty breathing.  You are unable to swallow fluids, soft foods, or your saliva.  You have increased swelling in the throat.  Your sore throat does not get better in 7 days.  You have nausea and vomiting.  You have a fever or persistent symptoms for more than 2-3 days.  You have a fever and your symptoms suddenly get worse. MAKE SURE YOU:   Understand these instructions.  Will watch your condition.  Will get help right away if you are not doing well or get worse.   This information is not intended to replace advice given to you by your health care provider. Make sure you discuss any questions you have with your health care provider.   Document Released: 01/09/2005 Document Revised: 12/23/2014 Document Reviewed: 08/09/2012 Elsevier Interactive Patient Education 2016 Elsevier Inc.   Upper Respiratory Infection, Adult Most upper respiratory infections (URIs) are a viral infection of the air passages leading to the lungs. A URI affects the nose, throat, and upper air passages. The most common type of URI is nasopharyngitis and is typically referred to as "the common cold." URIs run their course and usually go away on their own. Most of the time, a URI does not require medical attention, but sometimes a bacterial infection in the upper airways can follow a viral  infection. This is called a secondary infection. Sinus and middle ear infections are common types of secondary upper respiratory infections. Bacterial pneumonia can also complicate a URI. A URI can worsen asthma and chronic obstructive pulmonary disease (COPD). Sometimes, these complications can require emergency medical care and may be life threatening.  CAUSES Almost all URIs are caused by viruses. A virus is a type of germ and can spread from one person to another.   RISKS FACTORS You may be at risk for a URI if:   You smoke.   You have chronic heart or lung disease.  You have a weakened defense (immune) system.   You are very young or very old.   You have nasal allergies or asthma.  You work in crowded or poorly ventilated areas.  You work in health care facilities or schools. SIGNS AND SYMPTOMS  Symptoms typically develop 2-3 days after you come in contact with a cold virus. Most viral URIs last 7-10 days. However, viral URIs from the influenza virus (flu virus) can last 14-18 days and are typically more severe. Symptoms may include:   Runny or stuffy (congested) nose.   Sneezing.   Cough.   Sore throat.   Headache.   Fatigue.   Fever.   Loss of appetite.   Pain in your forehead, behind your eyes, and over your cheekbones (sinus pain).  Muscle aches.  DIAGNOSIS  Your health care provider may diagnose a URI by:  Physical exam.  Tests to check that your symptoms are not due to another condition such as:  Strep throat.  Sinusitis.  Pneumonia.  Asthma. TREATMENT  A URI goes away on its own with time. It cannot be cured with medicines, but medicines may be prescribed or recommended to relieve symptoms. Medicines may help:  Reduce your fever.  Reduce your cough.  Relieve nasal congestion. HOME CARE INSTRUCTIONS   Take medicines only as directed by your health care provider.   Gargle warm saltwater or take cough drops to comfort your throat as directed by your health care provider.  Use a warm mist humidifier or inhale steam from a shower to increase air moisture. This may make it easier to breathe.  Drink enough fluid to keep your urine clear or pale yellow.   Eat soups and other clear broths and maintain good nutrition.   Rest as needed.   Return to work when your temperature has returned to normal or as your health care provider advises. You may need to stay home longer to avoid infecting  others. You can also use a face mask and careful hand washing to prevent spread of the virus.  Increase the usage of your inhaler if you have asthma.   Do not use any tobacco products, including cigarettes, chewing tobacco, or electronic cigarettes. If you need help quitting, ask your health care provider. PREVENTION  The best way to protect yourself from getting a cold is to practice good hygiene.   Avoid oral or hand contact with people with cold symptoms.   Wash your hands often if contact occurs.  There is no clear evidence that vitamin C, vitamin E, echinacea, or exercise reduces the chance of developing a cold. However, it is always recommended to get plenty of rest, exercise, and practice good nutrition.  SEEK MEDICAL CARE IF:   You are getting worse rather than better.   Your symptoms are not controlled by medicine.   You have chills.  You have worsening shortness of breath.  You have brown or red mucus.  You have yellow or brown nasal discharge.  You have pain in your face, especially when you bend forward.  You have a fever.  You have swollen neck glands.  You have pain while swallowing.  You have white areas in the back of your throat. SEEK IMMEDIATE MEDICAL CARE IF:   You have severe or persistent:  Headache.  Ear pain.  Sinus pain.  Chest pain.  You have chronic lung disease and any of the following:  Wheezing.  Prolonged cough.  Coughing up blood.  A change in your usual mucus.  You have a stiff neck.  You have changes in your:  Vision.  Hearing.  Thinking.  Mood. MAKE SURE YOU:   Understand these instructions.  Will watch your condition.  Will get help right away if you are not doing well or get worse.   This information is not intended to replace advice given to you by your health care provider. Make sure you discuss any questions you have with your health care provider.   Document Released: 05/28/2001 Document  Revised: 04/18/2015 Document Reviewed: 03/09/2014 Elsevier Interactive Patient Education 2016 [employer].   IF you received an x-ray today, you will receive an invoice from [facility]. Please contact [facility] at [phone] with questions or concerns regarding your invoice.   IF you received labwork today, you will receive an invoice from [employer]. Please contact [facility] at [phone] with questions or concerns regarding your invoice.   Our billing staff will not be able to assist you with questions regarding bills from these companies.  You will be contacted with the lab results as soon as they are available. The fastest way to get your results is to activate your My Chart account. Instructions are located on the last page of this paperwork. If you have not heard from [place] regarding the results in 2 weeks, please contact this office.

## 2016-08-07 LAB — CULTURE, GROUP A STREP: ORGANISM ID, BACTERIA: NORMAL

## 2016-10-04 ENCOUNTER — Other Ambulatory Visit: Payer: Self-pay | Admitting: Family Medicine

## 2016-10-08 ENCOUNTER — Ambulatory Visit (INDEPENDENT_AMBULATORY_CARE_PROVIDER_SITE_OTHER): Payer: 59 | Admitting: Physician Assistant

## 2016-10-08 ENCOUNTER — Encounter: Payer: Self-pay | Admitting: Physician Assistant

## 2016-10-08 VITALS — BP 122/82 | HR 78 | Temp 98.2°F | Resp 17 | Ht 69.0 in | Wt 294.0 lb

## 2016-10-08 DIAGNOSIS — R600 Localized edema: Secondary | ICD-10-CM

## 2016-10-08 DIAGNOSIS — S39012A Strain of muscle, fascia and tendon of lower back, initial encounter: Secondary | ICD-10-CM

## 2016-10-08 LAB — COMPLETE METABOLIC PANEL WITH GFR
ALT: 14 U/L (ref 6–29)
AST: 12 U/L (ref 10–30)
Albumin: 3.9 g/dL (ref 3.6–5.1)
Alkaline Phosphatase: 77 U/L (ref 33–115)
BILIRUBIN TOTAL: 0.4 mg/dL (ref 0.2–1.2)
BUN: 16 mg/dL (ref 7–25)
CALCIUM: 9.1 mg/dL (ref 8.6–10.2)
CO2: 25 mmol/L (ref 20–31)
Chloride: 103 mmol/L (ref 98–110)
Creat: 0.85 mg/dL (ref 0.50–1.10)
GFR, Est African American: >89 mL/min (ref >=60)
GFR, Est Non African American: 89 mL/min (ref >=60)
Glucose, Bld: 76 mg/dL (ref 65–99)
POTASSIUM: 4.1 mmol/L (ref 3.5–5.3)
Sodium: 139 mmol/L (ref 135–146)
TOTAL PROTEIN: 7 g/dL (ref 6.1–8.1)

## 2016-10-08 MED ORDER — IBUPROFEN 800 MG PO TABS: 800.0000 mg | ORAL_TABLET | Freq: Three times a day (TID) | ORAL | 0 refills | Status: DC | PRN

## 2016-10-08 MED ORDER — CYCLOBENZAPRINE HCL 5 MG PO TABS: 5.0000 mg | ORAL_TABLET | Freq: Three times a day (TID) | ORAL | 0 refills | Status: DC | PRN

## 2016-10-08 MED ORDER — HYDROCHLOROTHIAZIDE 25 MG PO TABS: 25.0000 mg | ORAL_TABLET | Freq: Every day | ORAL | 1 refills | Status: DC

## 2016-10-08 NOTE — Patient Instructions (Addendum)
Back Pain, Adult Back pain is very common in adults.The cause of back pain is rarely dangerous and the pain often gets better over time.The cause of your back pain may not be known. Some common causes of back pain include:  Strain of the muscles or ligaments supporting the spine.  Wear and tear (degeneration) of the spinal disks.  Arthritis.  Direct injury to the back. For many people, back pain may return. Since back pain is rarely dangerous, most people can learn to manage this condition on their own. HOME CARE INSTRUCTIONS Watch your back pain for any changes. The following actions may help to lessen any discomfort you are feeling:  Remain active. It is stressful on your back to sit or stand in one place for long periods of time. Do not sit, drive, or stand in one place for more than 30 minutes at a time. Take short walks on even surfaces as soon as you are able.Try to increase the length of time you walk each day.  Exercise regularly as directed by your health care provider. Exercise helps your back heal faster. It also helps avoid future injury by keeping your muscles strong and flexible.  Do not stay in bed.Resting more than 1-2 days can delay your recovery.  Pay attention to your body when you bend and lift. The most comfortable positions are those that put less stress on your recovering back. Always use proper lifting techniques, including:  Bending your knees.  Keeping the load close to your body.  Avoiding twisting.  Find a comfortable position to sleep. Use a firm mattress and lie on your side with your knees slightly bent. If you lie on your back, put a pillow under your knees.  Avoid feeling anxious or stressed.Stress increases muscle tension and can worsen back pain.It is important to recognize when you are anxious or stressed and learn ways to manage it, such as with exercise.  Take medicines only as directed by your health care provider. Over-the-counter  medicines to reduce pain and inflammation are often the most helpful.Your health care provider may prescribe muscle relaxant drugs.These medicines help dull your pain so you can more quickly return to your normal activities and healthy exercise.  Apply ice to the injured area:  Put ice in a plastic bag.  Place a towel between your skin and the bag.  Leave the ice on for 20 minutes, 2-3 times a day for the first 2-3 days. After that, ice and heat may be alternated to reduce pain and spasms.  Maintain a healthy weight. Excess weight puts extra stress on your back and makes it difficult to maintain good posture. SEEK MEDICAL CARE IF:  You have pain that is not relieved with rest or medicine.  You have increasing pain going down into the legs or buttocks.  You have pain that does not improve in one week.  You have night pain.  You lose weight.  You have a fever or chills. SEEK IMMEDIATE MEDICAL CARE IF:   You develop new bowel or bladder control problems.  You have unusual weakness or numbness in your arms or legs.  You develop nausea or vomiting.  You develop abdominal pain.  You feel faint.   This information is not intended to replace advice given to you by your health care provider. Make sure you discuss any questions you have with your health care provider.   Document Released: 12/02/2005 Document Revised: 12/23/2014 Document Reviewed: 04/05/2014 Elsevier Interactive Patient Education 2016 Elsevier   Inc.     IF you received an x-ray today, you will receive an invoice from [facility]. Please contact [facility] at [phone] with questions or concerns regarding your invoice.   IF you received labwork today, you will receive an invoice from [facility]. Please contact [facility] at [phone] with questions or concerns regarding your invoice.   Our billing staff will not be able to assist you with questions regarding  bills from these companies.  You will be contacted with the lab results as soon as they are available. The fastest way to get your results is to activate your My Chart account. Instructions are located on the last page of this paperwork. If you have not heard from us regarding the results in 2 weeks, please contact this office.      

## 2016-10-08 NOTE — Progress Notes (Signed)
Cassandra CopaJessica L Levy  MRN: [id number] DOB: [birth year]  Subjective:  Pt presents to clinic with low back pain for the last couple of week.  She went to bed and woke up with pain and she thought she has slept incorrectly and she was careful for about a week.  She used motrin and aspercream and she went back to normal.  Then today she got out of the car and has similar pain but it worse today.  No pain radiation into legs.  No bowel/bladder problems.   Pt would also like a refill of her HCTZ that she uses for her leg swelling.    Review of Systems  Musculoskeletal: Positive for back pain.    Patient Active Problem List   Diagnosis Date Noted  . Morbid obesity (HCC) 10/03/2015  . Hyperthyroidism 01/20/2014  . PCOS (polycystic ovarian syndrome) 12/13/2013  . Cellulitis of left lower extremity 12/03/2013  . Cellulitis 12/03/2013  . Lower extremity edema 12/03/2013  . Obesity 10/08/2012  . Goiter 06/27/2012  . Constipation 06/27/2012    Current Outpatient Prescriptions on File Prior to Visit  Medication Sig Dispense Refill  . EPINEPHrine (EPIPEN) 0.3 mg/0.3 mL SOAJ injection Inject 0.3 mLs (0.3 mg total) into the muscle once. 2 Device 2  . metFORMIN (GLUCOPHAGE) 500 MG tablet Take 500 mg by mouth 2 (two) times daily with a meal.     . norethindrone (AYGESTIN) 5 MG tablet Take 10 mg by mouth daily.      No current facility-administered medications on file prior to visit.     Allergies  Allergen Reactions  . Amoxicillin Swelling    Tightness in throat  . Cleocin [Clindamycin Hcl]     hives    Pt patients past, family and social history were reviewed and updated.  Objective:  BP 122/82 (BP Location: Right Arm, Patient Position: Sitting, Cuff Size: Large)   Pulse 78   Temp 98.2 F (36.8 C) (Oral)   Resp 17   Ht 5\' 9"  (1.753 m)   Wt 294 lb (133.4 kg)   LMP 09/27/2016   SpO2 99%   BMI 43.42 kg/m   Physical Exam  Constitutional: She is oriented to person, place, and time  and well-developed, well-nourished, and in no distress.  HENT:  Head: Normocephalic and atraumatic.  Right Ear: Hearing and external ear normal.  Left Ear: Hearing and external ear normal.  Eyes: Conjunctivae are normal.  Neck: Normal range of motion.  Pulmonary/Chest: Effort normal.  Musculoskeletal:       Lumbar back: She exhibits decreased range of motion (lacks full extension - pt states feeds good to felxs as it feels like that is stretching her back ) and tenderness. She exhibits no spasm.       Back:  Neurological: She is alert and oriented to person, place, and time. She has normal sensation, normal strength and normal reflexes. She displays no weakness and normal reflexes. She has a normal Straight Leg Raise Test. Gait normal. Gait normal.  Skin: Skin is warm and dry.  Psychiatric: Mood, memory, affect and judgment normal.  Vitals reviewed.   Assessment and Plan :  Lower extremity edema - Plan: hydrochlorothiazide (HYDRODIURIL) 25 MG tablet, COMPLETE METABOLIC PANEL WITH GFR  Strain of lumbar region, initial encounter - Plan: cyclobenzaprine (FLEXERIL) 5 MG tablet, ibuprofen (ADVIL,MOTRIN) 800 MG tablet   Education regarding low back strain given to patient - ice for 48h and then heat to the muscles.  She should stretch to help  improve her symptoms.  Benny Lennert PA-C  [facility] 10/08/2016 11:49 AM

## 2016-11-25 ENCOUNTER — Encounter: Payer: Self-pay | Admitting: Family Medicine

## 2016-11-25 ENCOUNTER — Ambulatory Visit (INDEPENDENT_AMBULATORY_CARE_PROVIDER_SITE_OTHER): Payer: 59 | Admitting: Family Medicine

## 2016-11-25 VITALS — BP 134/74 | HR 88 | Temp 98.1°F | Ht 68.0 in | Wt 299.8 lb

## 2016-11-25 DIAGNOSIS — E282 Polycystic ovarian syndrome: Secondary | ICD-10-CM

## 2016-11-25 DIAGNOSIS — Z13220 Encounter for screening for lipoid disorders: Secondary | ICD-10-CM

## 2016-11-25 DIAGNOSIS — Z131 Encounter for screening for diabetes mellitus: Secondary | ICD-10-CM

## 2016-11-25 DIAGNOSIS — R600 Localized edema: Secondary | ICD-10-CM | POA: Diagnosis not present

## 2016-11-25 DIAGNOSIS — Z8639 Personal history of other endocrine, nutritional and metabolic disease: Secondary | ICD-10-CM | POA: Diagnosis not present

## 2016-11-25 LAB — HEMOGLOBIN A1C: HEMOGLOBIN A1C: 5.2 % (ref 4.6–6.5)

## 2016-11-25 LAB — LIPID PANEL
CHOL/HDL RATIO: 5
Cholesterol: 192 mg/dL (ref 0–200)
HDL: 35.3 mg/dL — ABNORMAL LOW (ref >39.00)
LDL CALC: 143 mg/dL — AB (ref 0–99)
NONHDL: 156.34
Triglycerides: 65 mg/dL (ref 0.0–149.0)
VLDL: 13 mg/dL (ref 0.0–40.0)

## 2016-11-25 LAB — TSH: TSH: 3.6 u[IU]/mL (ref 0.35–4.50)

## 2016-11-25 MED ORDER — HYDROCHLOROTHIAZIDE 25 MG PO TABS: 25.0000 mg | ORAL_TABLET | Freq: Every day | ORAL | 3 refills | Status: DC

## 2016-11-25 MED ORDER — HYDROCHLOROTHIAZIDE 25 MG PO TABS: 25.0000 mg | ORAL_TABLET | Freq: Every day | ORAL | 1 refills | Status: DC

## 2016-11-25 NOTE — Progress Notes (Signed)
Pre visit review using our clinic review tool, if applicable. No additional management support is needed unless otherwise documented below in the visit note. 

## 2016-11-25 NOTE — Progress Notes (Signed)
[facility] [address], [address] [place], [place] [address] [phone] [facility]-[phone] [phone]  Date:  11/25/2016   Name:  Cassandra CopaJessica L Komar   DOB:  1981/01/06   MRN:  [id number]  PCP:  Abbe AmsterdamOPLAND,Keshonna, MD    Chief Complaint: Establish Care (Pt here to Re-establish care. Last seen in 2015. )   History of Present Illness:  Cassandra Levy is a 35 y.o. very pleasant female patient who presents with the following:  History of obesity, PCOS, LE edema  She is [facility]- she is helping her with her PCOS  Sh has lost some weight!  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 History of hyperthyroidism that resolved w/o treatment She just had a shake this am- would like to go ahead and do labs  She declines a flu shot today   Patient Active Problem List   Diagnosis Date Noted  . Morbid obesity (HCC) 10/03/2015  . Hyperthyroidism 01/20/2014  . PCOS (polycystic ovarian syndrome) 12/13/2013  . Cellulitis of left lower extremity 12/03/2013  . Cellulitis 12/03/2013  . Lower extremity edema 12/03/2013  . Obesity 10/08/2012  . Goiter 06/27/2012  . Constipation 06/27/2012    Past Medical History:  Diagnosis Date  . Allergy   . Anemia   . Thyroid disease     Past Surgical History:  Procedure Laterality Date  . CHOLECYSTECTOMY    .  COLPOSCOPY      Social History  Substance Use Topics  . Smoking status: Never Smoker  . Smokeless tobacco: Not on file  . Alcohol use No    Family History  Problem Relation Age of Onset  . Stroke Mother   . Leukemia Other     Allergies  Allergen Reactions  . Amoxicillin Swelling    Tightness in throat  . Cleocin [Clindamycin Hcl]     hives    Medication list has been reviewed and updated.  Current Outpatient Prescriptions on File Prior to Visit  Medication Sig Dispense Refill  . EPINEPHrine (EPIPEN) 0.3 mg/0.3 mL SOAJ injection Inject 0.3 mLs (0.3 mg total) into the muscle once. 2 Device 2  . metFORMIN (GLUCOPHAGE) 500 MG tablet Take 500 mg by mouth 2 (two) times daily with a meal.     . norethindrone (AYGESTIN) 5 MG tablet Take 10 mg by mouth daily.     . phentermine 37.5 MG capsule Take 37.5 mg by mouth every morning.  0   No current facility-administered medications on file prior to visit.     Review of Systems:  As per HPI- otherwise negative.   Physical Examination: Vitals:   11/25/16 1331  BP: 134/74  Pulse: 88  Temp: 98.1 F (36.7 C)  Vitals:   11/25/16 1331  Weight: 299 lb 12.8 oz (136 kg)  Height: 5\' 8"  (1.727 m)   Body mass index is 45.58 kg/m. Ideal Body Weight: Weight in (lb) to have BMI = 25: 164.1  GEN: WDWN, NAD, Non-toxic, A & O x 3, obese but has lost, looks well HEENT: Atraumatic, Normocephalic. Neck supple. No masses, No LAD. Ears and Nose: No external deformity. CV: RRR, No M/G/R. No JVD. No thrill. No extra heart sounds. PULM: CTA B, no wheezes, crackles, rhonchi. No retractions. No resp. distress. No accessory muscle use. EXTR: No c/c/e.  No soft or pitting edema of the LE today.  Her ankles are large but this is likely just her build  NEURO Normal gait.  PSYCH: Normally interactive. Conversant. Not depressed or anxious appearing.  Calm demeanor.    Assessment and Plan: PCOS (polycystic ovarian syndrome) - Plan: Hemoglobin  A1c  Lower extremity edema - Plan: hydrochlorothiazide (HYDRODIURIL) 25 MG tablet, hydrochlorothiazide (HYDRODIURIL) 25 MG tablet  Screening for hyperlipidemia - Plan: Lipid panel  Screening for diabetes mellitus - Plan: Hemoglobin A1c  History of hyperthyroidism - Plan: TSH  Morbid obesity, unspecified obesity type (HCC)  She has lost weight- great job! Recent CBC and CMP normal- continue HCTZ for edema She is on metformin for PCOS- her OBG also wants an A1c, will do today Also will check her cholesterol Periodic TSH Plan to visit in 6 months   Signed Abbe AmsterdamJessica Lamar Meter, MD

## 2016-11-25 NOTE — Patient Instructions (Addendum)
It was great to see you today- take care and I will be in touch with your labs asap.   Great job on your weight loss so far- you should be proud!!    Please double check with the person who draws your blood, but I don't think that any of your labs today will be sent to [facility]

## 2016-11-29 ENCOUNTER — Encounter: Payer: Self-pay | Admitting: Family Medicine

## 2016-11-29 ENCOUNTER — Telehealth: Payer: Self-pay | Admitting: Family Medicine

## 2016-11-29 MED ORDER — DOXYCYCLINE HYCLATE 100 MG PO CAPS: 100.0000 mg | ORAL_CAPSULE | Freq: Two times a day (BID) | ORAL | 0 refills | Status: DC

## 2016-11-29 NOTE — Telephone Encounter (Signed)
Patient called stating that when she was walking her dog last night she got caught up in some branches and now has what looks like a leg abrasion. She states now that area is red puffy and hot to the tough. She states that last time something like this happened she ended up in the hospital with a serious infection, cellulitis. She is very worried and concerned. Please advise.   Phone: [phone]

## 2016-11-29 NOTE — Telephone Encounter (Signed)
Called her back- she has had a lot of trouble with cellulitis of her legs in years past.  Yesterday she bumped her leg on a box at work.  Did not think much of it, but noted a red spot on the leg last night.  This am the area was more red and warm.  No fever or systemic sx.  Will cover her with doxycycline to help prevent infection from worsening.  No chance of current pregnancy.  She will let me know if not improving!

## 2016-11-29 NOTE — Telephone Encounter (Signed)
Spoke to pt to see if she would like to come in to be seen. Pt states that right now she is at work and will not get off until 4:30pm. Pt states that she is on call at her job until Wednesday. She is willing to go to Urgent Care but states that if she receives a call from work she would have to leave immediately whether she'd been seen or not.  Pt states that the lower right leg is bruised, red, warm and tender to the touch. Due to a serious cellulitis infection she had in the past she is very concerned. Please advise.

## 2017-03-29 DIAGNOSIS — R197 Diarrhea, unspecified: Secondary | ICD-10-CM | POA: Diagnosis not present

## 2017-05-13 DIAGNOSIS — Z0100 Encounter for examination of eyes and vision without abnormal findings: Secondary | ICD-10-CM | POA: Diagnosis not present

## 2017-05-13 DIAGNOSIS — Z01419 Encounter for gynecological examination (general) (routine) without abnormal findings: Secondary | ICD-10-CM | POA: Diagnosis not present

## 2017-05-26 ENCOUNTER — Ambulatory Visit (INDEPENDENT_AMBULATORY_CARE_PROVIDER_SITE_OTHER): Payer: 59 | Admitting: Family Medicine

## 2017-05-26 VITALS — BP 122/82 | HR 80 | Temp 98.3°F | Ht 68.0 in | Wt 320.0 lb

## 2017-05-26 DIAGNOSIS — Z8639 Personal history of other endocrine, nutritional and metabolic disease: Secondary | ICD-10-CM | POA: Diagnosis not present

## 2017-05-26 DIAGNOSIS — E282 Polycystic ovarian syndrome: Secondary | ICD-10-CM

## 2017-05-26 DIAGNOSIS — R946 Abnormal results of thyroid function studies: Secondary | ICD-10-CM

## 2017-05-26 DIAGNOSIS — L539 Erythematous condition, unspecified: Secondary | ICD-10-CM | POA: Diagnosis not present

## 2017-05-26 DIAGNOSIS — Z5181 Encounter for therapeutic drug level monitoring: Secondary | ICD-10-CM

## 2017-05-26 DIAGNOSIS — R635 Abnormal weight gain: Secondary | ICD-10-CM | POA: Diagnosis not present

## 2017-05-26 DIAGNOSIS — R600 Localized edema: Secondary | ICD-10-CM

## 2017-05-26 DIAGNOSIS — R7989 Other specified abnormal findings of blood chemistry: Secondary | ICD-10-CM

## 2017-05-26 MED ORDER — METRONIDAZOLE 1 % EX GEL: Freq: Every day | CUTANEOUS | 0 refills | Status: DC

## 2017-05-26 NOTE — Patient Instructions (Addendum)
It was a pleasure to see you again today! I will be in touch with your labs asap Try the metrondiazole gel on your skin as needed for redness and irritation Do work on gradual weight loss again- take care!

## 2017-05-26 NOTE — Progress Notes (Addendum)
[facility] [address], [address] [place], [place] [address] [phone] [phone]  Date:  05/26/2017   Name:  Cassandra CopaJessica L Hefley   DOB:  02/26/1981   MRN:  [id number]  PCP:  Pearline Cablesopland, Derrisha C, MD    Chief Complaint: Follow-up   History of Present Illness:  Cassandra Levy is a 36 y.o. very pleasant female patient who presents with the following:  Here today for a follow-up visit Here today with her MIL She is doing well- no further issues with her leg cellulitis.  In fact her legs have been quiet for some months now.  She is taking hctz 25 once a day as needed for her leg edema- may take 1.5 tablets this summer when it is hotter  She came off the phentermine per her OBG- they are taking a break from this but may go back  She is also off metformin - her OBG stopped this as her A1c has looked ok  Lab Results  Component Value Date   TSH 3.60 11/25/2016   She has noted some redness and small pustules of her bilateral cheeks which seem to occur more if she is hot or her skin is irritated.   She has tried some OTC products but notes that her skin is very sensitive  Notes that she has gained some weight- she plans to work on getting this off again Her sister is getting married next spring and she wants to look good for the wedding  Wt Readings from Last 3 Encounters:  05/26/17 (!) 320 lb (145.2 kg)  11/25/16 299 lb 12.8 oz (136 kg)  10/08/16 294 lb (133.4 kg)   BP Readings from Last 3 Encounters:  05/26/17 132/80  11/25/16 134/74  10/08/16 122/82     Patient Active Problem List   Diagnosis Date Noted  . Morbid obesity (HCC) 10/03/2015  . Hyperthyroidism 01/20/2014  . PCOS (polycystic ovarian syndrome) 12/13/2013  . Cellulitis of left lower extremity 12/03/2013  . Cellulitis 12/03/2013  . Lower extremity edema 12/03/2013  . Obesity 10/08/2012  . Goiter 06/27/2012  . Constipation 06/27/2012    Past Medical History:   Diagnosis Date  . Allergy   . Anemia   . Thyroid disease     Past Surgical History:  Procedure Laterality Date  . CHOLECYSTECTOMY    . COLPOSCOPY      Social History  Substance Use Topics  . Smoking status: Never Smoker  . Smokeless tobacco: Not on file  . Alcohol use No    Family History  Problem Relation Age of Onset  . Stroke Mother   . Leukemia Other     Allergies  Allergen Reactions  . Amoxicillin Swelling    Tightness in throat  . Cleocin [Clindamycin Hcl]     hives    Medication list has been reviewed and updated.  Current Outpatient Prescriptions on File Prior to Visit  Medication Sig Dispense Refill  . EPINEPHrine (EPIPEN) 0.3 mg/0.3 mL SOAJ injection Inject 0.3 mLs (0.3 mg total) into the muscle once. 2 Device 2  . hydrochlorothiazide (HYDRODIURIL) 25 MG tablet Take 1 tablet (25 mg total) by mouth daily. 90 tablet 1  . norethindrone (AYGESTIN) 5 MG tablet Take 10 mg by mouth daily.      No current facility-administered medications on file prior to visit.     Review of Systems:  As per HPI- otherwise negative.   Physical Examination: Vitals:  05/26/17 1455  BP: 132/80  Pulse: 80  Temp: 98.3 F (36.8 C)   Vitals:   05/26/17 1455  Weight: (!) 320 lb (145.2 kg)  Height: 5\' 8"  (1.727 m)   Body mass index is 48.66 kg/m. Ideal Body Weight: Weight in (lb) to have BMI = 25: 164.1  GEN: WDWN, NAD, Non-toxic, A & O x 3, obese, otherwise looks well HEENT: Atraumatic, Normocephalic. Neck supple. No masses, No LAD.  Bilateral TM wnl, oropharynx normal.  PEERL,EOMI.   She does have redness, irritation and small pustules of her cheeks c/w rosacea Ears and Nose: No external deformity. CV: RRR, No M/G/R. No JVD. No thrill. No extra heart sounds. PULM: CTA B, no wheezes, crackles, rhonchi. No retractions. No resp. distress. No accessory muscle use. ABD: S, NT, ND, +BS. No rebound. No HSM. EXTR: No c/c/e. No evidence of cellulitis or irriation of her  legs today NEURO Normal gait.  PSYCH: Normally interactive. Conversant. Not depressed or anxious appearing.  Calm demeanor.    Assessment and Plan: History of thyroid disease - Plan: TSH  PCOS (polycystic ovarian syndrome)  Weight gain - Plan: TSH, Hemoglobin A1c  Facial erythema - Plan: metroNIDAZOLE (METROGEL) 1 % gel  Medication monitoring encounter - Plan: CBC, Comprehensive metabolic panel  Lower extremity edema  Here today for a recheck visit Labs pending as above Her leg cellulitis and edema is under control today- she will continue to monitor it closely Continue HCTZ Check CMP Try metrogel for rosacea   Signed Abbe Amsterdam, MD  Received her labs- message to pt Results for orders placed or performed in visit on 05/26/17  TSH  Result Value Ref Range   TSH 4.75 (H) 0.35 - 4.50 uIU/mL  Hemoglobin A1c  Result Value Ref Range   Hgb A1c MFr Bld 5.4 4.6 - 6.5 %  CBC  Result Value Ref Range   WBC 8.5 4.0 - 10.5 K/uL   RBC 4.52 3.87 - 5.11 Mil/uL   Platelets 316.0 150.0 - 400.0 K/uL   Hemoglobin 13.7 12.0 - 15.0 g/dL   HCT 40.9 81.1 - 91.4 %   MCV 90.8 78.0 - 100.0 fl   MCHC 33.4 30.0 - 36.0 g/dL   RDW 78.2 95.6 - 21.3 %  Comprehensive metabolic panel  Result Value Ref Range   Sodium 139 135 - 145 mEq/L   Potassium 4.2 3.5 - 5.1 mEq/L   Chloride 104 96 - 112 mEq/L   CO2 32 19 - 32 mEq/L   Glucose, Bld 64 (L) 70 - 99 mg/dL   BUN 13 6 - 23 mg/dL   Creatinine, Ser 0.86 0.40 - 1.20 mg/dL   Total Bilirubin 0.4 0.2 - 1.2 mg/dL   Alkaline Phosphatase 55 39 - 117 U/L   AST 13 0 - 37 U/L   ALT 18 0 - 35 U/L   Total Protein 7.2 6.0 - 8.3 g/dL   Albumin 4.1 3.5 - 5.2 g/dL   Calcium 9.7 8.4 - 57.8 mg/dL   GFR 46.96 >29.52 mL/min   Repeat TSH in approx 2 weeks

## 2017-05-26 NOTE — Progress Notes (Signed)
Pre visit review using our clinic tool,if applicable. No additional management support is needed unless otherwise documented below in the visit note.  

## 2017-05-27 ENCOUNTER — Encounter: Payer: Self-pay | Admitting: Family Medicine

## 2017-05-27 LAB — COMPREHENSIVE METABOLIC PANEL
ALBUMIN: 4.1 g/dL (ref 3.5–5.2)
ALT: 18 U/L (ref 0–35)
AST: 13 U/L (ref 0–37)
Alkaline Phosphatase: 55 U/L (ref 39–117)
BUN: 13 mg/dL (ref 6–23)
CHLORIDE: 104 meq/L (ref 96–112)
CO2: 32 mEq/L (ref 19–32)
CREATININE: 0.97 mg/dL (ref 0.40–1.20)
Calcium: 9.7 mg/dL (ref 8.4–10.5)
GFR: 69.1 mL/min (ref >60.00)
GLUCOSE: 64 mg/dL — AB (ref 70–99)
Potassium: 4.2 mEq/L (ref 3.5–5.1)
SODIUM: 139 meq/L (ref 135–145)
Total Bilirubin: 0.4 mg/dL (ref 0.2–1.2)
Total Protein: 7.2 g/dL (ref 6.0–8.3)

## 2017-05-27 LAB — CBC
HCT: 41.1 % (ref 36.0–46.0)
Hemoglobin: 13.7 g/dL (ref 12.0–15.0)
MCHC: 33.4 g/dL (ref 30.0–36.0)
MCV: 90.8 fl (ref 78.0–100.0)
Platelets: 316 10*3/uL (ref 150.0–400.0)
RBC: 4.52 Mil/uL (ref 3.87–5.11)
RDW: 13.8 % (ref 11.5–15.5)
WBC: 8.5 10*3/uL (ref 4.0–10.5)

## 2017-05-27 LAB — HEMOGLOBIN A1C: Hgb A1c MFr Bld: 5.4 % (ref 4.6–6.5)

## 2017-05-27 LAB — TSH: TSH: 4.75 u[IU]/mL — ABNORMAL HIGH (ref 0.35–4.50)

## 2017-05-27 NOTE — Addendum Note (Signed)
Addended by: Abbe AmsterdamOPLAND, Shahara C on: 05/27/2017 12:37 PM   Modules accepted: Orders

## 2017-05-29 ENCOUNTER — Encounter: Payer: Self-pay | Admitting: Family Medicine

## 2017-05-30 NOTE — Telephone Encounter (Signed)
Called pt to make sure she received message from provider regarding Metronidazole Topical 1% Gel not being covered by insurance. Message was sent through MyChart. Pt made aware and will check with pharmacy on price.

## 2017-07-04 ENCOUNTER — Other Ambulatory Visit (INDEPENDENT_AMBULATORY_CARE_PROVIDER_SITE_OTHER): Payer: 59

## 2017-07-04 DIAGNOSIS — R946 Abnormal results of thyroid function studies: Secondary | ICD-10-CM | POA: Diagnosis not present

## 2017-07-04 DIAGNOSIS — R7989 Other specified abnormal findings of blood chemistry: Secondary | ICD-10-CM

## 2017-07-04 LAB — T3, FREE: T3 FREE: 3.3 pg/mL (ref 2.3–4.2)

## 2017-07-04 LAB — TSH: TSH: 3.95 u[IU]/mL (ref 0.35–4.50)

## 2017-07-05 LAB — T4: T4, Total: 8.3 ug/dL (ref 4.5–12.0)

## 2017-07-22 ENCOUNTER — Encounter: Payer: Self-pay | Admitting: Registered"

## 2017-07-22 ENCOUNTER — Encounter: Payer: 59 | Attending: Obstetrics & Gynecology | Admitting: Registered"

## 2017-07-22 DIAGNOSIS — Z713 Dietary counseling and surveillance: Secondary | ICD-10-CM | POA: Diagnosis not present

## 2017-07-22 DIAGNOSIS — E282 Polycystic ovarian syndrome: Secondary | ICD-10-CM | POA: Diagnosis not present

## 2017-07-22 NOTE — Progress Notes (Signed)
Medical Nutrition Therapy:  Appt start time: 1615 end time:  1715.  Assessment:  Primary concerns today: Pt states she her highest weight was 400 lbs, and now she reports she weighs 328 lb. Pt states she wants to lose more weight for better health and also wants to have a baby. Pt states she felt her best at 185 lb but then at the age of about [age] she gain weight quickly up to 400 lbs. Pt states she had years of Thyroid treatments but has been discontinued because he thyroid lab values are normal now.  Pt states her current obgyn diagnosed her with PCOS and started her on phentermine for weight loss. Pt states she was not aware that the medication is an appetite suppressant. Pt states she has only taken a couple of days.    Per chart: HDL 35 / LDL 143  PCOS assessment: Infertility: 2 pregnancies, both miscarriage Menstrual cycle: always has been heavy and long, cramps (kept me out of school). Was regular until 36 yo  Hair growth: no Acne: yes, just since the weight gain  Fatigue: yes    Preferred Learning Style:   No preference indicated   Learning Readiness:   Contemplating  MEDICATIONS: reviewed   DIETARY INTAKE: Pt counts calories and stays within 1800-2000 cal range  Avoided foods include pasta, rice and bread and fast food.    24-hr recall:  B ( AM): yogurt, blueberries, granola (280 cal) OR carnation high protein drink (220 cal)  Snk ( AM): none (not hungry, doesn't feel low blood sugar)    L ( PM): grilled sante fe chicken salad, by [employer] (250 cal) Snk ( PM): none D ( PM): chicken, squash, mushrooms, no carbs OR or steak, broccoli, creamed spinach or sweet potatoes Snk ( PM): none Beverages: water, sweet tea rare and only at restaurant, occasional soda  Usual physical activity: walks dogs daily. Active in her work. 3x week at gym  Estimated energy needs: 2100 calories 235 g carbohydrates 158 g protein 58 g fat  Progress Towards Goal(s):  In  progress.   Nutritional Diagnosis:  NB-1.1 Food and nutrition-related knowledge deficit As related to appropriate amount of daily carboydrates.  As evidenced by pt stated avoidance of carbs and diet recall.    Intervention:  Nutrition Education. Discussed the role of macro nutrients in the diet for good health and energy. Discussed factors that affect energy and weight. Discussed body image. Discussed supplements that may help with PCOS symptoms including fertility.  Plan: Consider asking to have your Vit D level check Consider taking vit D supplement 1000 or 2000 iu Have PB & Honey on toast  Consider adding at least a handful of peanuts and or almonds daily Consider watching "Embrace" documentary Be sure to include a carbohydrate with your dinner  Teaching Method Utilized:  Visual Auditory  Handouts given during visit include:  PCOS tips  PCOS resources  Barriers to learning/adherence to lifestyle change: hesistant to make changes that may cause weight gain  Demonstrated degree of understanding via:  Teach Back   Monitoring/Evaluation:  Dietary intake, exercise, and body weight in 2 month(s).

## 2017-07-22 NOTE — Patient Instructions (Addendum)
Consider asking to have your Vit D level check Consider taking vit D supplement 1000 or 2000 iu Have PB & Honey on toast  Consider adding at least a handful of peanuts and or almonds daily Consider watching "Embrace" documentary Be sure to include a carbohydrate with your dinner

## 2017-07-25 ENCOUNTER — Encounter: Payer: Self-pay | Admitting: Family Medicine

## 2017-07-25 ENCOUNTER — Ambulatory Visit (INDEPENDENT_AMBULATORY_CARE_PROVIDER_SITE_OTHER): Payer: 59 | Admitting: Family Medicine

## 2017-07-25 VITALS — BP 128/70 | HR 79 | Temp 98.2°F | Ht 68.0 in | Wt 327.4 lb

## 2017-07-25 DIAGNOSIS — J0100 Acute maxillary sinusitis, unspecified: Secondary | ICD-10-CM

## 2017-07-25 MED ORDER — FLUTICASONE PROPIONATE 50 MCG/ACT NA SUSP: 2.0000 | Freq: Every day | NASAL | 1 refills | Status: DC

## 2017-07-25 MED ORDER — DOXYCYCLINE HYCLATE 100 MG PO TABS: 100.0000 mg | ORAL_TABLET | Freq: Two times a day (BID) | ORAL | 0 refills | Status: AC

## 2017-07-25 NOTE — Progress Notes (Signed)
Chief Complaint  Patient presents with  . Sinus Problem    sinus pressure,ears hurt,congestion and drainage,cough(dry)-sxs started on Mon    Cassandra Levy here for URI complaints.  Duration: 4 days  Associated symptoms: sinus congestion, sinus pain, ear pain, sore throat, cough, and low grade fever Denies: rhinorrhea, itchy watery eyes, ear drainage, wheezing, shortness of breath, myalgia and rigors Treatment to date: Lozenges, take Claritin for allergies Sick contacts: No  ROS:  Const: +low grade fevers HEENT: As noted in HPI Lungs: No SOB  Past Medical History:  Diagnosis Date  . Allergy   . Anemia   . Thyroid disease    Family History  Problem Relation Age of Onset  . Stroke Mother   . Leukemia Other     BP 128/70 (BP Location: Left Arm, Patient Position: Sitting, Cuff Size: Large)   Pulse 79   Temp 98.2 F (36.8 C) (Oral)   Ht 5\' 8"  (1.727 m)   Wt (!) 327 lb 6.4 oz (148.5 kg)   LMP 07/25/2017 (Exact Date)   SpO2 98%   BMI 49.78 kg/m  General: Awake, alert, appears stated age HEENT: AT, [place], ears patent b/l and TM's neg, nares patent w/o discharge, +TTP over b/l max sinuses, pharynx pink and without exudates, MMM Neck: No masses or asymmetry Heart: RRR, no murmurs Lungs: CTAB, no accessory muscle use Psych: Age appropriate judgment and insight, normal mood and affect  Acute maxillary sinusitis, recurrence not specified - Plan: doxycycline (VIBRA-TABS) 100 MG tablet, fluticasone (FLONASE) 50 MCG/ACT nasal spray  Orders as above. Gave info for when to take abx- worsening for 3 consecutive days, worsening by day 10, or no improvement after day 14. Flonase for ETD.  Continue to push fluids, practice good hand hygiene, cover mouth when coughing. F/u prn. If starting to experience fevers, shaking, or shortness of breath, seek immediate care. Pt voiced understanding and agreement to the plan.  Jilda Rocheicholas Paul Princess AnneWendling, DO 07/25/17 10:45 AM

## 2017-07-25 NOTE — Patient Instructions (Signed)
Continue to push fluids, practice good hand hygiene, and cover your mouth if you cough.  Most sinus infections are viral in etiology and antibiotics will not be helpful. That being said, if you start having worsening symptoms over 3 days, you are worsening by day 10 or not improving by day 14, go ahead and take it. You are on Day 5 as of now.   Let us know if you need anything.

## 2017-09-15 ENCOUNTER — Ambulatory Visit (INDEPENDENT_AMBULATORY_CARE_PROVIDER_SITE_OTHER): Payer: 59 | Admitting: Medical

## 2017-09-15 ENCOUNTER — Encounter: Payer: Self-pay | Admitting: Medical

## 2017-09-15 VITALS — BP 120/68 | HR 68 | Temp 98.1°F | Ht 68.0 in | Wt 327.2 lb

## 2017-09-15 DIAGNOSIS — J3089 Other allergic rhinitis: Secondary | ICD-10-CM | POA: Diagnosis not present

## 2017-09-15 DIAGNOSIS — J029 Acute pharyngitis, unspecified: Secondary | ICD-10-CM | POA: Diagnosis not present

## 2017-09-15 DIAGNOSIS — J0100 Acute maxillary sinusitis, unspecified: Secondary | ICD-10-CM

## 2017-09-15 DIAGNOSIS — J040 Acute laryngitis: Secondary | ICD-10-CM | POA: Diagnosis not present

## 2017-09-15 DIAGNOSIS — H6690 Otitis media, unspecified, unspecified ear: Secondary | ICD-10-CM | POA: Diagnosis not present

## 2017-09-15 MED ORDER — AZELASTINE HCL 0.1 % NA SOLN
2.0000 | Freq: Two times a day (BID) | NASAL | 2 refills | Status: DC
Start: 2017-09-15 — End: 2017-10-15

## 2017-09-15 MED ORDER — AZITHROMYCIN 250 MG PO TABS: ORAL_TABLET | ORAL | 0 refills | Status: DC

## 2017-09-15 MED ORDER — BENZONATATE 100 MG PO CAPS: 100.0000 mg | ORAL_CAPSULE | Freq: Three times a day (TID) | ORAL | 0 refills | Status: DC | PRN

## 2017-09-15 NOTE — Patient Instructions (Addendum)
For allergies continue flonase but can add astelin spray. Continue claritin.  For sinus infection and probable all ear infection rx azithromycin antibiotic.  For cough prescribe benzonatate  Rest voice for laryngitis and hydrate. Rapid strep test was positive. Azithromycin has good coverage and you have allergies to pcn so choice limited.  Follow up in 7-10 days or as needed

## 2017-09-15 NOTE — Progress Notes (Signed)
Subjective:    Patient ID: Cassandra Levy, female    DOB: Aug 17, 1981, 36 y.o.   MRN: [id number]  HPI  Pt states feeling ill. She first felt nasal congestion, post nasal drainage, laryngitis, st and fatigue.  Pt has had dry cough at night and during the day. No sob or wheezing.  Pt does feel fatigued. Muscles not ache.  No fever, no chills or sweats.  When blows nose in am will blow out colored mucous.  LMP- Sept 10, 2018.     Review of Systems  Constitutional: Positive for fatigue. Negative for chills and fever.  HENT: Positive for postnasal drip, sinus pain, sinus pressure, sneezing, sore throat and voice change. Negative for congestion and ear pain.        Ear pressure both sides.  Respiratory: Positive for cough. Negative for chest tightness, shortness of breath and wheezing.   Cardiovascular: Negative for chest pain and palpitations.  Gastrointestinal: Negative for abdominal pain.  Musculoskeletal: Negative for arthralgias, back pain, myalgias and neck stiffness.  Skin: Negative for rash.  Neurological: Negative for dizziness and headaches.  Hematological: Negative for adenopathy. Does not bruise/bleed easily.  Psychiatric/Behavioral: Negative for confusion.   Past Medical History:  Diagnosis Date  . Allergy   . Anemia   . Thyroid disease      Social History   Social History  . Marital status: Married    Spouse name: N/A  . Number of children: N/A  . Years of education: N/A   Occupational History  . Not on file.   Social History Main Topics  . Smoking status: Never Smoker  . Smokeless tobacco: Never Used  . Alcohol use No  . Drug use: No  . Sexual activity: Not on file   Other Topics Concern  . Not on file   Social History Narrative  . No narrative on file    Past Surgical History:  Procedure Laterality Date  . CHOLECYSTECTOMY    . COLPOSCOPY      Family History  Problem Relation Age of Onset  . Stroke Mother   . Leukemia Other      Allergies  Allergen Reactions  . Amoxicillin Swelling    Tightness in throat  . Bee Venom   . Cleocin [Clindamycin Hcl]     hives    Current Outpatient Prescriptions on File Prior to Visit  Medication Sig Dispense Refill  . EPINEPHrine (EPIPEN) 0.3 mg/0.3 mL SOAJ injection Inject 0.3 mLs (0.3 mg total) into the muscle once. 2 Device 2  . fluticasone (FLONASE) 50 MCG/ACT nasal spray Place 2 sprays into both nostrils daily. 16 g 1  . hydrochlorothiazide (HYDRODIURIL) 25 MG tablet Take 1 tablet (25 mg total) by mouth daily. 90 tablet 1  . metroNIDAZOLE (METROGEL) 1 % gel Apply topically daily. Use on your face as needed for irritation 45 g 0  . norethindrone (AYGESTIN) 5 MG tablet Take 10 mg by mouth daily.     . phentermine 15 MG capsule Take 15 mg by mouth every morning.     No current facility-administered medications on file prior to visit.     BP 120/68 (BP Location: Left Arm, Patient Position: Sitting, Cuff Size: Large)   Pulse 68   Temp 98.1 F (36.7 C) (Oral)   Ht  (1.727 m)   Wt (!) 327 lb 3.2 oz (148.4 kg)   LMP 08/25/2017   SpO2 100%   BMI 49.75 kg/m      Objective:  Physical Exam  General  Mental Status - Alert. General Appearance - Well groomed. Not in acute distress.  Skin Rashes- No Rashes.  HEENT Head- Normal. Ear Auditory Canal - Left- Normal. Right - Normal.Tympanic Membrane- Left- Normal. Right- Normal. Eye Sclera/Conjunctiva- Left- Normal. Right- Normal. Nose & Sinuses Nasal Mucosa- Left-  Boggy and Congested. Right-  Boggy and  Congested.Bilateral maxillary and frontal sinus pressure. Mouth & Throat Lips: Upper Lip- Normal: no dryness, cracking, pallor, cyanosis, or vesicular eruption. Lower Lip-Normal: no dryness, cracking, pallor, cyanosis or vesicular eruption. Buccal Mucosa- Bilateral- No Aphthous ulcers. Oropharynx- No Discharge or Erythema. Tonsils: Characteristics- Bilateral- mild  Erythema . Size/Enlargement- Bilateral- 1+  enlargement. Discharge- bilateral-None.  Neck Neck- Supple. No Masses. Faint enlarged submandibular glands.   Chest and Lung Exam Auscultation: Breath Sounds:-Clear even and unlabored.  Cardiovascular Auscultation:Rythm- Regular, rate and rhythm. Murmurs & Other Heart Sounds:Ausculatation of the heart reveal- No Murmurs.  Lymphatic Head & Neck General Head & Neck Lymphatics: Bilateral: Description- No Localized lymphadenopathy.       Assessment & Plan:  For allergies continue flonase but can add astelin spray. Continue claritin.  For sinus infection and probable all ear infection rx azithromycin antibiotic.  For cough prescribe benzonatate  Rest voice for laryngitis and hydrate. Rapid strep test was positive. Azithromycin has good coverage and you have allergies to pcn so choice limited.  Follow up in 7-10 days or as needed  Kyley Solow, Ramon Dredge, [employer]

## 2017-09-23 ENCOUNTER — Ambulatory Visit: Payer: 59 | Admitting: Registered"

## 2017-10-15 ENCOUNTER — Ambulatory Visit (INDEPENDENT_AMBULATORY_CARE_PROVIDER_SITE_OTHER): Payer: 59 | Admitting: Family Medicine

## 2017-10-15 ENCOUNTER — Encounter: Payer: Self-pay | Admitting: Family Medicine

## 2017-10-15 VITALS — BP 132/82 | HR 94 | Temp 98.5°F | Ht 68.0 in | Wt 334.2 lb

## 2017-10-15 DIAGNOSIS — D751 Secondary polycythemia: Secondary | ICD-10-CM | POA: Diagnosis not present

## 2017-10-15 DIAGNOSIS — R52 Pain, unspecified: Secondary | ICD-10-CM | POA: Diagnosis not present

## 2017-10-15 DIAGNOSIS — Z5181 Encounter for therapeutic drug level monitoring: Secondary | ICD-10-CM | POA: Diagnosis not present

## 2017-10-15 DIAGNOSIS — J029 Acute pharyngitis, unspecified: Secondary | ICD-10-CM | POA: Diagnosis not present

## 2017-10-15 DIAGNOSIS — R5383 Other fatigue: Secondary | ICD-10-CM

## 2017-10-15 DIAGNOSIS — R0981 Nasal congestion: Secondary | ICD-10-CM

## 2017-10-15 LAB — POCT RAPID STREP A (OFFICE): Rapid Strep A Screen: NEGATIVE

## 2017-10-15 LAB — CBC
HEMATOCRIT: 47 % — AB (ref 36.0–46.0)
HEMOGLOBIN: 15.4 g/dL — AB (ref 12.0–15.0)
MCHC: 32.8 g/dL (ref 30.0–36.0)
MCV: 91.4 fl (ref 78.0–100.0)
Platelets: 301 10*3/uL (ref 150.0–400.0)
RBC: 5.14 Mil/uL — ABNORMAL HIGH (ref 3.87–5.11)
RDW: 13.7 % (ref 11.5–15.5)
WBC: 9.3 10*3/uL (ref 4.0–10.5)

## 2017-10-15 LAB — BASIC METABOLIC PANEL
BUN: 17 mg/dL (ref 6–23)
CALCIUM: 9.6 mg/dL (ref 8.4–10.5)
CO2: 27 meq/L (ref 19–32)
CREATININE: 0.7 mg/dL (ref 0.40–1.20)
Chloride: 103 mEq/L (ref 96–112)
GFR: 100.46 mL/min (ref >60.00)
Glucose, Bld: 84 mg/dL (ref 70–99)
Potassium: 3.8 mEq/L (ref 3.5–5.1)
Sodium: 138 mEq/L (ref 135–145)

## 2017-10-15 LAB — POCT INFLUENZA A/B
Influenza A, POC: NEGATIVE
Influenza B, POC: NEGATIVE

## 2017-10-15 MED ORDER — PREDNISONE 20 MG PO TABS: ORAL_TABLET | ORAL | 0 refills | Status: DC

## 2017-10-15 NOTE — Addendum Note (Signed)
Addended by: Abbe AmsterdamOPLAND, Lakethia C on: 10/15/2017 09:23 PM   Modules accepted: Orders

## 2017-10-15 NOTE — Patient Instructions (Signed)
Let's get labs for you today- I think that prednisone may be helpful for you. However I want to get your white blood cell count back first- should be in later today or tomorrow.   Assuming your white count is normal please fill the prednisone rx that I gave you today. If your white count is high we may want to consider further antibiotics

## 2017-10-15 NOTE — Progress Notes (Addendum)
[facility] at [facility] [address] [place], [place] [address] [phone]  Date:  10/15/2017   Name:  Cassandra Levy   DOB:  01-31-1981   MRN:  [id number]  PCP:  Pearline Cables, MD    Chief Complaint: Cough (c/o possible sinus infection, bilateral ear pain. Pt just got over strep from 09/15/2017. Also c/o cough, fatigue, and headache. )   History of Present Illness:  Cassandra Levy is a 36 y.o. very pleasant female patient who presents with the following:  I last saw her in June of this year Pt was in in August and treated for sinusitis with doxycycline Then returned on 10/1 and was treated for pharyngitis with a zpack - pt reports that she did test positive for strep on 10/1  She did get back to normal after her illness in August.  However she has not gotten back to normal since she was here on 10/1 She was in the hospital several days last week as her husband's grandmother was dying. They are not quite sure what she had/if it was contagious   Her BIL has been sick as well with GI symptoms  She notes that her throat is still "raw and on fire," she has felt queasy, her ears hurt Her sinuses feel "like they are stuffed with cotton" She is coughing just a bit The cough is not productive  No vomiting or diarrhea She has not noted any fever She has felt very tired   She is still on her progesterone only OCP and does not suspect pregnancy Never told that she had mono in the past   Patient Active Problem List   Diagnosis Date Noted  . Morbid obesity (HCC) 10/03/2015  . Hyperthyroidism 01/20/2014  . PCOS (polycystic ovarian syndrome) 12/13/2013  . Lower extremity edema 12/03/2013  . Obesity 10/08/2012  . Goiter 06/27/2012  . Constipation 06/27/2012    Past Medical History:  Diagnosis Date  . Allergy   . Anemia   . Thyroid disease     Past Surgical History:  Procedure Laterality Date  .  CHOLECYSTECTOMY    . COLPOSCOPY      Social History  Substance Use Topics  . Smoking status: Never Smoker  . Smokeless tobacco: Never Used  . Alcohol use No    Family History  Problem Relation Age of Onset  . Stroke Mother   . Leukemia Other     Allergies  Allergen Reactions  . Amoxicillin Swelling    Tightness in throat  . Bee Venom   . Cleocin [Clindamycin Hcl]     hives    Medication list has been reviewed and updated.  Current Outpatient Prescriptions on File Prior to Visit  Medication Sig Dispense Refill  . azelastine (ASTELIN) 0.1 % nasal spray Place 2 sprays into both nostrils 2 (two) times daily. Use in each nostril as directed 30 mL 2  . azithromycin (ZITHROMAX) 250 MG tablet Take 2 tablets by mouth on day 1, followed by 1 tablet by mouth daily for 4 days. 6 tablet 0  . benzonatate (TESSALON) 100 MG capsule Take 1 capsule (100 mg total) by mouth 3 (three) times daily as needed for cough. 21 capsule 0  . EPINEPHrine (EPIPEN) 0.3 mg/0.3 mL SOAJ injection Inject 0.3 mLs (0.3 mg total) into the muscle once. 2 Device 2  . fluticasone (FLONASE) 50 MCG/ACT nasal spray Place 2 sprays into both nostrils daily. 16  g 1  . hydrochlorothiazide (HYDRODIURIL) 25 MG tablet Take 1 tablet (25 mg total) by mouth daily. 90 tablet 1  . metroNIDAZOLE (METROGEL) 1 % gel Apply topically daily. Use on your face as needed for irritation 45 g 0  . norethindrone (AYGESTIN) 5 MG tablet Take 10 mg by mouth daily.     . phentermine 15 MG capsule Take 15 mg by mouth every morning.     No current facility-administered medications on file prior to visit.     Review of Systems:  As per HPI- otherwise negative. No measured fever , no particular chills Slept "all day" yesterday as she felt so tired    Physical Examination: Vitals:   10/15/17 1136  BP: 132/82  Pulse: 94  Temp: 98.5 F (36.9 C)  SpO2: 98%   Vitals:   10/15/17 1136  Weight: (!) 334 lb 3.2 oz (151.6 kg)  Height: 5\' 8"   (1.727 m)   Body mass index is 50.81 kg/m. Ideal Body Weight: Weight in (lb) to have BMI = 25: 164.1  GEN: WDWN, NAD, Non-toxic, A & O x 3, obese, otherwise looks well today HEENT: Atraumatic, Normocephalic. Neck supple. No masses, No LAD.  Bilateral TM wnl, oropharynx normal.  PEERL,EOMI.   Nasal cavity is congested  Ears and Nose: No external deformity. CV: RRR, No M/G/R. No JVD. No thrill. No extra heart sounds. PULM: CTA B, no wheezes, crackles, rhonchi. No retractions. No resp. distress. No accessory muscle use. ABD: S, NT, ND, +BS. No rebound. No HSM. EXTR: No c/c/e NEURO Normal gait.  PSYCH: Normally interactive. Conversant. Not depressed or anxious appearing.  Calm demeanor.   Results for orders placed or performed in visit on 10/15/17  POCT Influenza A/B  Result Value Ref Range   Influenza A, POC Negative Negative   Influenza B, POC Negative Negative  POCT rapid strep A  Result Value Ref Range   Rapid Strep A Screen Negative Negative    Assessment and Plan: Sore throat - Plan: POCT rapid strep A, predniSONE (DELTASONE) 20 MG tablet  Body aches - Plan: POCT Influenza A/B  Medication monitoring encounter - Plan: Basic metabolic panel  Other fatigue - Plan: CBC, Epstein-Barr virus VCA antibody panel  Sinus congestion - Plan: predniSONE (DELTASONE) 20 MG tablet  Here today to follow-up from recent illnesses and with continued sx At this time I do not see any definite reason for abx use Will check a CBC- if normal will try a course of prednisone for inflammation in her nose and sinuses Will plan further follow- up pending labs.  Meds ordered this encounter  Medications  . predniSONE (DELTASONE) 20 MG tablet    Sig: Take 2 pills a day for 3 days, then 1 pill a day for 3 days    Dispense:  9 tablet    Refill:  0    Signed Abbe AmsterdamJessica Adriana Lina, MD  Received her labs, message to pt  Results for orders placed or performed in visit on 10/15/17  CBC  Result Value Ref  Range   WBC 9.3 4.0 - 10.5 K/uL   RBC 5.14 (H) 3.87 - 5.11 Mil/uL   Platelets 301.0 150.0 - 400.0 K/uL   Hemoglobin 15.4 (H) 12.0 - 15.0 g/dL   HCT 16.147.0 (H) 09.636.0 - 04.546.0 %   MCV 91.4 78.0 - 100.0 fl   MCHC 32.8 30.0 - 36.0 g/dL   RDW 40.913.7 81.111.5 - 91.415.5 %  Basic metabolic panel  Result Value Ref Range  Sodium 138 135 - 145 mEq/L   Potassium 3.8 3.5 - 5.1 mEq/L   Chloride 103 96 - 112 mEq/L   CO2 27 19 - 32 mEq/L   Glucose, Bld 84 70 - 99 mg/dL   BUN 17 6 - 23 mg/dL   Creatinine, Ser 4.40 0.40 - 1.20 mg/dL   Calcium 9.6 8.4 - 34.7 mg/dL   GFR 425.95 >63.87 mL/min  POCT Influenza A/B  Result Value Ref Range   Influenza A, POC Negative Negative   Influenza B, POC Negative Negative  POCT rapid strep A  Result Value Ref Range   Rapid Strep A Screen Negative Negative

## 2017-10-16 LAB — EPSTEIN-BARR VIRUS VCA ANTIBODY PANEL: EBV NA IgG: 409 U/mL — ABNORMAL HIGH

## 2017-11-25 NOTE — Progress Notes (Deleted)
[name] [address] [place], [place] [address] [phone] [phone]  Date:  11/27/2017   Name:  Cassandra CopaJessica L Huxford   DOB:  08-13-1981   MRN:  [id number]  PCP:  Pearline Cablesopland, Miriam C, MD    Chief Complaint: No chief complaint on file.   History of Present Illness:  Cassandra Levy is a 36 y.o. very pleasant female patient who presents with the following:  Here today for a CPE History of obesity, PCOS, hyperthyroidism  Labs: Pap: 2013- negative Flu: Tetanus: 2015   Patient Active Problem List   Diagnosis Date Noted  . Morbid obesity (HCC) 10/03/2015  . Hyperthyroidism 01/20/2014  . PCOS (polycystic ovarian syndrome) 12/13/2013  . Lower extremity edema 12/03/2013  . Obesity 10/08/2012  . Goiter 06/27/2012  . Constipation 06/27/2012    Past Medical History:  Diagnosis Date  . Allergy   . Anemia   . Thyroid disease     Past Surgical History:  Procedure Laterality Date  . CHOLECYSTECTOMY    . COLPOSCOPY      Social History   Tobacco Use  . Smoking status: Never Smoker  . Smokeless tobacco: Never Used  Substance Use Topics  . Alcohol use: No  . Drug use: No    Family History  Problem Relation Age of Onset  . Stroke Mother   . Leukemia Other     Allergies  Allergen Reactions  . Amoxicillin Swelling    Tightness in throat  . Bee Venom   . Cleocin [Clindamycin Hcl]     hives    Medication list has been reviewed and updated.  Current Outpatient Medications on File Prior to Visit  Medication Sig Dispense Refill  . benzonatate (TESSALON) 100 MG capsule Take 1 capsule (100 mg total) by mouth 3 (three) times daily as needed for cough. 21 capsule 0  . EPINEPHrine (EPIPEN) 0.3 mg/0.3 mL SOAJ injection Inject 0.3 mLs (0.3 mg total) into the muscle once. 2 Device 2  . fluticasone (FLONASE) 50 MCG/ACT nasal spray Place 2 sprays into both nostrils daily. 16 g 1  . hydrochlorothiazide  (HYDRODIURIL) 25 MG tablet Take 1 tablet (25 mg total) by mouth daily. 90 tablet 1  . metroNIDAZOLE (METROGEL) 1 % gel Apply topically daily. Use on your face as needed for irritation 45 g 0  . norethindrone (AYGESTIN) 5 MG tablet Take 10 mg by mouth daily.     . phentermine 15 MG capsule Take 15 mg by mouth every morning.    . predniSONE (DELTASONE) 20 MG tablet Take 2 pills a day for 3 days, then 1 pill a day for 3 days 9 tablet 0   No current facility-administered medications on file prior to visit.     Review of Systems:  ***  Physical Examination: There were no vitals filed for this visit. There were no vitals filed for this visit. There is no height or weight on file to calculate BMI. Ideal Body Weight:    ***  Assessment and Plan: ***  Signed Abbe AmsterdamJessica Heiley Shaikh, MD

## 2017-11-27 ENCOUNTER — Encounter: Payer: 59 | Admitting: Family Medicine

## 2017-12-04 ENCOUNTER — Ambulatory Visit: Payer: 59 | Admitting: Family Medicine

## 2017-12-04 ENCOUNTER — Encounter: Payer: Self-pay | Admitting: Family Medicine

## 2017-12-04 VITALS — BP 120/82 | HR 80 | Temp 98.0°F | Resp 18 | Ht 68.0 in | Wt 340.8 lb

## 2017-12-04 DIAGNOSIS — Z13220 Encounter for screening for lipoid disorders: Secondary | ICD-10-CM

## 2017-12-04 DIAGNOSIS — Z0000 Encounter for general adult medical examination without abnormal findings: Secondary | ICD-10-CM

## 2017-12-04 DIAGNOSIS — R600 Localized edema: Secondary | ICD-10-CM | POA: Diagnosis not present

## 2017-12-04 DIAGNOSIS — E039 Hypothyroidism, unspecified: Secondary | ICD-10-CM

## 2017-12-04 DIAGNOSIS — Z131 Encounter for screening for diabetes mellitus: Secondary | ICD-10-CM | POA: Diagnosis not present

## 2017-12-04 DIAGNOSIS — Z5181 Encounter for therapeutic drug level monitoring: Secondary | ICD-10-CM

## 2017-12-04 DIAGNOSIS — Z8639 Personal history of other endocrine, nutritional and metabolic disease: Secondary | ICD-10-CM | POA: Diagnosis not present

## 2017-12-04 MED ORDER — HYDROCHLOROTHIAZIDE 25 MG PO TABS: 25.0000 mg | ORAL_TABLET | Freq: Every day | ORAL | 3 refills | Status: DC

## 2017-12-04 NOTE — Progress Notes (Addendum)
[facility] [address], [address] [place], [place] [address] [phone] [phone]  Date:  12/04/2017   Name:  Cassandra Levy   DOB:  11-28-1981   MRN:  [id number]  PCP:  Pearline Cablesopland, Lonette C, MD    Chief Complaint: No chief complaint on file.   History of Present Illness:  Cassandra Levy is a [age] y.o. very pleasant female patient who presents with the following:  CPE today- she had to rescheduled from last week due to the snow Last week was very busy at her job She gets her pap from Dr. Juliene PinaMody- this is UTD, she last had in May she thinks  She declines a flu shot today History of thyroid disease  Tetanus is UTD No recent lipids- will do today for her She is taking her HCTZ daily- it helps to prevent swelling of her legs. She wants to continue taking it  Lipids:    Component Value Date/Time   CHOL 192 11/25/2016 1358   TRIG 65.0 11/25/2016 1358   HDL 35.30 (L) 11/25/2016 1358   VLDL 13.0 11/25/2016 1358   CHOLHDL 5 11/25/2016 1358   This am she had coffee, nothing else  Dr. Juliene PinaMody manages her OCP She tried phentermine again but stopped as it made her chest feel tight  Wt Readings from Last 3 Encounters:  12/04/17 (!) 340 lb 12.8 oz (154.6 kg)  10/15/17 (!) 334 lb 3.2 oz (151.6 kg)  09/15/17 (!) 327 lb 3.2 oz (148.4 kg)   She tries to eat a healthy diet, but still has a very hard time losing weight.  She is not exercising as much due to busy schedule at home- parental illness, etc.   She and her husband hope to become pregnant in about a year, and she is concerned about her weight affecting her pregnancy.  She also does have PCOS She is on a progesterone only OCP  Patient Active Problem List   Diagnosis Date Noted  . Morbid obesity (HCC) 10/03/2015  . Hyperthyroidism 01/20/2014  . PCOS (polycystic ovarian syndrome) 12/13/2013  . Lower extremity edema 12/03/2013  . Obesity 10/08/2012  . Goiter 06/27/2012  .  Constipation 06/27/2012    Past Medical History:  Diagnosis Date  . Allergy   . Anemia   . Thyroid disease     Past Surgical History:  Procedure Laterality Date  . CHOLECYSTECTOMY    . COLPOSCOPY      Social History   Tobacco Use  . Smoking status: Never Smoker  . Smokeless tobacco: Never Used  Substance Use Topics  . Alcohol use: No  . Drug use: No    Family History  Problem Relation Age of Onset  . Stroke Mother   . Leukemia Other     Allergies  Allergen Reactions  . Amoxicillin Swelling    Tightness in throat  . Bee Venom   . Cleocin [Clindamycin Hcl]     hives    Medication list has been reviewed and updated.  Current Outpatient Medications on File Prior to Visit  Medication Sig Dispense Refill  . benzonatate (TESSALON) 100 MG capsule Take 1 capsule (100 mg total) by mouth 3 (three) times daily as needed for cough. 21 capsule 0  . EPINEPHrine (EPIPEN) 0.3 mg/0.3 mL SOAJ injection Inject 0.3 mLs (0.3 mg total) into the muscle once. 2 Device 2  . fluticasone (FLONASE) 50 MCG/ACT nasal spray Place 2 sprays into both nostrils daily.  16 g 1  . metroNIDAZOLE (METROGEL) 1 % gel Apply topically daily. Use on your face as needed for irritation 45 g 0  . norethindrone (AYGESTIN) 5 MG tablet Take 10 mg by mouth daily.     . predniSONE (DELTASONE) 20 MG tablet Take 2 pills a day for 3 days, then 1 pill a day for 3 days 9 tablet 0   No current facility-administered medications on file prior to visit.     Review of Systems:  As per HPI- otherwise negative. No fever or chills No CP or SOB No abd pain, nausea, vomiting or diarrhea  Physical Examination: Vitals:   12/04/17 1346 12/04/17 1402  BP: 137/70 120/82  Pulse: 80   Resp: 18   Temp: 98 F (36.7 C)   SpO2: 97%    Vitals:   12/04/17 1346  Weight: (!) 340 lb 12.8 oz (154.6 kg)  Height: 5\' 8"  (1.727 m)   Body mass index is 51.82 kg/m. Ideal Body Weight: Weight in (lb) to have BMI = 25:  164.1  GEN: WDWN, NAD, Non-toxic, A & O x 3, obese, otherwise looks well HEENT: Atraumatic, Normocephalic. Neck supple. No masses, No LAD.  Bilateral TM wnl, oropharynx normal.  PEERL,EOMI.   Ears and Nose: No external deformity. CV: RRR, No M/G/R. No JVD. No thrill. No extra heart sounds. PULM: CTA B, no wheezes, crackles, rhonchi. No retractions. No resp. distress. No accessory muscle use. ABD: S, NT, ND, +BS. No rebound. No HSM. EXTR: No c/c/e NEURO Normal gait.  PSYCH: Normally interactive. Conversant. Not depressed or anxious appearing.  Calm demeanor.    Assessment and Plan: Physical exam  Lower extremity edema - Plan: hydrochlorothiazide (HYDRODIURIL) 25 MG tablet  Medication monitoring encounter - Plan: CBC, Hemoglobin A1c, Lipid panel  History of thyroid disease - Plan: TSH  Screening for diabetes mellitus - Plan: Hemoglobin A1c  Screening for hyperlipidemia - Plan: Lipid panel  Morbid obesity (HCC)  Here today for a CPE Labs pending as above Refilled her hctz Discussed her weight and ways to overcome barriers to exercise- such as working out in the morning before work, so she does not run out of time Will plan further follow- up pending labs.   Signed Abbe Amsterdam, MD  Received her labs 12/22 Results for orders placed or performed in visit on 12/04/17  CBC  Result Value Ref Range   WBC 7.9 4.0 - 10.5 K/uL   RBC 4.75 3.87 - 5.11 Mil/uL   Platelets 358.0 150.0 - 400.0 K/uL   Hemoglobin 14.2 12.0 - 15.0 g/dL   HCT 16.1 09.6 - 04.5 %   MCV 92.7 78.0 - 100.0 fl   MCHC 32.4 30.0 - 36.0 g/dL   RDW 40.9 81.1 - 91.4 %  Hemoglobin A1c  Result Value Ref Range   Hgb A1c MFr Bld 5.4 4.6 - 6.5 %  Lipid panel  Result Value Ref Range   Cholesterol 175 0 - 200 mg/dL   Triglycerides 78.2 0.0 - 149.0 mg/dL   HDL 95.62 >13.08 mg/dL   VLDL 65.7 0.0 - 84.6 mg/dL   LDL Cholesterol 962 (H) 0 - 99 mg/dL   Total CHOL/HDL Ratio 4    NonHDL 130.63   TSH  Result Value  Ref Range   TSH 6.04 (H) 0.35 - 4.50 uIU/mL   Message to pt  Your blood count is normal, A1c (screening for diabetes) is normal, and your cholesterol is overall good. Your TSH is high- this indicates an  underactive thyroid gland.  This may be contributing to difficulty in losing weight!  I know you have been treated for this in the past, and I think we should re-start therapy.  If ok with you I am going to send an rx for levothyroxine to your drug store- please come in for a TSH (lab only) in about one month so we can adjust your dose as needed.    Meds ordered this encounter  Medications  . hydrochlorothiazide (HYDRODIURIL) 25 MG tablet    Sig: Take 1 tablet (25 mg total) by mouth daily.    Dispense:  90 tablet    Refill:  3  . Levothyroxine Sodium 25 MCG CAPS    Sig: Take 1 capsule (25 mcg total) by mouth daily before breakfast.    Dispense:  30 capsule    Refill:  6

## 2017-12-04 NOTE — Patient Instructions (Signed)
Great to see you as always!   I will be in touch with your labs, and we refilled your HCTZ today As we discussed, you might start exercising before work- this is a good way to make sure it gets done!    Let me know if you need anything Health Maintenance, Female Adopting a healthy lifestyle and getting preventive care can go a long way to promote health and wellness. Talk with your health care provider about what schedule of regular examinations is right for you. This is a good chance for you to check in with your provider about disease prevention and staying healthy. In between checkups, there are plenty of things you can do on your own. Experts have done a lot of research about which lifestyle changes and preventive measures are most likely to keep you healthy. Ask your health care provider for more information. Weight and diet Eat a healthy diet  Be sure to include plenty of vegetables, fruits, low-fat dairy products, and lean protein.  Do not eat a lot of foods high in solid fats, added sugars, or salt.  Get regular exercise. This is one of the most important things you can do for your health. ? Most adults should exercise for at least 150 minutes each week. The exercise should increase your heart rate and make you sweat (moderate-intensity exercise). ? Most adults should also do strengthening exercises at least twice a week. This is in addition to the moderate-intensity exercise.  Maintain a healthy weight  Body mass index (BMI) is a measurement that can be used to identify possible weight problems. It estimates body fat based on height and weight. Your health care provider can help determine your BMI and help you achieve or maintain a healthy weight.  For females 51 years of age and older: ? A BMI below 18.5 is considered underweight. ? A BMI of 18.5 to 24.9 is normal. ? A BMI of 25 to 29.9 is considered overweight. ? A BMI of 30 and above is considered obese.  Watch levels of  cholesterol and blood lipids  You should start having your blood tested for lipids and cholesterol at 36 years of age, then have this test every 5 years.  You may need to have your cholesterol levels checked more often if: ? Your lipid or cholesterol levels are high. ? You are older than 36 years of age. ? You are at high risk for heart disease.  Cancer screening Lung Cancer  Lung cancer screening is recommended for adults 80-45 years old who are at high risk for lung cancer because of a history of smoking.  A yearly low-dose CT scan of the lungs is recommended for people who: ? Currently smoke. ? Have quit within the past 15 years. ? Have at least a 30-pack-year history of smoking. A pack year is smoking an average of one pack of cigarettes a day for 1 year.  Yearly screening should continue until it has been 15 years since you quit.  Yearly screening should stop if you develop a health problem that would prevent you from having lung cancer treatment.  Breast Cancer  Practice breast self-awareness. This means understanding how your breasts normally appear and feel.  It also means doing regular breast self-exams. Let your health care provider know about any changes, no matter how small.  If you are in your 20s or 30s, you should have a clinical breast exam (CBE) by a health care provider every 1-3 years as part  of a regular health exam.  If you are 76 or older, have a CBE every year. Also consider having a breast X-ray (mammogram) every year.  If you have a family history of breast cancer, talk to your health care provider about genetic screening.  If you are at high risk for breast cancer, talk to your health care provider about having an MRI and a mammogram every year.  Breast cancer gene (BRCA) assessment is recommended for women who have family members with BRCA-related cancers. BRCA-related cancers include: ? Breast. ? Ovarian. ? Tubal. ? Peritoneal cancers.  Results  of the assessment will determine the need for genetic counseling and BRCA1 and BRCA2 testing.  Cervical Cancer Your health care provider may recommend that you be screened regularly for cancer of the pelvic organs (ovaries, uterus, and vagina). This screening involves a pelvic examination, including checking for microscopic changes to the surface of your cervix (Pap test). You may be encouraged to have this screening done every 3 years, beginning at age 51.  For women ages 92-65, health care providers may recommend pelvic exams and Pap testing every 3 years, or they may recommend the Pap and pelvic exam, combined with testing for human papilloma virus (HPV), every 5 years. Some types of HPV increase your risk of cervical cancer. Testing for HPV may also be done on women of any age with unclear Pap test results.  Other health care providers may not recommend any screening for nonpregnant women who are considered low risk for pelvic cancer and who do not have symptoms. Ask your health care provider if a screening pelvic exam is right for you.  If you have had past treatment for cervical cancer or a condition that could lead to cancer, you need Pap tests and screening for cancer for at least 20 years after your treatment. If Pap tests have been discontinued, your risk factors (such as having a new sexual partner) need to be reassessed to determine if screening should resume. Some women have medical problems that increase the chance of getting cervical cancer. In these cases, your health care provider may recommend more frequent screening and Pap tests.  Colorectal Cancer  This type of cancer can be detected and often prevented.  Routine colorectal cancer screening usually begins at 36 years of age and continues through 36 years of age.  Your health care provider may recommend screening at an earlier age if you have risk factors for colon cancer.  Your health care provider may also recommend using  home test kits to check for hidden blood in the stool.  A small camera at the end of a tube can be used to examine your colon directly (sigmoidoscopy or colonoscopy). This is done to check for the earliest forms of colorectal cancer.  Routine screening usually begins at age 28.  Direct examination of the colon should be repeated every 5-10 years through 36 years of age. However, you may need to be screened more often if early forms of precancerous polyps or small growths are found.  Skin Cancer  Check your skin from head to toe regularly.  Tell your health care provider about any new moles or changes in moles, especially if there is a change in a mole's shape or color.  Also tell your health care provider if you have a mole that is larger than the size of a pencil eraser.  Always use sunscreen. Apply sunscreen liberally and repeatedly throughout the day.  Protect yourself by wearing long  sleeves, pants, a wide-brimmed hat, and sunglasses whenever you are outside.  Heart disease, diabetes, and high blood pressure  High blood pressure causes heart disease and increases the risk of stroke. High blood pressure is more likely to develop in: ? People who have blood pressure in the high end of the normal range (130-139/85-89 mm Hg). ? People who are overweight or obese. ? People who are African American.  If you are 1-31 years of age, have your blood pressure checked every 3-5 years. If you are 58 years of age or older, have your blood pressure checked every year. You should have your blood pressure measured twice-once when you are at a hospital or clinic, and once when you are not at a hospital or clinic. Record the average of the two measurements. To check your blood pressure when you are not at a hospital or clinic, you can use: ? An automated blood pressure machine at a pharmacy. ? A home blood pressure monitor.  If you are between 49 years and 71 years old, ask your health care provider  if you should take aspirin to prevent strokes.  Have regular diabetes screenings. This involves taking a blood sample to check your fasting blood sugar level. ? If you are at a normal weight and have a low risk for diabetes, have this test once every three years after 36 years of age. ? If you are overweight and have a high risk for diabetes, consider being tested at a younger age or more often. Preventing infection Hepatitis B  If you have a higher risk for hepatitis B, you should be screened for this virus. You are considered at high risk for hepatitis B if: ? You were born in a country where hepatitis B is common. Ask your health care provider which countries are considered high risk. ? Your parents were born in a high-risk country, and you have not been immunized against hepatitis B (hepatitis B vaccine). ? You have HIV or AIDS. ? You use needles to inject street drugs. ? You live with someone who has hepatitis B. ? You have had sex with someone who has hepatitis B. ? You get hemodialysis treatment. ? You take certain medicines for conditions, including cancer, organ transplantation, and autoimmune conditions.  Hepatitis C  Blood testing is recommended for: ? Everyone born from [birth year] through 1965. ? Anyone with known risk factors for hepatitis C.  Sexually transmitted infections (STIs)  You should be screened for sexually transmitted infections (STIs) including gonorrhea and chlamydia if: ? You are sexually active and are younger than 36 years of age. ? You are older than 36 years of age and your health care provider tells you that you are at risk for this type of infection. ? Your sexual activity has changed since you were last screened and you are at an increased risk for chlamydia or gonorrhea. Ask your health care provider if you are at risk.  If you do not have HIV, but are at risk, it may be recommended that you take a prescription medicine daily to prevent HIV infection. This  is called pre-exposure prophylaxis (PrEP). You are considered at risk if: ? You are sexually active and do not regularly use condoms or know the HIV status of your partner(s). ? You take drugs by injection. ? You are sexually active with a partner who has HIV.  Talk with your health care provider about whether you are at high risk of being infected with HIV. If  you choose to begin PrEP, you should first be tested for HIV. You should then be tested every 3 months for as long as you are taking PrEP. Pregnancy  If you are premenopausal and you may become pregnant, ask your health care provider about preconception counseling.  If you may become pregnant, take 400 to 800 micrograms (mcg) of folic acid every day.  If you want to prevent pregnancy, talk to your health care provider about birth control (contraception). Osteoporosis and menopause  Osteoporosis is a disease in which the bones lose minerals and strength with aging. This can result in serious bone fractures. Your risk for osteoporosis can be identified using a bone density scan.  If you are 35 years of age or older, or if you are at risk for osteoporosis and fractures, ask your health care provider if you should be screened.  Ask your health care provider whether you should take a calcium or vitamin D supplement to lower your risk for osteoporosis.  Menopause may have certain physical symptoms and risks.  Hormone replacement therapy may reduce some of these symptoms and risks. Talk to your health care provider about whether hormone replacement therapy is right for you. Follow these instructions at home:  Schedule regular health, dental, and eye exams.  Stay current with your immunizations.  Do not use any tobacco products including cigarettes, chewing tobacco, or electronic cigarettes.  If you are pregnant, do not drink alcohol.  If you are breastfeeding, limit how much and how often you drink alcohol.  Limit alcohol intake  to no more than 1 drink per day for nonpregnant women. One drink equals 12 ounces of beer, 5 ounces of wine, or 1 ounces of hard liquor.  Do not use street drugs.  Do not share needles.  Ask your health care provider for help if you need support or information about quitting drugs.  Tell your health care provider if you often feel depressed.  Tell your health care provider if you have ever been abused or do not feel safe at home. This information is not intended to replace advice given to you by your health care provider. Make sure you discuss any questions you have with your health care provider. Document Released: 06/17/2011 Document Revised: 05/09/2016 Document Reviewed: 09/05/2015 Elsevier Interactive Patient Education  [employer].

## 2017-12-05 LAB — CBC
HCT: 44 % (ref 36.0–46.0)
Hemoglobin: 14.2 g/dL (ref 12.0–15.0)
MCHC: 32.4 g/dL (ref 30.0–36.0)
MCV: 92.7 fl (ref 78.0–100.0)
Platelets: 358 10*3/uL (ref 150.0–400.0)
RBC: 4.75 Mil/uL (ref 3.87–5.11)
RDW: 13.7 % (ref 11.5–15.5)
WBC: 7.9 10*3/uL (ref 4.0–10.5)

## 2017-12-05 LAB — LIPID PANEL
CHOL/HDL RATIO: 4
CHOLESTEROL: 175 mg/dL (ref 0–200)
HDL: 44.3 mg/dL (ref >39.00)
LDL Cholesterol: 117 mg/dL — ABNORMAL HIGH (ref 0–99)
NonHDL: 130.63
TRIGLYCERIDES: 66 mg/dL (ref 0.0–149.0)
VLDL: 13.2 mg/dL (ref 0.0–40.0)

## 2017-12-05 LAB — TSH: TSH: 6.04 u[IU]/mL — ABNORMAL HIGH (ref 0.35–4.50)

## 2017-12-05 LAB — HEMOGLOBIN A1C: HEMOGLOBIN A1C: 5.4 % (ref 4.6–6.5)

## 2017-12-06 ENCOUNTER — Encounter: Payer: Self-pay | Admitting: Family Medicine

## 2017-12-06 MED ORDER — LEVOTHYROXINE SODIUM 25 MCG PO CAPS: 25.0000 ug | ORAL_CAPSULE | Freq: Every day | ORAL | 6 refills | Status: DC

## 2017-12-06 NOTE — Addendum Note (Signed)
Addended by: Abbe AmsterdamOPLAND, Dynastee C on: 12/06/2017 07:15 AM   Modules accepted: Orders

## 2018-02-19 ENCOUNTER — Ambulatory Visit: Payer: 59 | Admitting: Physician Assistant

## 2018-02-19 ENCOUNTER — Encounter: Payer: Self-pay | Admitting: Physician Assistant

## 2018-02-19 ENCOUNTER — Other Ambulatory Visit: Payer: Self-pay

## 2018-02-19 VITALS — BP 118/70 | HR 98 | Temp 98.9°F | Resp 17 | Ht 68.0 in | Wt 330.6 lb

## 2018-02-19 DIAGNOSIS — K529 Noninfective gastroenteritis and colitis, unspecified: Secondary | ICD-10-CM

## 2018-02-19 DIAGNOSIS — R197 Diarrhea, unspecified: Secondary | ICD-10-CM

## 2018-02-19 NOTE — Progress Notes (Signed)
   Cassandra CopaJessica L Balow  MRN: [id number] DOB: 07-16-1981  Subjective:   Cassandra Levy is a 37 y.o. female who presents for evaluation of diarrhea 3 times per day and generalized abdominal discomfort. Has intermittent mild nausea. Symptoms have been present for 1 days. Patient denies acholic stools, blood in stool, constipation, dark urine, dysuria, fever, heartburn, hematemesis, hematuria, and melena. Patient's oral intake has been normal. Patient's urine output has been adequate.Has tried crackers and sprite which seem to calm her stomach down.  Other contacts with similar symptoms include: husband and coworkers. Patient denies recent travel history. Patient has not had recent ingestion of possible contaminated food, toxic plants, or inappropriate medications/poisons. Denies smoking and alcohol use. PSH of cholecystectomy.   Review of Systems  Constitutional: Negative for diaphoresis, fatigue and unexpected weight change.  Gastrointestinal: Negative for constipation.  Neurological: Negative for dizziness and light-headedness.    Patient Active Problem List   Diagnosis Date Noted  . Morbid obesity (HCC) 10/03/2015  . Hyperthyroidism 01/20/2014  . PCOS (polycystic ovarian syndrome) 12/13/2013  . Lower extremity edema 12/03/2013  . Obesity 10/08/2012  . Goiter 06/27/2012  . Constipation 06/27/2012    Current Outpatient Medications on File Prior to Visit  Medication Sig Dispense Refill  . EPINEPHrine (EPIPEN) 0.3 mg/0.3 mL SOAJ injection Inject 0.3 mLs (0.3 mg total) into the muscle once. 2 Device 2  . hydrochlorothiazide (HYDRODIURIL) 25 MG tablet Take 1 tablet (25 mg total) by mouth daily. 90 tablet 3  . metroNIDAZOLE (METROGEL) 1 % gel Apply topically daily. Use on your face as needed for irritation 45 g 0  . norethindrone (AYGESTIN) 5 MG tablet Take 10 mg by mouth daily.      No current facility-administered medications on file prior to visit.     Allergies  Allergen Reactions    . Amoxicillin Swelling    Tightness in throat  . Bee Venom   . Cleocin [Clindamycin Hcl]     hives     Objective:  BP 118/70 (BP Location: Left Arm, Patient Position: Sitting, Cuff Size: Large)   Pulse 98   Temp 98.9 F (37.2 C) (Oral)   Resp 17   Ht 5\' 8"  (1.727 m)   Wt (!) 330 lb 9.6 oz (150 kg)   LMP 01/25/2018   SpO2 97%   BMI 50.27 kg/m   Physical Exam  Constitutional: She is oriented to person, place, and time and well-developed, well-nourished, and in no distress.  HENT:  Head: Normocephalic and atraumatic.  Mouth/Throat: Uvula is midline, oropharynx is clear and moist and mucous membranes are normal.  Eyes: Conjunctivae are normal.  Neck: Normal range of motion.  Pulmonary/Chest: Effort normal.  Abdominal: Soft. Normal appearance and bowel sounds are normal. There is generalized tenderness (mild).  Neurological: She is alert and oriented to person, place, and time. Gait normal.  Skin: Skin is warm and dry.  Psychiatric: Affect normal.  Vitals reviewed.   Assessment and Plan :  1. Diarrhea, unspecified type 2. Gastroenteritis Hx and PE findings consistent with gastroenteritis. Patient is overall well-appearing, no distress.  Vitals stable.  She is tolerating oral fluids and food. Recommended BRAT diet. Discussed oral rehydration, reintroduction of solid foods, signs of dehydration. Return or go to emergency department if worsening symptoms, blood or bile, signs of dehydration, diarrhea lasting longer than 5 days or any new concerns.  Benjiman CoreBrittany Lynnsie Linders PA-C  [facility]  [facility] 02/19/2018 1:56 PM

## 2018-02-19 NOTE — Patient Instructions (Addendum)
Your symptoms are most consistent with gastroenteritis.  I recommend eating a bland diet for the next few days.  Advance your diet as tolerated.  Continue drinking lots of fluids. You may want to try ginger ale and pedialyte. If no improvement in the next 5 days, please seek care. Please bring back to our office.  If any of your symptoms worsen or you develop new concerning symptoms, please return office.   Food Choices to Help Relieve Diarrhea, Adult When you have diarrhea, the foods you eat and your eating habits are very important. Choosing the right foods and drinks can help:  Relieve diarrhea.  Replace lost fluids and nutrients.  Prevent dehydration.  What general guidelines should I follow? Relieving diarrhea  Choose foods with less than 2 g or .07 oz. of fiber per serving.  Limit fats to less than 8 tsp (38 g or 1.34 oz.) a day.  Avoid the following: ? Foods and beverages sweetened with high-fructose corn syrup, honey, or sugar alcohols such as xylitol, sorbitol, and mannitol. ? Foods that contain a lot of fat or sugar. ? Fried, greasy, or spicy foods. ? High-fiber grains, breads, and cereals. ? Raw fruits and vegetables.  Eat foods that are rich in probiotics. These foods include dairy products such as yogurt and fermented milk products. They help increase healthy bacteria in the stomach and intestines (gastrointestinal tract, or GI tract).  If you have lactose intolerance, avoid dairy products. These may make your diarrhea worse.  Take medicine to help stop diarrhea (antidiarrheal medicine) only as told by your health care provider. Replacing nutrients  Eat small meals or snacks every 3-4 hours.  Eat bland foods, such as white rice, toast, or baked potato, until your diarrhea starts to get better. Gradually reintroduce nutrient-rich foods as tolerated or as told by your health care provider. This includes: ? Well-cooked protein foods. ? Peeled, seeded, and soft-cooked  fruits and vegetables. ? Low-fat dairy products.  Take vitamin and mineral supplements as told by your health care provider. Preventing dehydration   Start by sipping water or a special solution to prevent dehydration (oral rehydration solution, ORS). Urine that is clear or pale yellow means that you are getting enough fluid.  Try to drink at least 8-10 cups of fluid each day to help replace lost fluids.  You may add other liquids in addition to water, such as clear juice or decaffeinated sports drinks, as tolerated or as told by your health care provider.  Avoid drinks with caffeine, such as coffee, tea, or soft drinks.  Avoid alcohol. What foods are recommended? The items listed may not be a complete list. Talk with your health care provider about what dietary choices are best for you. Grains White rice. White, [place], or pita breads (fresh or toasted), including plain rolls, buns, or bagels. White pasta. Saltine, soda, or graham crackers. Pretzels. Low-fiber cereal. Cooked cereals made with water (such as cornmeal, farina, or cream cereals). Plain muffins. Matzo. Melba toast. Zwieback. Vegetables Potatoes (without the skin). Most well-cooked and canned vegetables without skins or seeds. Tender lettuce. Fruits Apple sauce. Fruits canned in juice. Cooked apricots, cherries, grapefruit, peaches, pears, or plums. Fresh bananas and cantaloupe. Meats and other protein foods Baked or boiled chicken. Eggs. Tofu. Fish. Seafood. Smooth nut butters. Ground or well-cooked tender beef, ham, veal, lamb, pork, or poultry. Dairy Plain yogurt, kefir, and unsweetened liquid yogurt. Lactose-free milk, buttermilk, skim milk, or soy milk. Low-fat or nonfat hard cheese. Beverages Water. Low-calorie  sports drinks. Fruit juices without pulp. Strained tomato and vegetable juices. Decaffeinated teas. Sugar-free beverages not sweetened with sugar alcohols. Oral rehydration solutions, if approved by your health  care provider. Seasoning and other foods Bouillon, broth, or soups made from recommended foods. What foods are not recommended? The items listed may not be a complete list. Talk with your health care provider about what dietary choices are best for you. Grains Whole grain, whole wheat, bran, or rye breads, rolls, pastas, and crackers. Wild or brown rice. Whole grain or bran cereals. Barley. Oats and oatmeal. Corn tortillas or taco shells. Granola. Popcorn. Vegetables Raw vegetables. Fried vegetables. Cabbage, broccoli, Brussels sprouts, artichokes, baked beans, beet greens, corn, kale, legumes, peas, sweet potatoes, and yams. Potato skins. Cooked spinach and cabbage. Fruits Dried fruit, including raisins and dates. Raw fruits. Stewed or dried prunes. Canned fruits with syrup. Meat and other protein foods Fried or fatty meats. Deli meats. Chunky nut butters. Nuts and seeds. Beans and lentils. Tomasa BlaseBacon. Hot dogs. Sausage. Dairy High-fat cheeses. Whole milk, chocolate milk, and beverages made with milk, such as milk shakes. Half-and-half. Cream. sour cream. Ice cream. Beverages Caffeinated beverages (such as coffee, tea, soda, or energy drinks). Alcoholic beverages. Fruit juices with pulp. Prune juice. Soft drinks sweetened with high-fructose corn syrup or sugar alcohols. High-calorie sports drinks. Fats and oils Butter. Cream sauces. Margarine. Salad oils. Plain salad dressings. Olives. Avocados. Mayonnaise. Sweets and desserts Sweet rolls, doughnuts, and sweet breads. Sugar-free desserts sweetened with sugar alcohols such as xylitol and sorbitol. Seasoning and other foods Honey. Hot sauce. Chili powder. Gravy. Cream-based or milk-based soups. Pancakes and waffles. Summary  When you have diarrhea, the foods you eat and your eating habits are very important.  Make sure you get at least 8-10 cups of fluid each day, or enough to keep your urine clear or pale yellow.  Eat bland foods and  gradually reintroduce healthy, nutrient-rich foods as tolerated, or as told by your health care provider.  Avoid high-fiber, fried, greasy, or spicy foods. This information is not intended to replace advice given to you by your health care provider. Make sure you discuss any questions you have with your health care provider. Document Released: 02/22/2004 Document Revised: 11/29/2016 Document Reviewed: 11/29/2016 Elsevier Interactive Patient Education  2018 [employer]  IF you received an x-ray today, you will receive an invoice from [facility]. Please contact [facility] at [phone] with questions or concerns regarding your invoice.   IF you received labwork today, you will receive an invoice from [facility]. Please contact [facility] at [phone] with questions or concerns regarding your invoice.   Our billing staff will not be able to assist you with questions regarding bills from these companies.  You will be contacted with the lab results as soon as they are available. The fastest way to get your results is to activate your My Chart account. Instructions are located on the last page of this paperwork. If you have not heard from us regarding the results in 2 weeks, please contact this office.

## 2018-04-27 ENCOUNTER — Encounter: Payer: Self-pay | Admitting: Physician Assistant

## 2018-04-27 ENCOUNTER — Other Ambulatory Visit: Payer: Self-pay

## 2018-04-27 ENCOUNTER — Ambulatory Visit: Payer: 59 | Admitting: Physician Assistant

## 2018-04-27 VITALS — BP 136/78 | HR 86 | Temp 98.4°F | Resp 18 | Ht 68.74 in | Wt 339.0 lb

## 2018-04-27 DIAGNOSIS — J069 Acute upper respiratory infection, unspecified: Secondary | ICD-10-CM

## 2018-04-27 MED ORDER — OXYMETAZOLINE HCL 0.05 % NA SOLN
1.0000 | Freq: Two times a day (BID) | NASAL | 0 refills | Status: DC
Start: 2018-04-27 — End: 2018-05-21

## 2018-04-27 MED ORDER — BENZONATATE 100 MG PO CAPS: 100.0000 mg | ORAL_CAPSULE | Freq: Three times a day (TID) | ORAL | 0 refills | Status: DC | PRN

## 2018-04-27 MED ORDER — GUAIFENESIN ER 1200 MG PO TB12: 1.0000 | ORAL_TABLET | Freq: Two times a day (BID) | ORAL | 0 refills | Status: DC | PRN

## 2018-04-27 MED ORDER — HYDROCODONE-HOMATROPINE 5-1.5 MG/5ML PO SYRP: 5.0000 mL | ORAL_SOLUTION | Freq: Three times a day (TID) | ORAL | 0 refills | Status: DC | PRN

## 2018-04-27 NOTE — Patient Instructions (Addendum)
- We will treat this as a respiratory viral infection.  - I recommend you rest, drink plenty of fluids, eat light meals including soups.  - You may use cough syrup at night for your cough and sore throat, Tessalon pearls during the day to stop cough and mucinex to help bring things up. Be aware that cough syrup can definitely make you drowsy and sleepy so do not drive or operate any heavy machinery if it is affecting you during the day.  - You may also use Tylenol or ibuprofen over-the-counter for your sore throat. Tea recipe for sore throat: boil water, add 2 inches shaved ginger root, steep 15 minutes, add juice from 2 full lemons, and 2 tbsp honey. -You can use afrin for nasal congestion, but discontinue after 3 days as it can make things worse if you use I for longer than that. You may want to also try over the counter nasal saline rinses for congestion.  - Please let me know if you are not seeing any improvement or get worse in 5-7 days.  Upper Respiratory Infection, Adult Most upper respiratory infections (URIs) are caused by a virus. A URI affects the nose, throat, and upper air passages. The most common type of URI is often called "the common cold." Follow these instructions at home:  Take medicines only as told by your doctor.  Gargle warm saltwater or take cough drops to comfort your throat as told by your doctor.  Use a warm mist humidifier or inhale steam from a shower to increase air moisture. This may make it easier to breathe.  Drink enough fluid to keep your pee (urine) clear or pale yellow.  Eat soups and other clear broths.  Have a healthy diet.  Rest as needed.  Go back to work when your fever is gone or your doctor says it is okay. ? You may need to stay home longer to avoid giving your URI to others. ? You can also wear a face mask and wash your hands often to prevent spread of the virus.  Use your inhaler more if you have asthma.  Do not use any tobacco  products, including cigarettes, chewing tobacco, or electronic cigarettes. If you need help quitting, ask your doctor. Contact a doctor if:  You are getting worse, not better.  Your symptoms are not helped by medicine.  You have chills.  You are getting more short of breath.  You have brown or red mucus.  You have yellow or brown discharge from your nose.  You have pain in your face, especially when you bend forward.  You have a fever.  You have puffy (swollen) neck glands.  You have pain while swallowing.  You have white areas in the back of your throat. Get help right away if:  You have very bad or constant: ? Headache. ? Ear pain. ? Pain in your forehead, behind your eyes, and over your cheekbones (sinus pain). ? Chest pain.  You have long-lasting (chronic) lung disease and any of the following: ? Wheezing. ? Long-lasting cough. ? Coughing up blood. ? A change in your usual mucus.  You have a stiff neck.  You have changes in your: ? Vision. ? Hearing. ? Thinking. ? Mood. This information is not intended to replace advice given to you by your health care provider. Make sure you discuss any questions you have with your health care provider. Document Released: 05/20/2008 Document Revised: 08/04/2016 Document Reviewed: 03/09/2014 Elsevier Interactive Patient Education  2018 Elsevier Inc.     IF you received an x-ray today, you will receive an invoice from [facility]. Please contact [facility] at [phone] with questions or concerns regarding your invoice.   IF you received labwork today, you will receive an invoice from [place]. Please contact [facility] at [phone] with questions or concerns regarding your invoice.   Our billing staff will not be able to assist you with questions regarding bills from these companies.  You will be contacted with the lab results as soon as they are available. The fastest way to get your results is to  activate your My Chart account. Instructions are located on the last page of this paperwork. If you have not heard from [place] regarding the results in 2 weeks, please contact this office.

## 2018-04-27 NOTE — Progress Notes (Signed)
MRN: [id number] DOB: [birth year]  Subjective:   Cassandra Levy is a 37 y.o. female presenting for chief complaint of Cough (X 2-3 days) and Sore Throat (X 3 days) .  Reports  3 day  history of illness. Started out with sore throat. Then developed runny nose, hoarse voice, nasal congestion, sinus pressure,  and productive cough. Throat is so raw it hurts when she coughs. Cannot sleep due to the cough.  Has tried mucinex and claritin with no full relief. Denies fever, sinus pain, ear pain, wheezing, shortness of breath, chest pain and myalgia, nausea, vomiting, abdominal pain and diarrhea. Has not had sick contact with anyone. Has history of seasonal allergies, no history of asthma. Patient has not had flu shot this season. Denies smoking.  No recent travel. Denies any other aggravating or relieving factors, no other questions or concerns.  Karalyn has a current medication list which includes the following prescription(s): epinephrine, hydrochlorothiazide, metronidazole, and norethindrone. Also is allergic to amoxicillin; bee venom; and cleocin [clindamycin hcl].  Debbera  has a past medical history of Allergy, Anemia, and Thyroid disease. Also  has a past surgical history that includes Colposcopy and Cholecystectomy.   Objective:   Vitals: BP 136/78 (BP Location: Left Arm, Patient Position: Sitting, Cuff Size: Large)   Pulse 86   Temp 98.4 F (36.9 C) (Oral)   Resp 18   Ht 5' 8.74" (1.746 m)   Wt (!) 339 lb (153.8 kg)   LMP 04/24/2018   SpO2 97%   BMI 50.44 kg/m   Physical Exam  Constitutional: She is oriented to person, place, and time. She appears well-developed and well-nourished. No distress.  HENT:  Head: Normocephalic and atraumatic.  Right Ear: Tympanic membrane, external ear and ear canal normal.  Left Ear: Tympanic membrane, external ear and ear canal normal.  Nose: Mucosal edema (moderate bilaterally) present. Right sinus exhibits maxillary sinus tenderness (mild).  Right sinus exhibits no frontal sinus tenderness. Left sinus exhibits maxillary sinus tenderness (mild). Left sinus exhibits no frontal sinus tenderness.  Mouth/Throat: Uvula is midline and mucous membranes are normal. Posterior oropharyngeal erythema (cobblestoning noted) present. No posterior oropharyngeal edema or tonsillar abscesses. No tonsillar exudate.  Eyes: Conjunctivae are normal.  Neck: Normal range of motion.  Cardiovascular: Normal rate, regular rhythm, normal heart sounds and intact distal pulses.  Pulmonary/Chest: Effort normal and breath sounds normal. She has no decreased breath sounds. She has no wheezes. She has no rhonchi. She has no rales.  Lymphadenopathy:       Head (right side): No submental, no submandibular, no tonsillar, no preauricular, no posterior auricular and no occipital adenopathy present.       Head (left side): No submental, no submandibular, no tonsillar, no preauricular, no posterior auricular and no occipital adenopathy present.    She has no cervical adenopathy.       Right: No supraclavicular adenopathy present.       Left: No supraclavicular adenopathy present.  Neurological: She is alert and oriented to person, place, and time.  Skin: Skin is warm and dry.  Psychiatric: She has a normal mood and affect.  Vitals reviewed.   No results found for this or any previous visit (from the past 24 hour(s)).  Assessment and Plan :  1. Acute upper respiratory infection - Likely viral in etiology d/t reassuring physical exam findings and vitals. - Advised supportive care, offered symptomatic relief. - Return to clinic if symptoms worsen or fail to improve in 5-7  days, otherwise return to clinic as needed. - Guaifenesin (MUCINEX MAXIMUM STRENGTH) 1200 MG TB12; Take 1 tablet (1,200 mg total) by mouth every 12 (twelve) hours as needed.  Dispense: 14 tablet; Refill: 0 - benzonatate (TESSALON) 100 MG capsule; Take 1-2 capsules (100-200 mg total) by mouth 3 (three)  times daily as needed for cough.  Dispense: 40 capsule; Refill: 0 - oxymetazoline (AFRIN 12 HOUR) 0.05 % nasal spray; Place 1 spray into both nostrils 2 (two) times daily.  Dispense: 30 mL; Refill: 0 - HYDROcodone-homatropine (HYCODAN) 5-1.5 MG/5ML syrup; Take 5 mLs by mouth every 8 (eight) hours as needed for cough.  Dispense: 75 mL; Refill: 0  Benjiman Core, PA-C  [facility] at [facility] 04/27/2018 9:29 AM

## 2018-04-29 ENCOUNTER — Other Ambulatory Visit: Payer: Self-pay

## 2018-04-29 ENCOUNTER — Encounter: Payer: Self-pay | Admitting: Physician Assistant

## 2018-04-29 ENCOUNTER — Ambulatory Visit: Payer: 59 | Admitting: Physician Assistant

## 2018-04-29 VITALS — BP 142/83 | HR 75 | Temp 98.2°F | Resp 18 | Ht 69.09 in | Wt 336.6 lb

## 2018-04-29 DIAGNOSIS — J0100 Acute maxillary sinusitis, unspecified: Secondary | ICD-10-CM | POA: Diagnosis not present

## 2018-04-29 DIAGNOSIS — R030 Elevated blood-pressure reading, without diagnosis of hypertension: Secondary | ICD-10-CM

## 2018-04-29 MED ORDER — FLUTICASONE PROPIONATE 50 MCG/ACT NA SUSP: 2.0000 | Freq: Every day | NASAL | 6 refills | Status: DC

## 2018-04-29 MED ORDER — DOXYCYCLINE HYCLATE 100 MG PO CAPS: 100.0000 mg | ORAL_CAPSULE | Freq: Two times a day (BID) | ORAL | 0 refills | Status: DC

## 2018-04-29 NOTE — Patient Instructions (Addendum)
I recommend continuing symptomatic treatment.  Add Flonase to your daily regimen.  Use Flonase directly after Afrin.  I would also switch of your Claritin to a Zyrtec or Allegra daily.  You may attempt to use phenylephrine but it can increase your blood pressure so monitor your blood pressure at home make sure you are taking your bp med daily.  If no improvement during the weekend, please pick up printed prescription intake.  This will cover both sinus and lung bacterial etiology.  While taking Doxycycline:  -Do not drink milk or take iron supplements, multivitamins, calcium supplements, antacids, laxatives within 2 hours before or after taking doxycycline. -Avoid direct exposure to sunlight or tanning beds. Doxycycline can make you sunburn more easily. Wear protective clothing and use sunscreen (SPF 30 or higher) when you are outdoors. -Antibiotic medicines can cause diarrhea, which may be a sign of a new infection. If you have diarrhea that is watery or bloody, stop taking this medicine and seek medical care.     Sinusitis, Adult Sinusitis is soreness and inflammation of your sinuses. Sinuses are hollow spaces in the bones around your face. They are located:  Around your eyes.  In the middle of your forehead.  Behind your nose.  In your cheekbones.  Your sinuses and nasal passages are lined with a stringy fluid (mucus). Mucus normally drains out of your sinuses. When your nasal tissues get inflamed or swollen, the mucus can get trapped or blocked so air cannot flow through your sinuses. This lets bacteria, viruses, and funguses grow, and that leads to infection. Follow these instructions at home: Medicines  Take, use, or apply over-the-counter and prescription medicines only as told by your doctor. These may include nasal sprays.  If you were prescribed an antibiotic medicine, take it as told by your doctor. Do not stop taking the antibiotic even if you start to feel  better. Hydrate and Humidify  Drink enough water to keep your pee (urine) clear or pale yellow.  Use a cool mist humidifier to keep the humidity level in your home above 50%.  Breathe in steam for 10-15 minutes, 3-4 times a day or as told by your doctor. You can do this in the bathroom while a hot shower is running.  Try not to spend time in cool or dry air. Rest  Rest as much as possible.  Sleep with your head raised (elevated).  Make sure to get enough sleep each night. General instructions  Put a warm, moist washcloth on your face 3-4 times a day or as told by your doctor. This will help with discomfort.  Wash your hands often with soap and water. If there is no soap and water, use hand sanitizer.  Do not smoke. Avoid being around people who are smoking (secondhand smoke).  Keep all follow-up visits as told by your doctor. This is important. Contact a doctor if:  You have a fever.  Your symptoms get worse.  Your symptoms do not get better within 10 days. Get help right away if:  You have a very bad headache.  You cannot stop throwing up (vomiting).  You have pain or swelling around your face or eyes.  You have trouble seeing.  You feel confused.  Your neck is stiff.  You have trouble breathing. This information is not intended to replace advice given to you by your health care provider. Make sure you discuss any questions you have with your health care provider. Document Released: 05/20/2008 Document Revised:  07/28/2016 Document Reviewed: 09/27/2015 Elsevier Interactive Patient Education  2018 [employer].    IF you received an x-ray today, you will receive an invoice from [facility]. Please contact [facility] at [phone] with questions or concerns regarding your invoice.   IF you received labwork today, you will receive an invoice from [place]. Please contact [facility] at [phone] with questions or concerns regarding your  invoice.   Our billing staff will not be able to assist you with questions regarding bills from these companies.  You will be contacted with the lab results as soon as they are available. The fastest way to get your results is to activate your My Chart account. Instructions are located on the last page of this paperwork. If you have not heard from [place] regarding the results in 2 weeks, please contact this office.

## 2018-04-29 NOTE — Progress Notes (Signed)
MRN: [id number] DOB: Jun 15, 1981  Subjective:   Cassandra Levy is a 37 y.o. female presenting for chief complaint of Cough (follow up from monday appt ) .  Patient initially seen on 04/27/2018 for acute URI and treated symptomatically.  She is not fully better by now.  She is still having dry hacking cough, runny nose, hoarse voice, nasal congestion, sinus pressure, and new teeth and facial pain.  Has had reported fevers over the past couple days (T-max 100). Mucinex is helping developing cough syrup is helping her sleep.  Does not feel any relief with Claritin.  Afrin helps a little bit but then return.  Denies ear pain, wheezing, shortness of breath, chest pain, body aches, nausea, vomiting, abdominal pain diarrhea. Has history of seasonal allergies, no history of asthma. Patient has not had flu shot this season. Denies smoking.  No recent travel. Of note, one of her close friends just passed away this week.This has been very stressful for her. Notes they are like family and everyone is just shocked because he died suddenly and was apparently healthy.   Review of Systems  Constitutional: Positive for malaise/fatigue. Negative for chills and diaphoresis.  Cardiovascular: Negative for palpitations and leg swelling.  Gastrointestinal: Negative for abdominal pain.  Genitourinary: Negative for hematuria.  Neurological: Negative for dizziness, tingling and headaches.     Cassandra Levy has a current medication list which includes the following prescription(s): benzonatate, epinephrine, guaifenesin, hydrochlorothiazide, hydrocodone-homatropine, metronidazole, norethindrone, and oxymetazoline. Also is allergic to amoxicillin; bee venom; and cleocin [clindamycin hcl].  Cassandra Levy  has a past medical history of Allergy, Anemia, and Thyroid disease. Also  has a past surgical history that includes Colposcopy and Cholecystectomy.   Objective:   Vitals: BP (!) 142/83 (BP Location: Right Arm, Patient  Position: Sitting, Cuff Size: Large)   Pulse 75   Temp 98.2 F (36.8 C) (Oral)   Resp 18   Ht 5' 9.09" (1.755 m)   Wt (!) 336 lb 9.6 oz (152.7 kg)   LMP 04/24/2018   SpO2 96%   BMI 49.57 kg/m   Physical Exam  Constitutional: She is oriented to person, place, and time. She appears well-developed and well-nourished. No distress.  HENT:  Head: Normocephalic and atraumatic.  Right Ear: Tympanic membrane, external ear and ear canal normal.  Left Ear: Tympanic membrane, external ear and ear canal normal.  Nose: Mucosal edema (moderate bilaterally) present. Right sinus exhibits maxillary sinus tenderness. Right sinus exhibits no frontal sinus tenderness. Left sinus exhibits maxillary sinus tenderness. Left sinus exhibits no frontal sinus tenderness.  Mouth/Throat: Uvula is midline and mucous membranes are normal. Posterior oropharyngeal erythema present. No posterior oropharyngeal edema or tonsillar abscesses. No tonsillar exudate.  Eyes: Conjunctivae are normal.  Neck: Normal range of motion.  Cardiovascular: Normal rate, regular rhythm, normal heart sounds and intact distal pulses.  Pulmonary/Chest: Effort normal and breath sounds normal. She has no decreased breath sounds. She has no wheezes. She has no rhonchi. She has no rales.  Lymphadenopathy:       Head (right side): No submental, no submandibular, no tonsillar, no preauricular, no posterior auricular and no occipital adenopathy present.       Head (left side): No submental, no submandibular, no tonsillar, no preauricular, no posterior auricular and no occipital adenopathy present.    She has no cervical adenopathy.       Right: No supraclavicular adenopathy present.       Left: No supraclavicular adenopathy present.  Neurological: She  is alert and oriented to person, place, and time.  Skin: Skin is warm and dry.  Psychiatric: She has a normal mood and affect.  Vitals reviewed.   No results found for this or any previous visit  (from the past 24 hour(s)).  Assessment and Plan :  1. Acute non-recurrent maxillary sinusitis - Pt educated that this is still likely viral in etiology d/t reassuring physical exam findings/ - Advised continuing supportive care. Will add flonase to regimen.  - Given printed Rx for doxy to use if no improvement over the weekend. Return to clinic if symptoms worsen or as needed.  - doxycycline (VIBRAMYCIN) 100 MG capsule; Take 1 capsule (100 mg total) by mouth 2 (two) times daily.  Dispense: 20 capsule; Refill: 0 - fluticasone (FLONASE) 50 MCG/ACT nasal spray; Place 2 sprays into both nostrils daily.  Dispense: 16 g; Refill: 6  2. Elevated blood pressure reading Asymptomatic. She has not taken bp meds today. Instructed to check bp outside of office over the next couple of weeks. Return if consistently >140/90. Given strict ED precautions.    Benjiman Core, PA-C  Primary Care at [facility] 04/29/2018 9:28 AM

## 2018-05-01 ENCOUNTER — Encounter: Payer: Self-pay | Admitting: Family Medicine

## 2018-05-06 ENCOUNTER — Encounter: Payer: Self-pay | Admitting: Family Medicine

## 2018-05-21 ENCOUNTER — Ambulatory Visit (INDEPENDENT_AMBULATORY_CARE_PROVIDER_SITE_OTHER): Payer: 59

## 2018-05-21 ENCOUNTER — Encounter: Payer: Self-pay | Admitting: Physician Assistant

## 2018-05-21 ENCOUNTER — Other Ambulatory Visit: Payer: Self-pay

## 2018-05-21 ENCOUNTER — Ambulatory Visit: Payer: 59 | Admitting: Physician Assistant

## 2018-05-21 VITALS — BP 110/82 | HR 100 | Temp 99.3°F | Resp 16 | Ht 68.0 in | Wt 337.0 lb

## 2018-05-21 DIAGNOSIS — J209 Acute bronchitis, unspecified: Secondary | ICD-10-CM

## 2018-05-21 DIAGNOSIS — R05 Cough: Secondary | ICD-10-CM

## 2018-05-21 DIAGNOSIS — R059 Cough, unspecified: Secondary | ICD-10-CM

## 2018-05-21 DIAGNOSIS — T162XXA Foreign body in left ear, initial encounter: Secondary | ICD-10-CM

## 2018-05-21 LAB — POCT CBC
Granulocyte percent: 75 %G (ref 37–80)
HEMATOCRIT: 43.1 % (ref 37.7–47.9)
HEMOGLOBIN: 14.1 g/dL (ref 12.2–16.2)
LYMPH, POC: 2.4 (ref 0.6–3.4)
MCH, POC: 29 pg (ref 27–31.2)
MCHC: 32.7 g/dL (ref 31.8–35.4)
MCV: 88.7 fL (ref 80–97)
MID (cbc): 0.2 (ref 0–0.9)
MPV: 8.5 fL (ref 0–99.8)
PLATELET COUNT, POC: 313 10*3/uL (ref 142–424)
POC GRANULOCYTE: 7.7 — AB (ref 2–6.9)
POC LYMPH %: 23 % (ref 10–50)
POC MID %: 2 %M (ref 0–12)
RBC: 4.86 M/uL (ref 4.04–5.48)
RDW, POC: 13.5 %
WBC: 10.3 10*3/uL — AB (ref 4.6–10.2)

## 2018-05-21 MED ORDER — AZITHROMYCIN 250 MG PO TABS: ORAL_TABLET | ORAL | 0 refills | Status: DC

## 2018-05-21 NOTE — Progress Notes (Signed)
azit    MRN: [id number] DOB: 11-25-1981  Subjective:   Cassandra Levy is a 37 y.o. female presenting for chief complaint of Cough (per pt "feeling alot better than she was, cough is still lingering, doesn't seem to be affecting breathing, husband is better". she stated that an employee at work has been diagnosed with viral bronchitis) .  Reports 3 week history of dry hacking cough. Has some nasal congestion and PND. Seen 04/29/18 for sinusitis. Was tx symptomatically and given Rx for abx if she did have improvement. Notes she did not have to fill and take the abx. Sinus issues resolved but cough has lingered.  Has stopped taking flonase, tessalon perles, and mucinex. Denies fever, ear pain, sore throat, difficulty swallowing, wheezing, shortness of breath, chest tightness and chest pain, nausea, vomiting, abdominal pain and diarrhea. Has had sick contact with coworker with bronchitis. Has history of seasonal allergies, no history of asthma. Denies smoking. Denies any other aggravating or relieving factors, no other questions or concerns.  Cassandra Levy has a current medication list which includes the following prescription(s): epinephrine, hydrochlorothiazide, metronidazole, norethindrone, and fluticasone. Also is allergic to amoxicillin; bee venom; and cleocin [clindamycin hcl].  Cassandra Levy  has a past medical history of Allergy, Anemia, and Thyroid disease. Also  has a past surgical history that includes Colposcopy and Cholecystectomy.   Objective:   Vitals: BP 110/82 (BP Location: Left Arm, Patient Position: Sitting, Cuff Size: Large)   Pulse 100   Temp 99.3 F (37.4 C) (Oral)   Resp 16   Ht 5\' 8"  (1.727 m)   Wt (!) 337 lb (152.9 kg)   LMP 04/24/2018   SpO2 97%   BMI 51.24 kg/m   Physical Exam  Constitutional: She is oriented to person, place, and time. She appears well-developed and well-nourished. No distress.  HENT:  Head: Normocephalic and atraumatic.  Right Ear: Tympanic membrane,  external ear and ear canal normal.  Left Ear: Tympanic membrane and external ear normal. A foreign body (piece of coarse black hair noted in ear canal overlying TM) is present.  Nose: Mucosal edema present. Right sinus exhibits no maxillary sinus tenderness and no frontal sinus tenderness. Left sinus exhibits no maxillary sinus tenderness and no frontal sinus tenderness.  Mouth/Throat: Uvula is midline and mucous membranes are normal. Posterior oropharyngeal erythema present. No posterior oropharyngeal edema or tonsillar abscesses. No tonsillar exudate.  Eyes: Conjunctivae are normal.  Neck: Normal range of motion.  Cardiovascular: Normal rate, regular rhythm, normal heart sounds and intact distal pulses.  Pulmonary/Chest: Effort normal and breath sounds normal. She has no decreased breath sounds. She has no wheezes. She has no rhonchi. She has no rales.  Lymphadenopathy:       Head (right side): No submental, no submandibular, no tonsillar, no preauricular, no posterior auricular and no occipital adenopathy present.       Head (left side): No submental, no submandibular, no tonsillar, no preauricular, no posterior auricular and no occipital adenopathy present.    She has no cervical adenopathy.       Right: No supraclavicular adenopathy present.       Left: No supraclavicular adenopathy present.  Neurological: She is alert and oriented to person, place, and time.  Skin: Skin is warm and dry.  Psychiatric: She has a normal mood and affect.  Vitals reviewed.   Post ear lavage, canals are clear.   Results for orders placed or performed in visit on 05/21/18 (from the past 24 hour(s))  POCT CBC  Status: Abnormal   Collection Time: 05/21/18  4:34 PM  Result Value Ref Range   WBC 10.3 (A) 4.6 - 10.2 K/uL   Lymph, poc 2.4 0.6 - 3.4   POC LYMPH PERCENT 23.0 10 - 50 %L   MID (cbc) 0.2 0 - 0.9   POC MID % 2.0 0 - 12 %M   POC Granulocyte 7.7 (A) 2 - 6.9   Granulocyte percent 75.0 37 - 80 %G    RBC 4.86 4.04 - 5.48 M/uL   Hemoglobin 14.1 12.2 - 16.2 g/dL   HCT, POC 16.143.1 09.637.7 - 47.9 %   MCV 88.7 80 - 97 fL   MCH, POC 29.0 27 - 31.2 pg   MCHC 32.7 31.8 - 35.4 g/dL   RDW, POC 04.513.5 %   Platelet Count, POC 313 142 - 424 K/uL   MPV 8.5 0 - 99.8 fL   Dg Chest 2 View  Result Date: 05/21/2018 CLINICAL DATA:  Cough x3 weeks EXAM: CHEST - 2 VIEW COMPARISON:  05/27/2015 FINDINGS: Lungs are clear.  No pleural effusion or pneumothorax. The heart is normal in size. Visualized osseous structures are within normal limits. IMPRESSION: Normal chest radiographs. Electronically Signed   By: Charline BillsSriyesh  Krishnan M.D.   On: 05/21/2018 16:51    Assessment and Plan :  1. Acute bronchitis, unspecified organism Due to duration of cough with no improvement with sx tx and elevated WBC with left shift, will cover for underlying atypical bacterial etiology at this time. CXR normal.  Advised to continue mucinex, tessalon perles, and daily flonase. Return to clinic if symptoms worsen, do not improve, or as needed.  - azithromycin (ZITHROMAX) 250 MG tablet; Take 2 tabs PO x 1 dose, then 1 tab PO QD x 4 days  Dispense: 6 tablet; Refill: 0  2. Cough - DG Chest 2 View; Future - POCT CBC  3. Foreign body of left ear, initial encounter Hair consistent with pt's dogs hair. Successfully removed.  - Ear Lavage  Benjiman CoreBrittany Presten Joost, PA-C  Primary Care at [facility] 05/21/2018 4:57 PM

## 2018-05-21 NOTE — Patient Instructions (Addendum)
Your chest xray was normal but your white blood cell count is mildly elevated. I recommend we treat for potential underlying bacterial infection with a zpack. Please take as prescribed. You can continue using mucinex and tessalon perles if if helps and definitely continue daily antihistamine and flonase. Thank you for letting me participate in your health and well being.  Tea recipe for cough: boil water, add 2 inches shaved ginger root, steep 15 minutes, add juice from 2 full lemons, and 2 tbsp honey.   Acute Bronchitis, Adult Acute bronchitis is when air tubes (bronchi) in the lungs suddenly get swollen. The condition can make it hard to breathe. It can also cause these symptoms:  A cough.  Coughing up clear, yellow, or green mucus.  Wheezing.  Chest congestion.  Shortness of breath.  A fever.  Body aches.  Chills.  A sore throat.  Follow these instructions at home: Medicines  Take over-the-counter and prescription medicines only as told by your doctor.  If you were prescribed an antibiotic medicine, take it as told by your doctor. Do not stop taking the antibiotic even if you start to feel better. General instructions  Rest.  Drink enough fluids to keep your pee (urine) clear or pale yellow.  Avoid smoking and secondhand smoke. If you smoke and you need help quitting, ask your doctor. Quitting will help your lungs heal faster.  Use an inhaler, cool mist vaporizer, or humidifier as told by your doctor.  Keep all follow-up visits as told by your doctor. This is important. How is this prevented? To lower your risk of getting this condition again:  Wash your hands often with soap and water. If you cannot use soap and water, use hand sanitizer.  Avoid contact with people who have cold symptoms.  Try not to touch your hands to your mouth, nose, or eyes.  Make sure to get the flu shot every year.  Contact a doctor if:  Your symptoms do not get better in 2  weeks. Get help right away if:  You cough up blood.  You have chest pain.  You have very bad shortness of breath.  You become dehydrated.  You faint (pass out) or keep feeling like you are going to pass out.  You keep throwing up (vomiting).  You have a very bad headache.  Your fever or chills gets worse. This information is not intended to replace advice given to you by your health care provider. Make sure you discuss any questions you have with your health care provider. Document Released: 05/20/2008 Document Revised: 07/10/2016 Document Reviewed: 05/22/2016 Elsevier Interactive Patient Education  2018 [employer]    IF you received an x-ray today, you will receive an invoice from [facility]. Please contact [facility] at [phone] with questions or concerns regarding your invoice.   IF you received labwork today, you will receive an invoice from [facility]. Please contact [facility] at [phone] with questions or concerns regarding your invoice.   Our billing staff will not be able to assist you with questions regarding bills from these companies.  You will be contacted with the lab results as soon as they are available. The fastest way to get your results is to activate your My Chart account. Instructions are located on the last page of this paperwork. If you have not heard from us regarding the results in 2 weeks, please contact this office.

## 2018-05-22 ENCOUNTER — Encounter: Payer: Self-pay | Admitting: Physician Assistant

## 2018-06-17 DIAGNOSIS — Z0100 Encounter for examination of eyes and vision without abnormal findings: Secondary | ICD-10-CM | POA: Diagnosis not present

## 2018-11-03 ENCOUNTER — Ambulatory Visit: Payer: 59 | Admitting: Family

## 2018-11-03 ENCOUNTER — Encounter: Payer: Self-pay | Admitting: Family

## 2018-11-03 VITALS — BP 129/73 | HR 78 | Temp 98.8°F | Resp 18 | Ht 68.0 in | Wt 347.0 lb

## 2018-11-03 DIAGNOSIS — R109 Unspecified abdominal pain: Secondary | ICD-10-CM

## 2018-11-03 LAB — COMPREHENSIVE METABOLIC PANEL
ALBUMIN: 4 g/dL (ref 3.5–5.2)
ALT: 13 U/L (ref 0–35)
AST: 10 U/L (ref 0–37)
Alkaline Phosphatase: 82 U/L (ref 39–117)
BILIRUBIN TOTAL: 0.6 mg/dL (ref 0.2–1.2)
BUN: 12 mg/dL (ref 6–23)
CHLORIDE: 103 meq/L (ref 96–112)
CO2: 30 meq/L (ref 19–32)
CREATININE: 0.84 mg/dL (ref 0.40–1.20)
Calcium: 9.3 mg/dL (ref 8.4–10.5)
GFR: 80.93 mL/min (ref >60.00)
Glucose, Bld: 77 mg/dL (ref 70–99)
Potassium: 4.4 mEq/L (ref 3.5–5.1)
SODIUM: 139 meq/L (ref 135–145)
Total Protein: 6.8 g/dL (ref 6.0–8.3)

## 2018-11-03 LAB — CBC WITH DIFFERENTIAL/PLATELET
BASOS ABS: 0.1 10*3/uL (ref 0.0–0.1)
Basophils Relative: 0.8 % (ref 0.0–3.0)
EOS ABS: 0.3 10*3/uL (ref 0.0–0.7)
Eosinophils Relative: 3.7 % (ref 0.0–5.0)
HEMATOCRIT: 42.7 % (ref 36.0–46.0)
Hemoglobin: 14.1 g/dL (ref 12.0–15.0)
LYMPHS PCT: 22.2 % (ref 12.0–46.0)
Lymphs Abs: 1.9 10*3/uL (ref 0.7–4.0)
MCHC: 33.1 g/dL (ref 30.0–36.0)
MCV: 89.9 fl (ref 78.0–100.0)
MONOS PCT: 8 % (ref 3.0–12.0)
Monocytes Absolute: 0.7 10*3/uL (ref 0.1–1.0)
NEUTROS ABS: 5.5 10*3/uL (ref 1.4–7.7)
NEUTROS PCT: 65.3 % (ref 43.0–77.0)
PLATELETS: 373 10*3/uL (ref 150.0–400.0)
RBC: 4.75 Mil/uL (ref 3.87–5.11)
RDW: 13.8 % (ref 11.5–15.5)
WBC: 8.4 10*3/uL (ref 4.0–10.5)

## 2018-11-03 LAB — LIPASE: Lipase: 6 U/L — ABNORMAL LOW (ref 11.0–59.0)

## 2018-11-03 MED ORDER — ONDANSETRON HCL 4 MG PO TABS: 4.0000 mg | ORAL_TABLET | Freq: Three times a day (TID) | ORAL | 0 refills | Status: DC | PRN

## 2018-11-03 NOTE — Patient Instructions (Addendum)
Please complete lab work prior to leaving.  You may use zofran as needed for nausea. Call if new/worsening symptoms or if symptoms are not improved in 1-2 days. Send me a message via mychart tomorrow to let me know how you are feeling.

## 2018-11-03 NOTE — Progress Notes (Signed)
Subjective:    Patient ID: Cassandra Levy, female    DOB: 08-27-1981, 37 y.o.   MRN: [id number]  HPI  Patient is a 37 yr old female with hx of anemia, allergies, and hypothyroidism who presents today with chief complaint of abdominal pain.  She reports symptoms began yesterday AM.  Reports pain is in the upper abdomen and is crampy in nature. Reports that last night she had dinner, went to bed and woke with "really bad cramping, nausea, no vomiting. Reports that she had soft BM's but not watery.  Increased frequency of stools. Denies fever.  She is not eating today but she has been drinking water and sprite.  She is s/p cholecystectomy. Reports that she has had several sick coworkers, mother-in-law was sick with a stomach virus and she had exposure to her on Sunday.  Spent nearly every day with her over the weekend.    Review of Systems Past Medical History:  Diagnosis Date  . Allergy   . Anemia   . Thyroid disease      Social History   Socioeconomic History  . Marital status: Married    Spouse name: Not on file  . Number of children: 0  . Years of education: Not on file  . Highest education level: Not on file  Occupational History  . Not on file  Social Needs  . Financial resource strain: Not on file  . Food insecurity:    Worry: Not on file    Inability: Not on file  . Transportation needs:    Medical: Not on file    Non-medical: Not on file  Tobacco Use  . Smoking status: Never Smoker  . Smokeless tobacco: Never Used  Substance and Sexual Activity  . Alcohol use: No  . Drug use: No  . Sexual activity: Yes  Lifestyle  . Physical activity:    Days per week: Not on file    Minutes per session: Not on file  . Stress: Not on file  Relationships  . Social connections:    Talks on phone: Not on file    Gets together: Not on file    Attends religious service: Not on file    Active member of club or organization: Not on file    Attends meetings of clubs or  organizations: Not on file    Relationship status: Not on file  . Intimate partner violence:    Fear of current or ex partner: Not on file    Emotionally abused: Not on file    Physically abused: Not on file    Forced sexual activity: Not on file  Other Topics Concern  . Not on file  Social History Narrative  . Not on file    Past Surgical History:  Procedure Laterality Date  . CHOLECYSTECTOMY    . COLPOSCOPY      Family History  Problem Relation Age of Onset  . Stroke Mother   . Leukemia Other     Allergies  Allergen Reactions  . Amoxicillin Swelling    Tightness in throat  . Bee Venom   . Cleocin [Clindamycin Hcl]     hives    Current Outpatient Medications on File Prior to Visit  Medication Sig Dispense Refill  . EPINEPHrine (EPIPEN) 0.3 mg/0.3 mL SOAJ injection Inject 0.3 mLs (0.3 mg total) into the muscle once. 2 Device 2  . hydrochlorothiazide (HYDRODIURIL) 25 MG tablet Take 1 tablet (25 mg total) by mouth daily. 90 tablet 3  .  metroNIDAZOLE (METROGEL) 1 % gel Apply topically daily. Use on your face as needed for irritation 45 g 0  . norethindrone (AYGESTIN) 5 MG tablet Take 10 mg by mouth daily.      No current facility-administered medications on file prior to visit.     BP 129/73 (BP Location: Right Arm, Patient Position: Sitting, Cuff Size: Large)   Pulse 78   Temp 98.8 F (37.1 C) (Oral)   Resp 18   Ht 5\' 8"  (1.727 m)   Wt (!) 347 lb (157.4 kg)   SpO2 97%   BMI 52.76 kg/m       Objective:   Physical Exam  Constitutional: She appears well-developed and well-nourished.  Cardiovascular: Normal rate, regular rhythm and normal heart sounds.  No murmur heard. Pulmonary/Chest: Effort normal and breath sounds normal. No respiratory distress. She has no wheezes.  Abdominal: Normal appearance. She exhibits no distension. Bowel sounds are increased. There is tenderness in the left upper quadrant. There is no rigidity and no guarding.  Psychiatric: She  has a normal mood and affect. Her behavior is normal. Judgment and thought content normal.          Assessment & Plan:  Abdominal pain-I suspect that her symptoms represent acute viral gastroenteritis.  Especially given her known exposure.  I have advised her to focus on adequate hydration and bland diet diet as tolerated.  I have given her a prescription for Zofran to use as needed for nausea.  Will obtain a complete metabolic panel, lipase to assess for possible pancreatitis, and a CBC.  If symptoms worsen will need CT abdomen to further evaluate.  She is advised to let us know if symptoms worsen or fail to improve.  I also asked her to send [place] a message tomorrow morning letting us know how she is feeling.  I have written her out of work today and tomorrow.

## 2018-11-04 ENCOUNTER — Encounter: Payer: Self-pay | Admitting: Family

## 2018-12-06 NOTE — Progress Notes (Addendum)
[facility] [address], [address] [place], [place] [id number] [phone] [phone]  Date:  12/07/2018   Name:  Cassandra CopaJessica L Levy   DOB:  [birth year]   MRN:  [id number]  PCP:  Pearline Cablesopland, Sundai C, MD    Chief Complaint: Annual Exam (sees gyn, declines flu shot)   History of Present Illness  Cassandra Levy is a 37 y.o. very pleasant female patient who presents with the following:  History of obesity, hyperthyroidism, PCOS, bilateral lower extremity edema which we manage with HCTZ Here today for a CPE I know this pt very well although I have not seen her in in about a year. It looks like she goes over to [place] when she is sick generally   Pap: GYN Labs: she had a CMP and CBC in November but needs lipids and A1c, TSH.  She is not fasting today  I had rx a thyroid replacement for her last year but I am not sure if she is taking this  Immun: offered a flu shot.  She took it in the past but it seemed to cause vomiting , she declines today  Mammo: 2 of her great aunts were dx with breast cancer in their 9180s.   She is interested in doing a baseline screening, and I ordered this for her today  Wt Readings from Last 3 Encounters:  12/07/18 (!) 350 lb (158.8 kg)  11/03/18 (!) 347 lb (157.4 kg)  05/21/18 (!) 337 lb (152.9 kg)  she had been staying at about 330 lbs with diet and exercise this year, but has gained some weight over the holidays  She and her husband are thinking about starting a family, but she would like to lose a few pounds for the start trying.  Patient Active Problem List   Diagnosis Date Noted  . Morbid obesity (HCC) 10/03/2015  . Hyperthyroidism 01/20/2014  . PCOS (polycystic ovarian syndrome) 12/13/2013  . Lower extremity edema 12/03/2013  . Obesity 10/08/2012  . Goiter 06/27/2012  . Constipation 06/27/2012    Past Medical History:  Diagnosis Date  . Allergy   . Anemia   . Thyroid disease     Past Surgical  History:  Procedure Laterality Date  . CHOLECYSTECTOMY    . COLPOSCOPY      Social History   Tobacco Use  . Smoking status: Never Smoker  . Smokeless tobacco: Never Used  Substance Use Topics  . Alcohol use: No  . Drug use: No    Family History  Problem Relation Age of Onset  . Stroke Mother   . Leukemia Other     Allergies  Allergen Reactions  . Amoxicillin Swelling    Tightness in throat  . Bee Venom   . Cleocin [Clindamycin Hcl]     hives    Medication list has been reviewed and updated.  Current Outpatient Medications on File Prior to Visit  Medication Sig Dispense Refill  . EPINEPHrine (EPIPEN) 0.3 mg/0.3 mL SOAJ injection Inject 0.3 mLs (0.3 mg total) into the muscle once. 2 Device 2  . hydrochlorothiazide (HYDRODIURIL) 25 MG tablet Take 1 tablet (25 mg total) by mouth daily. 90 tablet 3  . metroNIDAZOLE (METROGEL) 1 % gel Apply topically daily. Use on your face as needed for irritation 45 g 0  . norethindrone (AYGESTIN) 5 MG tablet Take 10 mg by mouth daily.      No current facility-administered medications on file prior to visit.  Review of Systems:  As per HPI- otherwise negative. She may take 1.5 of her HCTZ daily if her swelling is worse than normla    Physical Examination: Vitals:   12/07/18 1520  BP: 132/90  Pulse: (!) 102  Resp: 18  SpO2: 99%   Vitals:   12/07/18 1520  Weight: (!) 350 lb (158.8 kg)  Height: 5\' 8"  (1.727 m)   Body mass index is 53.22 kg/m. Ideal Body Weight: Weight in (lb) to have BMI = 25: 164.1  GEN: WDWN, NAD, Non-toxic, A & O x 3, obese, looks well  HEENT: Atraumatic, Normocephalic. Neck supple. No masses, No LAD. Bilateral TM wnl, oropharynx normal.  PEERL,EOMI.   Ears and Nose: No external deformity. CV: RRR, No M/G/R. No JVD. No thrill. No extra heart sounds. PULM: CTA B, no wheezes, crackles, rhonchi. No retractions. No resp. distress. No accessory muscle use. ABD: S, NT, ND. No rebound. No HSM. EXTR: No  c/c/e.  Bilateral calves are large, but do not appear edematous NEURO Normal gait.  PSYCH: Normally interactive. Conversant. Not depressed or anxious appearing lm demeanor.    Assessment and Plan: Physical exam  Abnormal TSH - Plan: TSH  Screening for diabetes mellitus - Plan: Hemoglobin A1c  Screening for hyperlipidemia - Plan: Lipid panel  Screening for breast cancer - Plan: MM 3D SCREEN BREAST BILATERAL  Lower extremity edema - Plan: hydrochlorothiazide (HYDRODIURIL) 25 MG tablet  Facial erythema - Plan: metroNIDAZOLE (METROGEL) 1 % gel   Here today for a physical, doing well except for some weight gain.  She does plan to work on losing this again. Labs plan as above. Refill hydrochlorothiazide, she takes 25 mg.  She may take 1+1/2 pills if her swelling is more pronounced. Refilled MetroGel. Ordered mammogram for baseline screening.  She will make sure this is covered by insurance prior to getting the test  Will plan further follow- up pending labs.   Signed Abbe AmsterdamJessica , MD  Received her labs 12/24, message to pt  Results for orders placed or performed in visit on 12/07/18  Hemoglobin A1c  Result Value Ref Range   Hgb A1c MFr Bld 5.2 4.6 - 6.5 %  Lipid panel  Result Value Ref Range   Cholesterol 193 0 - 200 mg/dL   Triglycerides 16.196.0 0.0 - 149.0 mg/dL   HDL 09.6043.60 >45.40>39.00 mg/dL   VLDL 98.119.2 0.0 - 19.140.0 mg/dL   LDL Cholesterol 478131 (H) 0 - 99 mg/dL   Total CHOL/HDL Ratio 4    NonHDL 149.70   TSH  Result Value Ref Range   TSH 7.32 (H) 0.35 - 4.50 uIU/mL    Your A1c- average blood sugar- is normal Cholesterol looks good Your TSH is high- this means your body is asking for more thyroid hormone as your thyroid gland is underactive.  I think you were on thyroid medication; did you run out?   We should start you back on this- please reply so I know you got this message and we will make a plan.  Replacing your thyroid hormone will also make it a lot easier for you to  lose weight

## 2018-12-07 ENCOUNTER — Encounter: Payer: Self-pay | Admitting: Family Medicine

## 2018-12-07 ENCOUNTER — Other Ambulatory Visit: Payer: Self-pay | Admitting: Family Medicine

## 2018-12-07 ENCOUNTER — Ambulatory Visit (INDEPENDENT_AMBULATORY_CARE_PROVIDER_SITE_OTHER): Payer: 59 | Admitting: Family Medicine

## 2018-12-07 VITALS — BP 120/78 | HR 90 | Resp 18 | Ht 68.0 in | Wt 350.0 lb

## 2018-12-07 DIAGNOSIS — Z1239 Encounter for other screening for malignant neoplasm of breast: Secondary | ICD-10-CM

## 2018-12-07 DIAGNOSIS — R7989 Other specified abnormal findings of blood chemistry: Secondary | ICD-10-CM | POA: Diagnosis not present

## 2018-12-07 DIAGNOSIS — Z0000 Encounter for general adult medical examination without abnormal findings: Secondary | ICD-10-CM

## 2018-12-07 DIAGNOSIS — L539 Erythematous condition, unspecified: Secondary | ICD-10-CM

## 2018-12-07 DIAGNOSIS — R600 Localized edema: Secondary | ICD-10-CM

## 2018-12-07 DIAGNOSIS — Z131 Encounter for screening for diabetes mellitus: Secondary | ICD-10-CM | POA: Diagnosis not present

## 2018-12-07 DIAGNOSIS — Z13220 Encounter for screening for lipoid disorders: Secondary | ICD-10-CM

## 2018-12-07 MED ORDER — METRONIDAZOLE 1 % EX GEL: Freq: Every day | CUTANEOUS | 0 refills | Status: DC

## 2018-12-07 MED ORDER — HYDROCHLOROTHIAZIDE 25 MG PO TABS: 25.0000 mg | ORAL_TABLET | Freq: Every day | ORAL | 3 refills | Status: DC

## 2018-12-07 NOTE — Patient Instructions (Signed)
It was a pleasure to see you today, hope you have a wonderful holiday season. Continue to take hydrochlorothiazide for swelling as needed. I also refilled your metronidazole for your face I will be in touch your labs ASAP Continue to work on your weight through diet and exercise I ordered a screening mammogram for you today, you can stop at the imaging department on the ground floor on your way out and schedule at your convenience. However, you might make sure that your insurance does cover a screening at your age prior to having it done  Health Maintenance, Female Adopting a healthy lifestyle and getting preventive care can go a long way to promote health and wellness. Talk with your health care provider about what schedule of regular examinations is right for you. This is a good chance for you to check in with your provider about disease prevention and staying healthy. In between checkups, there are plenty of things you can do on your own. Experts have done a lot of research about which lifestyle changes and preventive measures are most likely to keep you healthy. Ask your health care provider for more information. Weight and diet Eat a healthy diet  Be sure to include plenty of vegetables, fruits, low-fat dairy products, and lean protein.  Do not eat a lot of foods high in solid fats, added sugars, or salt.  Get regular exercise. This is one of the most important things you can do for your health. ? Most adults should exercise for at least 150 minutes each week. The exercise should increase your heart rate and make you sweat (moderate-intensity exercise). ? Most adults should also do strengthening exercises at least twice a week. This is in addition to the moderate-intensity exercise. Maintain a healthy weight  Body mass index (BMI) is a measurement that can be used to identify possible weight problems. It estimates body fat based on height and weight. Your health care provider can help  determine your BMI and help you achieve or maintain a healthy weight.  For females 67 years of age and older: ? A BMI below 18.5 is considered underweight. ? A BMI of 18.5 to 24.9 is normal. ? A BMI of 25 to 29.9 is considered overweight. ? A BMI of 30 and above is considered obese. Watch levels of cholesterol and blood lipids  You should start having your blood tested for lipids and cholesterol at 37 years of age, then have this test every 5 years.  You may need to have your cholesterol levels checked more often if: ? Your lipid or cholesterol levels are high. ? You are older than 37 years of age. ? You are at high risk for heart disease. Cancer screening Lung Cancer  Lung cancer screening is recommended for adults 27-48 years old who are at high risk for lung cancer because of a history of smoking.  A yearly low-dose CT scan of the lungs is recommended for people who: ? Currently smoke. ? Have quit within the past 15 years. ? Have at least a 30-pack-year history of smoking. A pack year is smoking an average of one pack of cigarettes a day for 1 year.  Yearly screening should continue until it has been 15 years since you quit.  Yearly screening should stop if you develop a health problem that would prevent you from having lung cancer treatment. Breast Cancer  Practice breast self-awareness. This means understanding how your breasts normally appear and feel.  It also means doing regular  breast self-exams. Let your health care provider know about any changes, no matter how small.  If you are in your 20s or 30s, you should have a clinical breast exam (CBE) by a health care provider every 1-3 years as part of a regular health exam.  If you are 38 or older, have a CBE every year. Also consider having a breast X-ray (mammogram) every year.  If you have a family history of breast cancer, talk to your health care provider about genetic screening.  If you are at high risk for breast  cancer, talk to your health care provider about having an MRI and a mammogram every year.  Breast cancer gene (BRCA) assessment is recommended for women who have family members with BRCA-related cancers. BRCA-related cancers include: ? Breast. ? Ovarian. ? Tubal. ? Peritoneal cancers.  Results of the assessment will determine the need for genetic counseling and BRCA1 and BRCA2 testing. Cervical Cancer Your health care provider may recommend that you be screened regularly for cancer of the pelvic organs (ovaries, uterus, and vagina). This screening involves a pelvic examination, including checking for microscopic changes to the surface of your cervix (Pap test). You may be encouraged to have this screening done every 3 years, beginning at age 3.  For women ages 35-65, health care providers may recommend pelvic exams and Pap testing every 3 years, or they may recommend the Pap and pelvic exam, combined with testing for human papilloma virus (HPV), every 5 years. Some types of HPV increase your risk of cervical cancer. Testing for HPV may also be done on women of any age with unclear Pap test results.  Other health care providers may not recommend any screening for nonpregnant women who are considered low risk for pelvic cancer and who do not have symptoms. Ask your health care provider if a screening pelvic exam is right for you.  If you have had past treatment for cervical cancer or a condition that could lead to cancer, you need Pap tests and screening for cancer for at least 20 years after your treatment. If Pap tests have been discontinued, your risk factors (such as having a new sexual partner) need to be reassessed to determine if screening should resume. Some women have medical problems that increase the chance of getting cervical cancer. In these cases, your health care provider may recommend more frequent screening and Pap tests. Colorectal Cancer  This type of cancer can be detected and  often prevented.  Routine colorectal cancer screening usually begins at 37 years of age and continues through 37 years of age.  Your health care provider may recommend screening at an earlier age if you have risk factors for colon cancer.  Your health care provider may also recommend using home test kits to check for hidden blood in the stool.  A small camera at the end of a tube can be used to examine your colon directly (sigmoidoscopy or colonoscopy). This is done to check for the earliest forms of colorectal cancer.  Routine screening usually begins at age 37.  Direct examination of the colon should be repeated every 5-10 years through 37 years of age. However, you may need to be screened more often if early forms of precancerous polyps or small growths are found. Skin Cancer  Check your skin from head to toe regularly.  Tell your health care provider about any new moles or changes in moles, especially if there is a change in a mole's shape or color.  Also tell your health care provider if you have a mole that is larger than the size of a pencil eraser.  Always use sunscreen. Apply sunscreen liberally and repeatedly throughout the day.  Protect yourself by wearing long sleeves, pants, a wide-brimmed hat, and sunglasses whenever you are outside. Heart disease, diabetes, and high blood pressure  High blood pressure causes heart disease and increases the risk of stroke. High blood pressure is more likely to develop in: ? People who have blood pressure in the high end of the normal range (130-139/85-89 mm Hg). ? People who are overweight or obese. ? People who are African American.  If you are 44-79 years of age, have your blood pressure checked every 3-5 years. If you are 33 years of age or older, have your blood pressure checked every year. You should have your blood pressure measured twice-once when you are at a hospital or clinic, and once when you are not at a hospital or clinic.  Record the average of the two measurements. To check your blood pressure when you are not at a hospital or clinic, you can use: ? An automated blood pressure machine at a pharmacy. ? A home blood pressure monitor.  If you are between 67 years and 5 years old, ask your health care provider if you should take aspirin to prevent strokes.  Have regular diabetes screenings. This involves taking a blood sample to check your fasting blood sugar level. ? If you are at a normal weight and have a low risk for diabetes, have this test once every three years after 37 years of age. ? If you are overweight and have a high risk for diabetes, consider being tested at a younger age or more often. Preventing infection Hepatitis B  If you have a higher risk for hepatitis B, you should be screened for this virus. You are considered at high risk for hepatitis B if: ? You were born in a country where hepatitis B is common. Ask your health care provider which countries are considered high risk. ? Your parents were born in a high-risk country, and you have not been immunized against hepatitis B (hepatitis B vaccine). ? You have HIV or AIDS. ? You use needles to inject street drugs. ? You live with someone who has hepatitis B. ? You have had sex with someone who has hepatitis B. ? You get hemodialysis treatment. ? You take certain medicines for conditions, including cancer, organ transplantation, and autoimmune conditions. Hepatitis C  Blood testing is recommended for: ? Everyone born from [birth year] through 1965. ? Anyone with known risk factors for hepatitis C. Sexually transmitted infections (STIs)  You should be screened for sexually transmitted infections (STIs) including gonorrhea and chlamydia if: ? You are sexually active and are younger than 37 years of age. ? You are older than 37 years of age and your health care provider tells you that you are at risk for this type of infection. ? Your sexual activity  has changed since you were last screened and you are at an increased risk for chlamydia or gonorrhea. Ask your health care provider if you are at risk.  If you do not have HIV, but are at risk, it may be recommended that you take a prescription medicine daily to prevent HIV infection. This is called pre-exposure prophylaxis (PrEP). You are considered at risk if: ? You are sexually active and do not regularly use condoms or know the HIV status of your partner(s). ?  You take drugs by injection. ? You are sexually active with a partner who has HIV. Talk with your health care provider about whether you are at high risk of being infected with HIV. If you choose to begin PrEP, you should first be tested for HIV. You should then be tested every 3 months for as long as you are taking PrEP. Pregnancy  If you are premenopausal and you may become pregnant, ask your health care provider about preconception counseling.  If you may become pregnant, take 400 to 800 micrograms (mcg) of folic acid every day.  If you want to prevent pregnancy, talk to your health care provider about birth control (contraception). Osteoporosis and menopause  Osteoporosis is a disease in which the bones lose minerals and strength with aging. This can result in serious bone fractures. Your risk for osteoporosis can be identified using a bone density scan.  If you are 81 years of age or older, or if you are at risk for osteoporosis and fractures, ask your health care provider if you should be screened.  Ask your health care provider whether you should take a calcium or vitamin D supplement to lower your risk for osteoporosis.  Menopause may have certain physical symptoms and risks.  Hormone replacement therapy may reduce some of these symptoms and risks. Talk to your health care provider about whether hormone replacement therapy is right for you. Follow these instructions at home:  Schedule regular health, dental, and eye  exams.  Stay current with your immunizations.  Do not use any tobacco products including cigarettes, chewing tobacco, or electronic cigarettes.  If you are pregnant, do not drink alcohol.  If you are breastfeeding, limit how much and how often you drink alcohol.  Limit alcohol intake to no more than 1 drink per day for nonpregnant women. One drink equals 12 ounces of beer, 5 ounces of wine, or 1 ounces of hard liquor.  Do not use street drugs.  Do not share needles.  Ask your health care provider for help if you need support or information about quitting drugs.  Tell your health care provider if you often feel depressed.  Tell your health care provider if you have ever been abused or do not feel safe at home. This information is not intended to replace advice given to you by your health care provider. Make sure you discuss any questions you have with your health care provider. Document Released: 06/17/2011 Document Revised: 05/09/2016 Document Reviewed: 09/05/2015 Elsevier Interactive Patient Education  2019 [employer].

## 2018-12-08 ENCOUNTER — Encounter: Payer: Self-pay | Admitting: Family Medicine

## 2018-12-08 DIAGNOSIS — E039 Hypothyroidism, unspecified: Secondary | ICD-10-CM

## 2018-12-08 LAB — LIPID PANEL
Cholesterol: 193 mg/dL (ref 0–200)
HDL: 43.6 mg/dL (ref >39.00)
LDL CALC: 131 mg/dL — AB (ref 0–99)
NonHDL: 149.7
Total CHOL/HDL Ratio: 4
Triglycerides: 96 mg/dL (ref 0.0–149.0)
VLDL: 19.2 mg/dL (ref 0.0–40.0)

## 2018-12-08 LAB — TSH: TSH: 7.32 u[IU]/mL — ABNORMAL HIGH (ref 0.35–4.50)

## 2018-12-08 LAB — HEMOGLOBIN A1C: Hgb A1c MFr Bld: 5.2 % (ref 4.6–6.5)

## 2018-12-10 MED ORDER — METRONIDAZOLE 1 % EX GEL: Freq: Every day | CUTANEOUS | 0 refills | Status: DC

## 2018-12-10 NOTE — Addendum Note (Signed)
Addended by: Steve RattlerBLEVINS, BAILEY A on: 12/10/2018 08:34 AM   Modules accepted: Orders

## 2018-12-13 MED ORDER — LEVOTHYROXINE SODIUM 25 MCG PO TABS: 25.0000 ug | ORAL_TABLET | Freq: Every day | ORAL | 5 refills | Status: DC

## 2019-01-03 ENCOUNTER — Other Ambulatory Visit: Payer: Self-pay | Admitting: Family Medicine

## 2019-01-03 DIAGNOSIS — R600 Localized edema: Secondary | ICD-10-CM

## 2019-02-12 ENCOUNTER — Other Ambulatory Visit: Payer: Self-pay

## 2019-02-12 ENCOUNTER — Ambulatory Visit: Payer: 59 | Admitting: Family Medicine

## 2019-02-12 ENCOUNTER — Encounter: Payer: Self-pay | Admitting: Family Medicine

## 2019-02-12 VITALS — BP 124/86 | HR 72 | Temp 98.7°F | Ht 68.0 in | Wt 353.6 lb

## 2019-02-12 DIAGNOSIS — M25521 Pain in right elbow: Secondary | ICD-10-CM | POA: Diagnosis not present

## 2019-02-12 MED ORDER — IBUPROFEN 600 MG PO TABS: 600.0000 mg | ORAL_TABLET | Freq: Three times a day (TID) | ORAL | 0 refills | Status: DC | PRN

## 2019-02-12 NOTE — Progress Notes (Signed)
2/28/202011:21 AM  Cassandra Levy Jan 16, 1981, 38 y.o. female [id number]  Chief Complaint  Patient presents with  . right arm    woke up with it this morning. was gardening last night     HPI:   Patient is a 38 y.o. female with past medical history significant for hypothyrodism who presents today for right arm pain  Right handed Was doing lots of gardening yesterday However this morning had a really difficult time moving her arm Hurts from her elbow down, a stiff shoulder Feels better when elbow is bent Some swelling Took ibuprofen 400mg  this morning, a bit better Unable to go to work as she does lots of lifting   Fall Risk  02/12/2019 04/29/2018 04/27/2018 02/19/2018 07/22/2017  Falls in the past year? 0 No No No No  Number falls in past yr: 0 - - - -  Injury with Fall? 0 - - - -  Follow up Falls evaluation completed - - - -     Depression screen [facility] 2/9 02/12/2019 04/29/2018 04/27/2018  Decreased Interest 0 0 0  Down, Depressed, Hopeless 0 0 0  PHQ - 2 Score 0 0 0    Allergies  Allergen Reactions  . Amoxicillin Swelling    Tightness in throat  . Bee Venom   . Cleocin [Clindamycin Hcl]     hives    Prior to Admission medications   Medication Sig Start Date End Date Taking? Authorizing Provider  EPINEPHrine (EPIPEN) 0.3 mg/0.3 mL SOAJ injection Inject 0.3 mLs (0.3 mg total) into the muscle once. 12/09/13  Yes Copland, Gwenlyn Found, MD  hydrochlorothiazide (HYDRODIURIL) 25 MG tablet TAKE 1 TABLET BY MOUTH EVERY DAY 01/05/19  Yes Copland, Gwenlyn Found, MD  levothyroxine (SYNTHROID, LEVOTHROID) 25 MCG tablet Take 1 tablet (25 mcg total) by mouth daily before breakfast. 12/13/18  Yes Copland, Gwenlyn Found, MD  metroNIDAZOLE (METROGEL) 1 % gel Apply topically daily. Use on your face as needed for irritation 12/10/18  Yes Copland, Gwenlyn Found, MD  norethindrone (AYGESTIN) 5 MG tablet Take 10 mg by mouth daily.    Yes [provider]    Past Medical History:  Diagnosis Date   . Allergy   . Anemia   . Thyroid disease     Past Surgical History:  Procedure Laterality Date  . CHOLECYSTECTOMY    . COLPOSCOPY      Social History   Tobacco Use  . Smoking status: Never Smoker  . Smokeless tobacco: Never Used  Substance Use Topics  . Alcohol use: No    Family History  Problem Relation Age of Onset  . Stroke Mother   . Leukemia Other     ROS Per hpi  OBJECTIVE:  Blood pressure 124/86, pulse 72, temperature 98.7 F (37.1 C), temperature source Oral, height 5\' 8"  (1.727 m), weight (!) 353 lb 9.6 oz (160.4 kg), last menstrual period 01/25/2019, SpO2 98 %. Body mass index is 53.76 kg/m.   Physical Exam Vitals signs and nursing note reviewed.  Constitutional:      Appearance: She is well-developed.  HENT:     Head: Normocephalic and atraumatic.  Eyes:     General: No scleral icterus.    Conjunctiva/sclera: Conjunctivae normal.     Pupils: Pupils are equal, round, and reactive to light.  Neck:     Musculoskeletal: Neck supple.  Pulmonary:     Effort: Pulmonary effort is normal.  Musculoskeletal:     Right shoulder: She exhibits normal range of motion,  no swelling, no pain and normal pulse.     Right elbow: She exhibits swelling. She exhibits normal range of motion and no effusion. Tenderness found. Medial epicondyle and lateral epicondyle tenderness noted. No radial head and no olecranon process tenderness noted.  Skin:    General: Skin is warm and dry.  Neurological:     Mental Status: She is alert and oriented to person, place, and time.      ASSESSMENT and PLAN  1. Right elbow pain Discussed supportive measures. RICE therapy. Work excuse given  Other orders - ibuprofen (ADVIL,MOTRIN) 600 MG tablet; Take 1 tablet (600 mg total) by mouth every 8 (eight) hours as needed.  Return if symptoms worsen or fail to improve.    Myles Lipps, MD Primary Care at [facility] [address] [place], [place] [address] Ph.  [phone] Fax  [phone]

## 2019-02-12 NOTE — Patient Instructions (Signed)
° ° ° °  If you have lab work done today you will be contacted with your lab results within the next 2 weeks.  If you have not heard from us then please contact us. The fastest way to get your results is to [facility] for My Chart. ° ° °IF you received an x-ray today, you will receive an invoice from [facility]. Please contact [facility] at [phone] with questions or concerns regarding your invoice.  ° °IF you received labwork today, you will receive an invoice from [facility]. Please contact [facility] at [phone] with questions or concerns regarding your invoice.  ° °Our billing staff will not be able to assist you with questions regarding bills from these companies. ° °You will be contacted with the lab results as soon as they are available. The fastest way to get your results is to activate your My Chart account. Instructions are located on the last page of this paperwork. If you have not heard from us regarding the results in 2 weeks, please contact this office. °  ° ° ° °

## 2019-04-27 ENCOUNTER — Other Ambulatory Visit: Payer: Self-pay | Admitting: Physician Assistant

## 2019-04-27 DIAGNOSIS — J0100 Acute maxillary sinusitis, unspecified: Secondary | ICD-10-CM

## 2019-06-17 ENCOUNTER — Ambulatory Visit (INDEPENDENT_AMBULATORY_CARE_PROVIDER_SITE_OTHER): Payer: 59 | Admitting: Medical

## 2019-06-17 ENCOUNTER — Encounter: Payer: Self-pay | Admitting: Medical

## 2019-06-17 ENCOUNTER — Other Ambulatory Visit: Payer: Self-pay

## 2019-06-17 DIAGNOSIS — R110 Nausea: Secondary | ICD-10-CM

## 2019-06-17 DIAGNOSIS — R195 Other fecal abnormalities: Secondary | ICD-10-CM | POA: Diagnosis not present

## 2019-06-17 MED ORDER — ONDANSETRON 8 MG PO TBDP: 8.0000 mg | ORAL_TABLET | Freq: Three times a day (TID) | ORAL | 0 refills | Status: DC | PRN

## 2019-06-17 MED ORDER — FAMOTIDINE 20 MG PO TABS: 20.0000 mg | ORAL_TABLET | Freq: Every day | ORAL | 0 refills | Status: DC

## 2019-06-17 NOTE — Progress Notes (Signed)
Subjective:    Patient ID: Cassandra Levy, female    DOB: January 09, 1981, 38 y.o.   MRN: [id number]  HPI  Virtual Visit via Video Note  I connected with [name] on 06/17/19 at 11:20 AM EDT by a video enabled telemedicine application and verified that I am speaking with the correct person using two identifiers.  Location: Patient: home Provider: office.  Pt does not have bp or pulse. Also no thermometer. No fever.   I discussed the limitations of evaluation and management by telemedicine and the availability of in person appointments. The patient expressed understanding and agreed to proceed.  History of Present Illness:  Pt stats recently she has some nausea and excess gas. She states last night symptoms started. She had looser stools since this morning. 3 loose stools today.Loose stool but not diarrhea yet.  Pt states her symptoms occurred after dinner. She had mushroom and swiss hamburger.  No vomiting.   Mother in law had upset stomach as well. Ate food from same restaurant.   Pt has some mild rt side back pain when she palpates her back. She states feels like muscle. No urinary symptoms.  No fevers, no cough, no ha or diffuse myalgias.  LMP- just finished. First day Thursday 25 th.     Observations/Objective:  General-no acute distress, pleasant, oriented. Lungs- on inspection lungs appear unlabored. Neck- no tracheal deviation or jvd on inspection. Neuro- gross motor function appears intact.  Assessment and Plan: Probable early viral gastroenteritis vs possible early  food poisining.  Recommend rest hydrate, bland diet, immodium if stools become looser/diarrhea like, bland foods and zofran for nausea or vomiting.  If by early next week obvious worsening diarrhea notify me and in that event would get cbc, cmp and get gastro stool panel.  Did explain to pt if new symptoms such as cough, sob, diffuse myalgia, ha then would consider covid but presently I  believe favors above dx.   Work note given pending folllow up.  [name], PA-C  Follow Up Instructions:    I discussed the assessment and treatment plan with the patient. The patient was provided an opportunity to ask questions and all were answered. The patient agreed with the plan and demonstrated an understanding of the instructions.   The patient was advised to call back or seek an in-person evaluation if the symptoms worsen or if the condition fails to improve as anticipated.  I provided 15  minutes of non-face-to-face time during this encounter.   [name], PA-C   Assessment and Plan:  Probable early viral gastroenteritis vs possible early  food poisining.  Recommend rest hydrate, bland diet, immodium if stools become looser/diarrhea like, bland foods and zofran for nausea or vomiting.  If by early next week obvious worsening diarrhea notify me and in that event would get cbc, cmp and get gastro stool panel.  Did explain to pt if new symptoms such as cough, sob, diffuse myalgia, ha then would consider covid but presently I believe favors above dx.   Work note given pending folllow up.  [name], PA-C    Follow Up Instructions:    I discussed the assessment and treatment plan with the patient. The patient was provided an opportunity to ask questions and all were answered. The patient agreed with the plan and demonstrated an understanding of the instructions.   The patient was advised to call back or seek an in-person evaluation if the symptoms worsen or if the condition  fails to improve as anticipated.  I provided 15  minutes of non-face-to-face time during this encounter.   Cassandra RichtersEdward Olesya Wike, PA-C   Review of Systems  Constitutional: Positive for fatigue and fever. Negative for chills.       Mild fatigue.  Respiratory: Positive for cough. Negative for chest tightness, shortness of breath and wheezing.   Cardiovascular: Negative for chest pain  and palpitations.  Gastrointestinal: Negative for abdominal distention, abdominal pain, blood in stool, nausea and vomiting.       Loose stools just starting.  Genitourinary: Negative for difficulty urinating, flank pain, frequency, pelvic pain and vaginal pain.  Skin: Negative for rash.  Psychiatric/Behavioral: Negative for behavioral problems, confusion and dysphoric mood.   Past Medical History:  Diagnosis Date  . Allergy   . Anemia   . Thyroid disease      Social History   Socioeconomic History  . Marital status: Married    Spouse name: Not on file  . Number of children: 0  . Years of education: Not on file  . Highest education level: Not on file  Occupational History  . Not on file  Social Needs  . Financial resource strain: Not on file  . Food insecurity    Worry: Not on file    Inability: Not on file  . Transportation needs    Medical: Not on file    Non-medical: Not on file  Tobacco Use  . Smoking status: Never Smoker  . Smokeless tobacco: Never Used  Substance and Sexual Activity  . Alcohol use: No  . Drug use: No  . Sexual activity: Yes  Lifestyle  . Physical activity    Days per week: Not on file    Minutes per session: Not on file  . Stress: Not on file  Relationships  . Social Musicianconnections    Talks on phone: Not on file    Gets together: Not on file    Attends religious service: Not on file    Active member of club or organization: Not on file    Attends meetings of clubs or organizations: Not on file    Relationship status: Not on file  . Intimate partner violence    Fear of current or ex partner: Not on file    Emotionally abused: Not on file    Physically abused: Not on file    Forced sexual activity: Not on file  Other Topics Concern  . Not on file  Social History Narrative  . Not on file    Past Surgical History:  Procedure Laterality Date  . CHOLECYSTECTOMY    . COLPOSCOPY      Family History  Problem Relation Age of Onset  .  Stroke Mother   . Leukemia Other     Allergies  Allergen Reactions  . Amoxicillin Swelling    Tightness in throat  . Bee Venom   . Cleocin [Clindamycin Hcl]     hives    Current Outpatient Medications on File Prior to Visit  Medication Sig Dispense Refill  . EPINEPHrine (EPIPEN) 0.3 mg/0.3 mL SOAJ injection Inject 0.3 mLs (0.3 mg total) into the muscle once. 2 Device 2  . hydrochlorothiazide (HYDRODIURIL) 25 MG tablet TAKE 1 TABLET BY MOUTH EVERY DAY 30 tablet 11  . ibuprofen (ADVIL,MOTRIN) 600 MG tablet Take 1 tablet (600 mg total) by mouth every 8 (eight) hours as needed. 30 tablet 0  . levothyroxine (SYNTHROID, LEVOTHROID) 25 MCG tablet Take 1 tablet (25 mcg total) by  mouth daily before breakfast. 30 tablet 5  . metroNIDAZOLE (METROGEL) 1 % gel Apply topically daily. Use on your face as needed for irritation 60 g 0  . norethindrone (AYGESTIN) 5 MG tablet Take 10 mg by mouth daily.      No current facility-administered medications on file prior to visit.     There were no vitals taken for this visit.      Objective:   Physical Exam        Assessment & Plan:

## 2019-06-17 NOTE — Patient Instructions (Signed)
Probable early viral gastroenteritis vs possible early  food poisining.  Recommend rest hydrate, bland diet, immodium if stools become looser/diarrhea like, bland foods and zofran for nausea or vomiting.  If by early next week obvious worsening diarrhea notify me and in that event would get cbc, cmp and get gastro stool panel.  Did explain to pt if new symptoms such as cough, sob, diffuse myalgia, ha then would consider covid but presently I believe favors above dx.   Work note given pending folllow up.

## 2019-07-10 ENCOUNTER — Other Ambulatory Visit: Payer: Self-pay | Admitting: Medical

## 2019-09-23 ENCOUNTER — Other Ambulatory Visit: Payer: Self-pay | Admitting: Family Medicine

## 2019-09-23 DIAGNOSIS — E039 Hypothyroidism, unspecified: Secondary | ICD-10-CM

## 2019-11-15 ENCOUNTER — Other Ambulatory Visit: Payer: Self-pay | Admitting: Family Medicine

## 2019-11-15 DIAGNOSIS — E039 Hypothyroidism, unspecified: Secondary | ICD-10-CM

## 2019-12-13 ENCOUNTER — Encounter: Payer: 59 | Admitting: Family Medicine

## 2019-12-16 ENCOUNTER — Other Ambulatory Visit: Payer: Self-pay

## 2019-12-18 NOTE — Patient Instructions (Addendum)
It was good to see you again today, I will be in touch with your labs asap Tetanus given June of 2015 Your EKG today is normal Please let me know if you continue to have any chest pain, or if you start to have any pain with exercise  In that case we can certainly pursue a treadmill test for you   Health Maintenance, Female Adopting a healthy lifestyle and getting preventive care are important in promoting health and wellness. Ask your health care provider about:  The right schedule for you to have regular tests and exams.  Things you can do on your own to prevent diseases and keep yourself healthy. What should I know about diet, weight, and exercise? Eat a healthy diet   Eat a diet that includes plenty of vegetables, fruits, low-fat dairy products, and lean protein.  Do not eat a lot of foods that are high in solid fats, added sugars, or sodium. Maintain a healthy weight Body mass index (BMI) is used to identify weight problems. It estimates body fat based on height and weight. Your health care provider can help determine your BMI and help you achieve or maintain a healthy weight. Get regular exercise Get regular exercise. This is one of the most important things you can do for your health. Most adults should:  Exercise for at least 150 minutes each week. The exercise should increase your heart rate and make you sweat (moderate-intensity exercise).  Do strengthening exercises at least twice a week. This is in addition to the moderate-intensity exercise.  Spend less time sitting. Even light physical activity can be beneficial. Watch cholesterol and blood lipids Have your blood tested for lipids and cholesterol at 39 years of age, then have this test every 5 years. Have your cholesterol levels checked more often if:  Your lipid or cholesterol levels are high.  You are older than 39 years of age.  You are at high risk for heart disease. What should I know about cancer screening?  Depending on your health history and family history, you may need to have cancer screening at various ages. This may include screening for:  Breast cancer.  Cervical cancer.  Colorectal cancer.  Skin cancer.  Lung cancer. What should I know about heart disease, diabetes, and high blood pressure? Blood pressure and heart disease  High blood pressure causes heart disease and increases the risk of stroke. This is more likely to develop in people who have high blood pressure readings, are of African descent, or are overweight.  Have your blood pressure checked: ? Every 3-5 years if you are 67-63 years of age. ? Every year if you are 76 years old or older. Diabetes Have regular diabetes screenings. This checks your fasting blood sugar level. Have the screening done:  Once every three years after age 68 if you are at a normal weight and have a low risk for diabetes.  More often and at a younger age if you are overweight or have a high risk for diabetes. What should I know about preventing infection? Hepatitis B If you have a higher risk for hepatitis B, you should be screened for this virus. Talk with your health care provider to find out if you are at risk for hepatitis B infection. Hepatitis C Testing is recommended for:  Everyone born from [birth year] through 1965.  Anyone with known risk factors for hepatitis C. Sexually transmitted infections (STIs)  Get screened for STIs, including gonorrhea and chlamydia, if: ? You  are sexually active and are younger than 39 years of age. ? You are older than 39 years of age and your health care provider tells you that you are at risk for this type of infection. ? Your sexual activity has changed since you were last screened, and you are at increased risk for chlamydia or gonorrhea. Ask your health care provider if you are at risk.  Ask your health care provider about whether you are at high risk for HIV. Your health care provider may recommend a  prescription medicine to help prevent HIV infection. If you choose to take medicine to prevent HIV, you should first get tested for HIV. You should then be tested every 3 months for as long as you are taking the medicine. Pregnancy  If you are about to stop having your period (premenopausal) and you may become pregnant, seek counseling before you get pregnant.  Take 400 to 800 micrograms (mcg) of folic acid every day if you become pregnant.  Ask for birth control (contraception) if you want to prevent pregnancy. Osteoporosis and menopause Osteoporosis is a disease in which the bones lose minerals and strength with aging. This can result in bone fractures. If you are 57 years old or older, or if you are at risk for osteoporosis and fractures, ask your health care provider if you should:  Be screened for bone loss.  Take a calcium or vitamin D supplement to lower your risk of fractures.  Be given hormone replacement therapy (HRT) to treat symptoms of menopause. Follow these instructions at home: Lifestyle  Do not use any products that contain nicotine or tobacco, such as cigarettes, e-cigarettes, and chewing tobacco. If you need help quitting, ask your health care provider.  Do not use street drugs.  Do not share needles.  Ask your health care provider for help if you need support or information about quitting drugs. Alcohol use  Do not drink alcohol if: ? Your health care provider tells you not to drink. ? You are pregnant, may be pregnant, or are planning to become pregnant.  If you drink alcohol: ? Limit how much you use to 0-1 drink a day. ? Limit intake if you are breastfeeding.  Be aware of how much alcohol is in your drink. In the [place] one drink equals one 12 oz bottle of beer (355 mL), one 5 oz glass of wine (148 mL), or one 1 oz glass of hard liquor (44 mL). General instructions  Schedule regular health, dental, and eye exams.  Stay current with your vaccines.   Tell your health care provider if: ? You often feel depressed. ? You have ever been abused or do not feel safe at home. Summary  Adopting a healthy lifestyle and getting preventive care are important in promoting health and wellness.  Follow your health care provider's instructions about healthy diet, exercising, and getting tested or screened for diseases.  Follow your health care provider's instructions on monitoring your cholesterol and blood pressure. This information is not intended to replace advice given to you by your health care provider. Make sure you discuss any questions you have with your health care provider. Document Revised: 11/25/2018 Document Reviewed: 11/25/2018 Elsevier Patient Education  2020 [employer].
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
## 2019-12-20 ENCOUNTER — Ambulatory Visit (INDEPENDENT_AMBULATORY_CARE_PROVIDER_SITE_OTHER): Payer: 59 | Admitting: Family Medicine

## 2019-12-20 ENCOUNTER — Encounter: Payer: Self-pay | Admitting: Family Medicine

## 2019-12-20 ENCOUNTER — Other Ambulatory Visit: Payer: Self-pay

## 2019-12-20 VITALS — BP 126/80 | HR 78 | Temp 97.2°F | Resp 18 | Ht 68.0 in | Wt 358.0 lb

## 2019-12-20 DIAGNOSIS — E039 Hypothyroidism, unspecified: Secondary | ICD-10-CM

## 2019-12-20 DIAGNOSIS — Z13220 Encounter for screening for lipoid disorders: Secondary | ICD-10-CM

## 2019-12-20 DIAGNOSIS — R600 Localized edema: Secondary | ICD-10-CM

## 2019-12-20 DIAGNOSIS — Z131 Encounter for screening for diabetes mellitus: Secondary | ICD-10-CM | POA: Diagnosis not present

## 2019-12-20 DIAGNOSIS — R079 Chest pain, unspecified: Secondary | ICD-10-CM | POA: Diagnosis not present

## 2019-12-20 DIAGNOSIS — Z0000 Encounter for general adult medical examination without abnormal findings: Secondary | ICD-10-CM

## 2019-12-21 ENCOUNTER — Encounter: Payer: Self-pay | Admitting: Family Medicine

## 2019-12-21 LAB — COMPREHENSIVE METABOLIC PANEL
ALT: 21 U/L (ref 0–35)
AST: 15 U/L (ref 0–37)
Albumin: 3.9 g/dL (ref 3.5–5.2)
Alkaline Phosphatase: 86 U/L (ref 39–117)
BUN: 10 mg/dL (ref 6–23)
CO2: 29 mEq/L (ref 19–32)
Calcium: 9.1 mg/dL (ref 8.4–10.5)
Chloride: 100 mEq/L (ref 96–112)
Creatinine, Ser: 0.78 mg/dL (ref 0.40–1.20)
GFR: 82.44 mL/min (ref >60.00)
Glucose, Bld: 81 mg/dL (ref 70–99)
Potassium: 3.7 mEq/L (ref 3.5–5.1)
Sodium: 137 mEq/L (ref 135–145)
Total Bilirubin: 0.5 mg/dL (ref 0.2–1.2)
Total Protein: 6.8 g/dL (ref 6.0–8.3)

## 2019-12-21 LAB — LIPID PANEL
Cholesterol: 214 mg/dL — ABNORMAL HIGH (ref 0–200)
HDL: 45.8 mg/dL (ref >39.00)
LDL Cholesterol: 152 mg/dL — ABNORMAL HIGH (ref 0–99)
NonHDL: 168.09
Total CHOL/HDL Ratio: 5
Triglycerides: 79 mg/dL (ref 0.0–149.0)
VLDL: 15.8 mg/dL (ref 0.0–40.0)

## 2019-12-21 LAB — HEMOGLOBIN A1C: Hgb A1c MFr Bld: 5.3 % (ref 4.6–6.5)

## 2019-12-21 LAB — CBC
HCT: 43.6 % (ref 36.0–46.0)
Hemoglobin: 14.4 g/dL (ref 12.0–15.0)
MCHC: 32.9 g/dL (ref 30.0–36.0)
MCV: 90.2 fl (ref 78.0–100.0)
Platelets: 312 10*3/uL (ref 150.0–400.0)
RBC: 4.83 Mil/uL (ref 3.87–5.11)
RDW: 13.5 % (ref 11.5–15.5)
WBC: 8.7 10*3/uL (ref 4.0–10.5)

## 2019-12-21 LAB — TSH: TSH: 8.68 u[IU]/mL — ABNORMAL HIGH (ref 0.35–4.50)

## 2019-12-21 MED ORDER — LEVOTHYROXINE SODIUM 50 MCG PO TABS: 50.0000 ug | ORAL_TABLET | Freq: Every day | ORAL | 6 refills | Status: DC

## 2019-12-21 NOTE — Addendum Note (Signed)
Addended by: Abbe Amsterdam C on: 12/21/2019 05:44 PM   Modules accepted: Orders

## 2020-01-26 ENCOUNTER — Other Ambulatory Visit: Payer: Self-pay | Admitting: Family Medicine

## 2020-01-26 DIAGNOSIS — R600 Localized edema: Secondary | ICD-10-CM

## 2020-01-27 ENCOUNTER — Telehealth: Payer: Self-pay

## 2020-01-27 DIAGNOSIS — R600 Localized edema: Secondary | ICD-10-CM

## 2020-01-27 NOTE — Telephone Encounter (Signed)
Patient received medication.

## 2020-01-27 NOTE — Telephone Encounter (Signed)
Patient called in to see if Dr. Patsy Lager can send in a prescription for hydrochlorothiazide (HYDRODIURIL) 25 MG tablet [[id number]]    The patient is currently out of medication  Please follow up with the patient at [phone]   Thanks,

## 2020-02-18 ENCOUNTER — Other Ambulatory Visit: Payer: Self-pay

## 2020-02-21 ENCOUNTER — Other Ambulatory Visit: Payer: Self-pay

## 2020-02-21 ENCOUNTER — Encounter: Payer: Self-pay | Admitting: Family Medicine

## 2020-02-21 ENCOUNTER — Ambulatory Visit: Payer: 59 | Admitting: Family Medicine

## 2020-02-21 VITALS — BP 132/80 | HR 91 | Temp 97.0°F | Resp 17 | Ht 68.0 in | Wt 361.0 lb

## 2020-02-21 DIAGNOSIS — H9311 Tinnitus, right ear: Secondary | ICD-10-CM

## 2020-02-21 DIAGNOSIS — E039 Hypothyroidism, unspecified: Secondary | ICD-10-CM | POA: Diagnosis not present

## 2020-02-21 NOTE — Progress Notes (Addendum)
[facility] [address] [place], [place] [address] [phone]  Date:  02/21/2020   Name:  Cassandra Levy   DOB:  Apr 19, 1981   MRN:  [id number]  PCP:  Pearline Cables, MD    Chief Complaint: Tinnitus (right ear ringing, several days,dizziness)   History of Present Illness:  Cassandra Levy is a 39 y.o. very pleasant female patient who presents with the following:  Here today for follow-up visit of concern of ear ringing- right side Patient with history of obesity, hypothyroidism, PCOS, lower extremity edema Last seen by myself in January for routine physical She works with the [employer], and forensics/evidence  She is on hydrochlorothiazide for lower extremity edema-she is not able to use compression socks as they really do not fit her  HCTZ Synthroid Metformin  Her right ear started ringing suddenly this past Thursday- today is Monday.  She went to bed and it got even louder the next morning  The ringing did stop she thinks Saturday night or Sunday morning. It lessened gradually and then stopped Her ear feels itchy now- she tried a qtip to see if it might help She does not tend to get ear infections The left ear is ok The ear does not hurt, but it feels constantly congested  Occasionally she feels a little dizzy if she moves quickly; this sounds more consistent with BPPV.  The vertigo tends to be more momentary as opposed to persistent She does not notice any hearing loss  She is already using daily claritin for allergies   Lab Results  Component Value Date   HGBA1C 5.3 12/20/2019    Patient Active Problem List   Diagnosis Date Noted  . Morbid obesity (HCC) 10/03/2015  . Hyperthyroidism 01/20/2014  . PCOS (polycystic ovarian syndrome) 12/13/2013  . Lower extremity edema 12/03/2013  . Obesity 10/08/2012  . Goiter 06/27/2012  . Constipation 06/27/2012    Past Medical History:   Diagnosis Date  . Allergy   . Anemia   . Thyroid disease     Past Surgical History:  Procedure Laterality Date  . CHOLECYSTECTOMY    . COLPOSCOPY      Social History   Tobacco Use  . Smoking status: Never Smoker  . Smokeless tobacco: Never Used  Substance Use Topics  . Alcohol use: No  . Drug use: No    Family History  Problem Relation Age of Onset  . Stroke Mother   . Leukemia Other     Allergies  Allergen Reactions  . Amoxicillin Swelling    Tightness in throat  . Bee Venom   . Cleocin [Clindamycin Hcl]     hives    Medication list has been reviewed and updated.  Current Outpatient Medications on File Prior to Visit  Medication Sig Dispense Refill  . EPINEPHrine (EPIPEN) 0.3 mg/0.3 mL SOAJ injection Inject 0.3 mLs (0.3 mg total) into the muscle once. 2 Device 2  . hydrochlorothiazide (HYDRODIURIL) 25 MG tablet TAKE 1-1.5 TABLETS (25-37.5 MG TOTAL) BY MOUTH DAILY. 45 tablet 5  . levothyroxine (SYNTHROID) 50 MCG tablet Take 1 tablet (50 mcg total) by mouth daily before breakfast. 30 tablet 6  . metFORMIN (GLUCOPHAGE-XR) 750 MG 24 hr tablet Take 750 mg by mouth daily.    . metroNIDAZOLE (METROGEL) 1 % gel Apply topically daily. Use on your face as needed for irritation 60 g 0  . norethindrone (AYGESTIN) 5 MG tablet  Take 10 mg by mouth daily.      No current facility-administered medications on file prior to visit.    Review of Systems:  As per HPI- otherwise negative. Occasional sneezing- allergies  She otherwise feels fine No fever   Physical Examination: Vitals:   02/21/20 1539  BP: 132/80  Pulse: 91  Resp: 17  Temp: (!) 97 F (36.1 C)  SpO2: 98%   Vitals:   02/21/20 1539  Weight: (!) 361 lb (163.7 kg)  Height: 5\' 8"  (1.727 m)   Body mass index is 54.89 kg/m. Ideal Body Weight: Weight in (lb) to have BMI = 25: 164.1  GEN: no acute distress.  Obese, looks well HEENT: Atraumatic, Normocephalic.   Bilateral TM wnl, oropharynx normal.   PEERL,EOMI.   Both internal ear canals are normal.  She does have some nasal congestion Ears and Nose: No external deformity. CV: RRR, No M/G/R. No JVD. No thrill. No extra heart sounds. PULM: CTA B, no wheezes, crackles, rhonchi. No retractions. No resp. distress. No accessory muscle use. EXTR: No c/c/e PSYCH: Normally interactive. Conversant.    Assessment and Plan: Acquired hypothyroidism - Plan: TSH  Tinnitus of right ear  Here today with concern of a recent episode of tinnitus in the right ear and vertigo.  The symptoms are now resolved.  The vertigo was more momentary as might be seen with BPPV as opposed to Mnire's disease.  However, I counseled her that the combination of tinnitus and vertigo does make Mnire's a possibility.  Offered to refer her to ENT now, she prefers to see if the symptoms return.  She will keep me posted I also suggested a nasal steroid spray to help with nasal congestion which might contribute We will check TSH today as we recently adjusted her Synthroid dose  Moderate medical decision making today This visit occurred during the SARS-CoV-2 public health emergency.  Safety protocols were in place, including screening questions prior to the visit, additional usage of staff PPE, and extensive cleaning of exam room while observing appropriate contact time as indicated for disinfecting solutions.    Signed Lamar Blinks, MD  Received her TSH 3/9- better but still elevated She is currently taking 50 mcg- will see if she has any 25 on hand   Results for orders placed or performed in visit on 02/21/20  TSH  Result Value Ref Range   TSH 6.85 (H) 0.35 - 4.50 uIU/mL

## 2020-02-21 NOTE — Patient Instructions (Addendum)
Good to see you again today!    Please try a nasal steroid spray such as flonase or nasonex If you have this issue again please contact me- in that case you may have Meniere disease, and we would want to have you see ENT

## 2020-02-22 ENCOUNTER — Encounter: Payer: Self-pay | Admitting: Family Medicine

## 2020-02-22 DIAGNOSIS — E039 Hypothyroidism, unspecified: Secondary | ICD-10-CM

## 2020-02-22 LAB — TSH: TSH: 6.85 u[IU]/mL — ABNORMAL HIGH (ref 0.35–4.50)

## 2020-02-22 MED ORDER — LEVOTHYROXINE SODIUM 75 MCG PO TABS: 75.0000 ug | ORAL_TABLET | Freq: Every day | ORAL | 2 refills | Status: DC

## 2020-02-22 NOTE — Addendum Note (Signed)
Addended by: Abbe Amsterdam C on: 02/22/2020 02:56 PM   Modules accepted: Orders

## 2020-04-17 ENCOUNTER — Other Ambulatory Visit: Payer: Self-pay | Admitting: Family Medicine

## 2020-04-17 DIAGNOSIS — E039 Hypothyroidism, unspecified: Secondary | ICD-10-CM

## 2020-07-30 ENCOUNTER — Other Ambulatory Visit: Payer: Self-pay | Admitting: Family Medicine

## 2020-07-30 DIAGNOSIS — R600 Localized edema: Secondary | ICD-10-CM

## 2020-08-11 ENCOUNTER — Ambulatory Visit: Payer: 59 | Admitting: Family

## 2020-08-11 ENCOUNTER — Encounter: Payer: Self-pay | Admitting: Family

## 2020-08-11 ENCOUNTER — Other Ambulatory Visit: Payer: Self-pay

## 2020-08-11 ENCOUNTER — Ambulatory Visit (HOSPITAL_BASED_OUTPATIENT_CLINIC_OR_DEPARTMENT_OTHER)
Admission: RE | Admit: 2020-08-11 | Discharge: 2020-08-11 | Disposition: A | Discharge status: Home / Self Care | Payer: 59 | Source: Ambulatory Visit | Attending: Family | Admitting: Family

## 2020-08-11 VITALS — BP 147/80 | HR 70 | Temp 98.3°F | Resp 16 | Wt 360.0 lb

## 2020-08-11 DIAGNOSIS — M79671 Pain in right foot: Secondary | ICD-10-CM | POA: Diagnosis not present

## 2020-08-11 MED ORDER — MELOXICAM 7.5 MG PO TABS: 7.5000 mg | ORAL_TABLET | Freq: Every day | ORAL | 0 refills | Status: DC

## 2020-08-11 NOTE — Progress Notes (Signed)
Subjective:    Patient ID: Cassandra Levy, female    DOB: 27-Nov-1981, 39 y.o.   MRN: [id number]  HPI   Patient is a 39 yr old female who presents today with chief complaint of right knee "popping." this began 1 week ago.  Right foot pain- reports right foot pain which began yesterday. Notes that pain started yesterday and is worse when she weight bears.  She had to take the afternoon off from work due to pain, tried ibuprofen without improvement.   Reports that her right knee has been popping.  Felt "like pressure like I needed to pop it."  Yesterday, she noted foot tenderness.  She denies pain or swelling of the right knee.     Review of Systems See HPI  Past Medical History:  Diagnosis Date   Allergy    Anemia    Thyroid disease      Social History   Socioeconomic History   Marital status: Married    Spouse name: Not on file   Number of children: 0   Years of education: Not on file   Highest education level: Not on file  Occupational History   Not on file  Tobacco Use   Smoking status: Never Smoker   Smokeless tobacco: Never Used  Vaping Use   Vaping Use: Never used  Substance and Sexual Activity   Alcohol use: No   Drug use: No   Sexual activity: Yes  Other Topics Concern   Not on file  Social History Narrative   Not on file   Social Determinants of Health   Financial Resource Strain:    Difficulty of Paying Living Expenses: Not on file  Food Insecurity:    Worried About Running Out of Food in the Last Year: Not on file   Ran Out of Food in the Last Year: Not on file  Transportation Needs:    Lack of Transportation (Medical): Not on file   Lack of Transportation (Non-Medical): Not on file  Physical Activity:    Days of Exercise per Week: Not on file   Minutes of Exercise per Session: Not on file  Stress:    Feeling of Stress : Not on file  Social Connections:    Frequency of Communication with Friends and Family: Not on  file   Frequency of Social Gatherings with Friends and Family: Not on file   Attends Religious Services: Not on file   Active Member of Clubs or Organizations: Not on file   Attends Banker Meetings: Not on file   Marital Status: Not on file  Intimate Partner Violence:    Fear of Current or Ex-Partner: Not on file   Emotionally Abused: Not on file   Physically Abused: Not on file   Sexually Abused: Not on file    Past Surgical History:  Procedure Laterality Date   CHOLECYSTECTOMY     COLPOSCOPY      Family History  Problem Relation Age of Onset   Stroke Mother    Leukemia Other     Allergies  Allergen Reactions   Amoxicillin Swelling    Tightness in throat   Bee Venom    Cleocin [Clindamycin Hcl]     hives    Current Outpatient Medications on File Prior to Visit  Medication Sig Dispense Refill   EPINEPHrine (EPIPEN) 0.3 mg/0.3 mL SOAJ injection Inject 0.3 mLs (0.3 mg total) into the muscle once. 2 Device 2   hydrochlorothiazide (HYDRODIURIL) 25 MG tablet  TAKE 1-1.5 TABLETS (25-37.5 MG TOTAL) BY MOUTH DAILY. 45 tablet 5   levothyroxine (SYNTHROID) 75 MCG tablet TAKE 1 TABLET (75 MCG TOTAL) BY MOUTH DAILY BEFORE BREAKFAST. 30 tablet 2   metFORMIN (GLUCOPHAGE-XR) 750 MG 24 hr tablet Take 750 mg by mouth daily.     metroNIDAZOLE (METROGEL) 1 % gel Apply topically daily. Use on your face as needed for irritation 60 g 0   norethindrone (AYGESTIN) 5 MG tablet Take 10 mg by mouth daily.      No current facility-administered medications on file prior to visit.    BP (!) 147/80 (BP Location: Right Arm, Patient Position: Sitting, Cuff Size: Large)    Pulse 70    Temp 98.3 F (36.8 C) (Oral)    Resp 16    Wt (!) 360 lb (163.3 kg)    SpO2 99%    BMI 54.74 kg/m       Objective:   Physical Exam Constitutional:      Appearance: She is well-developed.  Neck:     Thyroid: No thyromegaly.  Cardiovascular:     Rate and Rhythm: Normal rate and  regular rhythm.     Heart sounds: Normal heart sounds. No murmur heard.   Pulmonary:     Effort: Pulmonary effort is normal. No respiratory distress.     Breath sounds: Normal breath sounds. No wheezing.  Musculoskeletal:     Cervical back: Neck supple.     Comments: + tenderness to palpation dorsal foot at the base of the 4th metatarsal  Moderate swelling of the right foot. No erythema  R knee is without swelling. Full ROM of right knee.    Skin:    General: Skin is warm and dry.  Neurological:     Mental Status: She is alert and oriented to person, place, and time.  Psychiatric:        Behavior: Behavior normal.        Thought Content: Thought content normal.        Judgment: Judgment normal.           Assessment & Plan:  R foot pain- new. etiology unclear.  Will check uric acid to evaluate for gout as well as x-ray to evaluate from fracture. In the meantime, recommended meloxicam, icing, elevation and that she contact us if symptoms worsen or if they are not improved in 2 weeks. Pt verbalizes understanding.   This visit occurred during the SARS-CoV-2 public health emergency.  Safety protocols were in place, including screening questions prior to the visit, additional usage of staff PPE, and extensive cleaning of exam room while observing appropriate contact time as indicated for disinfecting solutions.

## 2020-08-11 NOTE — Patient Instructions (Signed)
Please complete lab work prior to leaving. Complete x ray downstairs in imaging. Begin meloxicam once daily for foot pain. Call if symptoms worsen or if not improved in 2 weeks.

## 2020-08-12 ENCOUNTER — Telehealth: Payer: Self-pay | Admitting: Family

## 2020-08-12 LAB — URIC ACID: Uric Acid, Serum: 6.1 mg/dL (ref 2.5–7.0)

## 2020-08-12 MED ORDER — COLCHICINE 0.6 MG PO TABS: ORAL_TABLET | ORAL | 0 refills | Status: DC

## 2020-08-12 NOTE — Telephone Encounter (Signed)
See mychart.  

## 2020-08-14 ENCOUNTER — Ambulatory Visit: Payer: 59 | Admitting: Family Medicine

## 2020-08-29 ENCOUNTER — Other Ambulatory Visit: Payer: Self-pay

## 2020-08-29 ENCOUNTER — Encounter: Payer: Self-pay | Admitting: Family Medicine

## 2020-08-29 ENCOUNTER — Ambulatory Visit: Payer: 59 | Admitting: Family Medicine

## 2020-08-29 VITALS — BP 132/88 | HR 90 | Temp 98.4°F | Ht 68.5 in | Wt 358.0 lb

## 2020-08-29 DIAGNOSIS — M25561 Pain in right knee: Secondary | ICD-10-CM | POA: Diagnosis not present

## 2020-08-29 NOTE — Patient Instructions (Addendum)
Ice/cold pack over area for 10-15 min twice daily.  Heat (pad or rice pillow in microwave) over affected area, 10-15 minutes twice daily.   Send me a message in 3-4 weeks if we aren't turning the corner.  Semimembranosus Tendinitis Rehab  It is normal to feel mild stretching, pulling, tightness, or discomfort as you do these exercises, but you should stop right away if you feel sudden pain or your pain gets worse.  Stretching and range of motion exercises These exercises warm up your muscles and joints and improve the movement and flexibility of your thigh. These exercises also help to relieve pain, numbness, and tingling. Exercise A: Hamstring stretch, supine    1. Lie on your back. Loop a belt or towel across the ball of your left / right foot The ball of your foot is on the walking surface, right under your toes. 2. Straighten your left / right knee and slowly pull on the belt to raise your leg. Stop when you feel a gentle stretch behind your left / right knee or thigh. ? Do not allow the knee to bend. ? Keep your other leg flat on the floor. 3. Hold this position for 30 seconds. Repeat 2 times. Complete this exercise 3 times a week. Strengthening exercises These exercises build strength and endurance in your thigh. Endurance is the ability to use your muscles for a long time, even after they get tired. Exercise B: Straight leg raises (hip extensors) 1. Lie on your belly on a bed or a firm surface with a pillow under your hips. 2. Bend your left / right knee so your foot is straight up in the air. 3. Squeeze your buttock muscles and lift your left / right thigh off the bed. Do not let your back arch. 4. Hold this position for 3 seconds. 5. Slowly return to the starting position. Let your muscles relax completely before you do another repetition. Repeat 2 times. Complete this exercise 3 times a week. Exercise C: Bridge (hip extensors)     1. Lie on your back on a firm surface with  your knees bent and your feet flat on the floor. 2. Tighten your buttocks muscles and lift your bottom off the floor until your trunk is level with your thighs. ? You should feel the muscles working in your buttocks and the back of your thighs. If you do not feel these muscles, slide your feet 1-2 inches (2.5-5 cm) farther away from your buttocks. ? Do not arch your back. 3. Hold this position for 3 seconds. 4. Slowly lower your hips to the starting position. 5. Let your buttocks muscles relax completely between repetitions. If this exercise is too easy, try doing it with your arms crossed over your chest. Repeat 2 times. Complete this exercise 3 times a week. Exercise D: Hamstring eccentric, prone 1. Lie on your belly on a bed or on the floor. 2. Start with your legs straight. Cross your legs at the ankles with your left / right leg on top. 3. Using your bottom leg to do the work, bend both knees. 4. Using just your left / right leg alone, slowly lower your leg back down toward the bed. Add a 5 lb weight as told by your health care provider. 5. Let your muscles relax completely between repetitions. Repeat 2 times. Complete this exercise 3 times a week. Exercise E: Squats 1. Stand in front of a table, with your feet and knees pointing straight ahead. You may  rest your hands on the table for balance but not for support. 2. Slowly bend your knees and lower your hips like you are going to sit in a chair. Keep your thighs straight or pointed slightly outward. ? Keep your weight over your heels, not over your toes. ? Keep your lower legs upright so they are parallel with the table legs. ? Do not let your hips go lower than your knees. Stop when your knees are bent to the shape of an upside-down letter "L" (90 degree angle). ? Do not bend lower than told by your health care provider. ? If your knee pain increases, do not bend as low. 3. Hold the squat position 1-2 seconds. 4. Slowly push with your  legs to return to standing. Do not use your hands to pull yourself to standing. Repeat 2 times. Complete this exercise 3 times a week. Make sure you discuss any questions you have with your health care provider. Document Released: 12/02/2005 Document Revised: 08/08/2016 Document Reviewed: 09/05/2015 Elsevier Interactive Patient Education  2018 [employer]  Knee Exercises It is normal to feel mild stretching, pulling, tightness, or discomfort as you do these exercises, but you should stop right away if you feel sudden pain or your pain gets worse. STRETCHING AND RANGE OF MOTION EXERCISES  These exercises warm up your muscles and joints and improve the movement and flexibility of your knee. These exercises also help to relieve pain, numbness, and tingling. Exercise A: Knee Extension, Prone  1. Lie on your abdomen on a bed. 2. Place your left / right knee just beyond the edge of the surface so your knee is not on the bed. You can put a towel under your left / right thigh just above your knee for comfort. 3. Relax your leg muscles and allow gravity to straighten your knee. You should feel a stretch behind your left / right knee. 4. Hold this position for 30 seconds. 5. Scoot up so your knee is supported between repetitions. Repeat 2 times. Complete this stretch 3 times per week. Exercise B: Knee Flexion, Active    1. Lie on your back with both knees straight. If this causes back discomfort, bend your left / right knee so your foot is flat on the floor. 2. Slowly slide your left / right heel back toward your buttocks until you feel a gentle stretch in the front of your knee or thigh. 3. Hold this position for 30 seconds. 4. Slowly slide your left / right heel back to the starting position. Repeat 2 times. Complete this exercise 3 times per week. Exercise C: Quadriceps, Prone    1. Lie on your abdomen on a firm surface, such as a bed or padded floor. 2. Bend your left / right knee and  hold your ankle. If you cannot reach your ankle or pant leg, loop a belt around your foot and grab the belt instead. 3. Gently pull your heel toward your buttocks. Your knee should not slide out to the side. You should feel a stretch in the front of your thigh and knee. 4. Hold this position for 30 seconds. Repeat 2 times. Complete this stretch 3 times per week. Exercise D: Hamstring, Supine  1. Lie on your back. 2. Loop a belt or towel over the ball of your left / right foot. The ball of your foot is on the walking surface, right under your toes. 3. Straighten your left / right knee and slowly pull on the belt  to raise your leg until you feel a gentle stretch behind your knee. ? Do not let your left / right knee bend while you do this. ? Keep your other leg flat on the floor. 4. Hold this position for 30 seconds. Repeat 2 times. Complete this stretch 3 times per week. STRENGTHENING EXERCISES  These exercises build strength and endurance in your knee. Endurance is the ability to use your muscles for a long time, even after they get tired. Exercise E: Quadriceps, Isometric    1. Lie on your back with your left / right leg extended and your other knee bent. Put a rolled towel or small pillow under your knee if told by your health care provider. 2. Slowly tense the muscles in the front of your left / right thigh. You should see your kneecap slide up toward your hip or see increased dimpling just above the knee. This motion will push the back of the knee toward the floor. 3. For 3 seconds, keep the muscle as tight as you can without increasing your pain. 4. Relax the muscles slowly and completely. Repeat for 10 total reps Repeat 2 ti mes. Complete this exercise 3 times per week. Exercise F: Straight Leg Raises - Quadriceps  1. Lie on your back with your left / right leg extended and your other knee bent. 2. Tense the muscles in the front of your left / right thigh. You should see your kneecap  slide up or see increased dimpling just above the knee. Your thigh may even shake a bit. 3. Keep these muscles tight as you raise your leg 4-6 inches (10-15 cm) off the floor. Do not let your knee bend. 4. Hold this position for 3 seconds. 5. Keep these muscles tense as you lower your leg. 6. Relax your muscles slowly and completely after each repetition. 10 total reps. Repeat 2 times. Complete this exercise 3 times per week.  Exercise G: Hamstring Curls    If told by your health care provider, do this exercise while wearing ankle weights. Begin with 5 lb weights (optional). Then increase the weight by 1 lb (0.5 kg) increments. Do not wear ankle weights that are more than 20 lbs to start with. 1. Lie on your abdomen with your legs straight. 2. Bend your left / right knee as far as you can without feeling pain. Keep your hips flat against the floor. 3. Hold this position for 3 seconds. 4. Slowly lower your leg to the starting position. Repeat for 10 reps.  Repeat 2 times. Complete this exercise 3 times per week. Exercise H: Squats (Quadriceps)  1. Stand in front of a table, with your feet and knees pointing straight ahead. You may rest your hands on the table for balance but not for support. 2. Slowly bend your knees and lower your hips like you are going to sit in a chair. ? Keep your weight over your heels, not over your toes. ? Keep your lower legs upright so they are parallel with the table legs. ? Do not let your hips go lower than your knees. ? Do not bend lower than told by your health care provider. ? If your knee pain increases, do not bend as low. 3. Hold the squat position for 1 second. 4. Slowly push with your legs to return to standing. Do not use your hands to pull yourself to standing. Repeat 2 times. Complete this exercise 3 times per week. Exercise I: Wall Slides (Quadriceps)  1. Lean your back against a smooth wall or door while you walk your feet out 18-24 inches  (46-61 cm) from it. 2. Place your feet hip-width apart. 3. Slowly slide down the wall or door until your knees Repeat 2 times. Complete this exercise every other day. 4. Exercise K: Straight Leg Raises - Hip Abductors  1. Lie on your side with your left / right leg in the top position. Lie so your head, shoulder, knee, and hip line up. You may bend your bottom knee to help you keep your balance. 2. Roll your hips slightly forward so your hips are stacked directly over each other and your left / right knee is facing forward. 3. Leading with your heel, lift your top leg 4-6 inches (10-15 cm). You should feel the muscles in your outer hip lifting. ? Do not let your foot drift forward. ? Do not let your knee roll toward the ceiling. 4. Hold this position for 3 seconds. 5. Slowly return your leg to the starting position. 6. Let your muscles relax completely after each repetition. 10 total reps. Repeat 2 times. Complete this exercise 3 times per week. Exercise J: Straight Leg Raises - Hip Extensors  1. Lie on your abdomen on a firm surface. You can put a pillow under your hips if that is more comfortable. 2. Tense the muscles in your buttocks and lift your left / right leg about 4-6 inches (10-15 cm). Keep your knee straight as you lift your leg. 3. Hold this position for 3 seconds. 4. Slowly lower your leg to the starting position. 5. Let your leg relax completely after each repetition. Repeat 2 times. Complete this exercise 3 times per week. Document Released: 10/16/2005 Document Revised: 08/26/2016 Document Reviewed: 10/08/2015 Elsevier Interactive Patient Education  2017 [employer].

## 2020-08-29 NOTE — Progress Notes (Signed)
Musculoskeletal Exam  Patient: Cassandra Levy DOB: [birth year]  DOS: 08/29/2020  SUBJECTIVE:  Chief Complaint:   Chief Complaint  Patient presents with  . Knee Pain    right    Cassandra Levy is a 39 y.o.  female for evaluation and treatment of R knee pain.   Onset:  3 weeks ago started popping, pain started 3-4 days ago. No inj or change in activity.  Location: anterior knee Character:  burning, sharp Progression of issue:  has worsened Associated symptoms: Feels unsteady, limping; no redness, swelling, bruising, decreased ROm Treatment: to date has been rest.   Neurovascular symptoms: no  Past Medical History:  Diagnosis Date  . Allergy   . Anemia   . Thyroid disease     Objective: VITAL SIGNS: BP 132/88 (BP Location: Left Arm, Patient Position: Sitting, Cuff Size: Large)   Pulse 90   Temp 98.4 F (36.9 C) (Oral)   Ht 5' 8.5" (1.74 m)   Wt (!) 358 lb (162.4 kg)   SpO2 98%   BMI 53.64 kg/m  Constitutional: Well formed, well developed. No acute distress. Thorax & Lungs: No accessory muscle use Musculoskeletal: R knee.   Normal active range of motion: yes.   Normal passive range of motion: yes Tenderness to palpation: yes over distal medial hamstring Deformity: no Ecchymosis: no Tests positive: None Tests negative: Patellar apprehension/grind, Lachman's, posterior drawer, varus/valgus stress, McMurray's, Stines, Thessaly's Neurologic: Normal sensory function.  Psychiatric: Normal mood. Age appropriate judgment and insight. Alert & oriented x 3.    Assessment:  Acute pain of right knee  Plan: I think she strained her hamstring and may have patellofemoral syndrome as well.  Stretches/exercises, heat, ice, Tylenol.  70 message in 3-4 weeks if no improvement.  We will set her up with a sports medicine team. F/u as needed. The patient voiced understanding and agreement to the plan.   [name], DO 08/29/20  4:26 PM

## 2020-12-17 NOTE — Progress Notes (Addendum)
[facility] at [facility] [address] [place], [place] [address] [phone]  Date:  12/20/2020   Name:  Cassandra Levy   DOB:  [birth year]   MRN:  [id number]  PCP:  Pearline Cables, MD    Chief Complaint: No chief complaint on file.   History of Present Illness:  Cassandra Levy is a 40 y.o. very pleasant female patient who presents with the following:  Patient today for physical exam Last seen by myself in March of this year  History of obesity, PCOS, lower extremity edema, hypothyroidism/ hyperthyroidism? See note by endocrinology, Dr. Everardo All July 2015 Her LE edema is not too bad right now  She is taking hctz 25- 37.5 once daily- if she continues this dose swelling is controled   Hepatitis C and HIV screening- can do today Pt would like to make sure she is hep B immune as well  Pap smear- her most recent pap was last year, normal  Flu vaccine- allergic COVID vaccine- not done yet, pt is not sure if safe due to flu shot allergy  Pt had a vigorous localized reaction to the flu shot several years ago- redness, heat and tenderness in arm   Most recent lab work approximately 1 year ago She ran out of her thyroid meds in October so has not taken in about 3 months  Over the summer she got a "heat rash" in her groin- it has gotten better but not resolved  She is not sure what to use to clear this up   She wonders if we can take over her pap- this is certainly fine. UTD for now- done last year  She and her husband would like to conceive we discussed this today.  They have been SA without contraception for about one year now.  She did have a MC about 9 years ago    Patient Active Problem List   Diagnosis Date Noted  . Morbid obesity (HCC) 10/03/2015  . Hyperthyroidism 01/20/2014  . PCOS (polycystic ovarian syndrome) 12/13/2013  . Lower extremity edema 12/03/2013  . Obesity 10/08/2012  . Goiter 06/27/2012  .  Constipation 06/27/2012    Past Medical History:  Diagnosis Date  . Allergy   . Anemia   . Thyroid disease     Past Surgical History:  Procedure Laterality Date  . CHOLECYSTECTOMY    . COLPOSCOPY      Social History   Tobacco Use  . Smoking status: Never Smoker  . Smokeless tobacco: Never Used  Vaping Use  . Vaping Use: Never used  Substance Use Topics  . Alcohol use: No  . Drug use: No    Family History  Problem Relation Age of Onset  . Stroke Mother   . Leukemia Other     Allergies  Allergen Reactions  . Amoxicillin Swelling    Tightness in throat  . Bee Venom   . Cleocin [Clindamycin Hcl]     hives    Medication list has been reviewed and updated.  Current Outpatient Medications on File Prior to Visit  Medication Sig Dispense Refill  . EPINEPHrine (EPIPEN) 0.3 mg/0.3 mL SOAJ injection Inject 0.3 mLs (0.3 mg total) into the muscle once. 2 Device 2  . metFORMIN (GLUCOPHAGE-XR) 750 MG 24 hr tablet Take 750 mg by mouth daily.    . norethindrone (AYGESTIN) 5 MG tablet Take 10 mg by mouth daily.      No current  facility-administered medications on file prior to visit.    Review of Systems:  As per HPI- otherwise negative.    Physical Examination: Vitals:   12/20/20 1553  BP: 135/79  Pulse: 84  Resp: 16  Temp: 98.6 F (37 C)  SpO2: 98%   Vitals:   12/20/20 1553  Weight: (!) 364 lb (165.1 kg)  Height: 5' 8.5" (1.74 m)   Body mass index is 54.54 kg/m. Ideal Body Weight: Weight in (lb) to have BMI = 25: 166.5  GEN: no acute distress.  Obese, otherwise looks well  HEENT: Atraumatic, Normocephalic.  Ears and Nose: No external deformity. CV: RRR, No M/G/R. No JVD. No thrill. No extra heart sounds. PULM: CTA B, no wheezes, crackles, rhonchi. No retractions. No resp. distress. No accessory muscle use. ABD: S, NT, ND, +BS. No rebound. No HSM. EXTR: No c/c/e PSYCH: Normally interactive. Conversant.  She has a tinea rash in her bilateral groin     Assessment and Plan: Physical exam  Screening for hyperlipidemia - Plan: Lipid panel  Screening for diabetes mellitus - Plan: Comprehensive metabolic panel, Hemoglobin A1c  Acquired hypothyroidism - Plan: TSH, levothyroxine (SYNTHROID) 75 MCG tablet  Screening for deficiency anemia - Plan: CBC  Encounter for hepatitis C screening test for low risk patient - Plan: Hepatitis C antibody  Encounter for vitamin deficiency screening - Plan: VITAMIN D 25 Hydroxy (Vit-D Deficiency, Fractures)  Lower extremity edema - Plan: hydrochlorothiazide (HYDRODIURIL) 25 MG tablet  Need for hepatitis B screening test - Plan: Hepatitis B surface antibody,quantitative  Tinea cruris - Plan: clotrimazole (CLOTRIMAZOLE AF) 1 % cream  CPE today Labs pending as above Refilled medications- of note she has NOT been taking her thyroid meds for a few months Hep B an titer pending Refilled hctz for her edema Discussed fertility- encouraged her to take steps soon if she and her husband would like to conceive.  She has PCOS so this may be more challenging, best to consult with fertility asap Spoke with pharmD on phone- likely no cross reactivity between her flu shot allergy and covid vaccine.  Cannot know for certain but likely will not react.  Recommend that she do the shot here at the [facility] in case of emergency, consider taking benadryl prior to dose Will plan further follow- up pending labs.  This visit occurred during the SARS-CoV-2 public health emergency.  Safety protocols were in place, including screening questions prior to the visit, additional usage of staff PPE, and extensive cleaning of exam room while observing appropriate contact time as indicated for disinfecting solutions.    Signed Lamar Blinks, MD  Received her labs 1/6- message to pt  Results for orders placed or performed in visit on 12/20/20  CBC  Result Value Ref Range   WBC 9.4 4.0 - 10.5 K/uL   RBC 4.83 3.87 -  5.11 Mil/uL   Platelets 321.0 150.0 - 400.0 K/uL   Hemoglobin 14.4 12.0 - 15.0 g/dL   HCT 43.0 36.0 - 46.0 %   MCV 89.1 78.0 - 100.0 fl   MCHC 33.5 30.0 - 36.0 g/dL   RDW 13.6 11.5 - 15.5 %  Comprehensive metabolic panel  Result Value Ref Range   Sodium 138 135 - 145 mEq/L   Potassium 3.6 3.5 - 5.1 mEq/L   Chloride 100 96 - 112 mEq/L   CO2 30 19 - 32 mEq/L   Glucose, Bld 78 70 - 99 mg/dL   BUN 14 6 - 23 mg/dL  Creatinine, Ser 0.94 0.40 - 1.20 mg/dL   Total Bilirubin 0.5 0.2 - 1.2 mg/dL   Alkaline Phosphatase 80 39 - 117 U/L   AST 16 0 - 37 U/L   ALT 20 0 - 35 U/L   Total Protein 7.4 6.0 - 8.3 g/dL   Albumin 4.2 3.5 - 5.2 g/dL   GFR 56.31 >49.70 mL/min   Calcium 9.1 8.4 - 10.5 mg/dL  Hemoglobin Y6V  Result Value Ref Range   Hgb A1c MFr Bld 5.5 4.6 - 6.5 %  Hepatitis C antibody  Result Value Ref Range   Hepatitis C Ab NON-REACTIVE NON-REACTI   SIGNAL TO CUT-OFF 0.03 <1.00  TSH  Result Value Ref Range   TSH 9.02 (H) 0.35 - 4.50 uIU/mL  VITAMIN D 25 Hydroxy (Vit-D Deficiency, Fractures)  Result Value Ref Range   VITD 16.67 (L) 30.00 - 100.00 ng/mL  Lipid panel  Result Value Ref Range   Cholesterol 225 (H) 0 - 200 mg/dL   Triglycerides 78.5 0.0 - 149.0 mg/dL   HDL 88.50 >27.74 mg/dL   VLDL 12.8 0.0 - 78.6 mg/dL   LDL Cholesterol 767 (H) 0 - 99 mg/dL   Total CHOL/HDL Ratio 5    NonHDL 181.18   Hepatitis B surface antibody,quantitative  Result Value Ref Range   Hepatitis B-Post <5 (L) > OR = 10 mIU/mL

## 2020-12-19 NOTE — Patient Instructions (Addendum)
It was great to see you again today, I will be in touch your labs as soon as possible!  I refilled your HCTZ and thyroid today  [facility]: Address: [address], [place], [place] [address] Phone: [phone]  [facility]: Address: [address] [place], [place] [address] Phone: [phone]   Health Maintenance, Female Adopting a healthy lifestyle and getting preventive care are important in promoting health and wellness. Ask your health care provider about:  The right schedule for you to have regular tests and exams.  Things you can do on your own to prevent diseases and keep yourself healthy. What should I know about diet, weight, and exercise? Eat a healthy diet   Eat a diet that includes plenty of vegetables, fruits, low-fat dairy products, and lean protein.  Do not eat a lot of foods that are high in solid fats, added sugars, or sodium. Maintain a healthy weight Body mass index (BMI) is used to identify weight problems. It estimates body fat based on height and weight. Your health care provider can help determine your BMI and help you achieve or maintain a healthy weight. Get regular exercise Get regular exercise. This is one of the most important things you can do for your health. Most adults should:  Exercise for at least 150 minutes each week. The exercise should increase your heart rate and make you sweat (moderate-intensity exercise).  Do strengthening exercises at least twice a week. This is in addition to the moderate-intensity exercise.  Spend less time sitting. Even light physical activity can be beneficial. Watch cholesterol and blood lipids Have your blood tested for lipids and cholesterol at 40 years of age, then have this test every 5 years. Have your cholesterol levels checked more often if:  Your lipid or cholesterol levels are high.  You are older than 40 years of age.  You are at high risk for heart  disease. What should I know about cancer screening? Depending on your health history and family history, you may need to have cancer screening at various ages. This may include screening for:  Breast cancer.  Cervical cancer.  Colorectal cancer.  Skin cancer.  Lung cancer. What should I know about heart disease, diabetes, and high blood pressure? Blood pressure and heart disease  High blood pressure causes heart disease and increases the risk of stroke. This is more likely to develop in people who have high blood pressure readings, are of African descent, or are overweight.  Have your blood pressure checked: ? Every 3-5 years if you are 33-34 years of age. ? Every year if you are 40 years old or older. Diabetes Have regular diabetes screenings. This checks your fasting blood sugar level. Have the screening done:  Once every three years after age 45 if you are at a normal weight and have a low risk for diabetes.  More often and at a younger age if you are overweight or have a high risk for diabetes. What should I know about preventing infection? Hepatitis B If you have a higher risk for hepatitis B, you should be screened for this virus. Talk with your health care provider to find out if you are at risk for hepatitis B infection. Hepatitis C Testing is recommended for:  Everyone born from [birth year] through 1965.  Anyone with known risk factors for hepatitis C. Sexually transmitted infections (STIs)  Get screened for STIs, including gonorrhea and chlamydia, if: ? You are sexually active  and are younger than 39 years of age. ? You are older than 40 years of age and your health care provider tells you that you are at risk for this type of infection. ? Your sexual activity has changed since you were last screened, and you are at increased risk for chlamydia or gonorrhea. Ask your health care provider if you are at risk.  Ask your health care provider about whether you are at high  risk for HIV. Your health care provider may recommend a prescription medicine to help prevent HIV infection. If you choose to take medicine to prevent HIV, you should first get tested for HIV. You should then be tested every 3 months for as long as you are taking the medicine. Pregnancy  If you are about to stop having your period (premenopausal) and you may become pregnant, seek counseling before you get pregnant.  Take 400 to 800 micrograms (mcg) of folic acid every day if you become pregnant.  Ask for birth control (contraception) if you want to prevent pregnancy. Osteoporosis and menopause Osteoporosis is a disease in which the bones lose minerals and strength with aging. This can result in bone fractures. If you are 35 years old or older, or if you are at risk for osteoporosis and fractures, ask your health care provider if you should:  Be screened for bone loss.  Take a calcium or vitamin D supplement to lower your risk of fractures.  Be given hormone replacement therapy (HRT) to treat symptoms of menopause. Follow these instructions at home: Lifestyle  Do not use any products that contain nicotine or tobacco, such as cigarettes, e-cigarettes, and chewing tobacco. If you need help quitting, ask your health care provider.  Do not use street drugs.  Do not share needles.  Ask your health care provider for help if you need support or information about quitting drugs. Alcohol use  Do not drink alcohol if: ? Your health care provider tells you not to drink. ? You are pregnant, may be pregnant, or are planning to become pregnant.  If you drink alcohol: ? Limit how much you use to 0-1 drink a day. ? Limit intake if you are breastfeeding.  Be aware of how much alcohol is in your drink. In the [place] one drink equals one 12 oz bottle of beer (355 mL), one 5 oz glass of wine (148 mL), or one 1 oz glass of hard liquor (44 mL). General instructions  Schedule regular health, dental,  and eye exams.  Stay current with your vaccines.  Tell your health care provider if: ? You often feel depressed. ? You have ever been abused or do not feel safe at home. Summary  Adopting a healthy lifestyle and getting preventive care are important in promoting health and wellness.  Follow your health care provider's instructions about healthy diet, exercising, and getting tested or screened for diseases.  Follow your health care provider's instructions on monitoring your cholesterol and blood pressure. This information is not intended to replace advice given to you by your health care provider. Make sure you discuss any questions you have with your health care provider. Document Revised: 11/25/2018 Document Reviewed: 11/25/2018 Elsevier Patient Education  2020 [employer].

## 2020-12-20 ENCOUNTER — Other Ambulatory Visit: Payer: Self-pay

## 2020-12-20 ENCOUNTER — Encounter: Payer: Self-pay | Admitting: Family Medicine

## 2020-12-20 ENCOUNTER — Ambulatory Visit: Payer: 59 | Admitting: Family Medicine

## 2020-12-20 VITALS — BP 135/79 | HR 84 | Temp 98.6°F | Resp 16 | Ht 68.5 in | Wt 364.0 lb

## 2020-12-20 DIAGNOSIS — Z13220 Encounter for screening for lipoid disorders: Secondary | ICD-10-CM

## 2020-12-20 DIAGNOSIS — Z0000 Encounter for general adult medical examination without abnormal findings: Secondary | ICD-10-CM | POA: Diagnosis not present

## 2020-12-20 DIAGNOSIS — Z1321 Encounter for screening for nutritional disorder: Secondary | ICD-10-CM

## 2020-12-20 DIAGNOSIS — Z130 Encounter for screening for diseases of the blood and blood-forming organs and certain disorders involving the immune mechanism: Secondary | ICD-10-CM | POA: Diagnosis not present

## 2020-12-20 DIAGNOSIS — E039 Hypothyroidism, unspecified: Secondary | ICD-10-CM

## 2020-12-20 DIAGNOSIS — Z131 Encounter for screening for diabetes mellitus: Secondary | ICD-10-CM | POA: Diagnosis not present

## 2020-12-20 DIAGNOSIS — R600 Localized edema: Secondary | ICD-10-CM

## 2020-12-20 DIAGNOSIS — B356 Tinea cruris: Secondary | ICD-10-CM

## 2020-12-20 DIAGNOSIS — Z114 Encounter for screening for human immunodeficiency virus [HIV]: Secondary | ICD-10-CM

## 2020-12-20 DIAGNOSIS — Z1159 Encounter for screening for other viral diseases: Secondary | ICD-10-CM

## 2020-12-20 DIAGNOSIS — E559 Vitamin D deficiency, unspecified: Secondary | ICD-10-CM

## 2020-12-20 MED ORDER — LEVOTHYROXINE SODIUM 75 MCG PO TABS: 75.0000 ug | ORAL_TABLET | Freq: Every day | ORAL | 2 refills | Status: DC

## 2020-12-20 MED ORDER — CLOTRIMAZOLE 1 % EX CREA: 1.0000 "application " | TOPICAL_CREAM | Freq: Two times a day (BID) | CUTANEOUS | 0 refills | Status: AC

## 2020-12-20 MED ORDER — HYDROCHLOROTHIAZIDE 25 MG PO TABS: ORAL_TABLET | ORAL | 11 refills | Status: DC

## 2020-12-21 ENCOUNTER — Encounter: Payer: Self-pay | Admitting: Family Medicine

## 2020-12-21 ENCOUNTER — Other Ambulatory Visit: Payer: Self-pay | Admitting: Family Medicine

## 2020-12-21 DIAGNOSIS — B356 Tinea cruris: Secondary | ICD-10-CM

## 2020-12-21 LAB — COMPREHENSIVE METABOLIC PANEL
ALT: 20 U/L (ref 0–35)
AST: 16 U/L (ref 0–37)
Albumin: 4.2 g/dL (ref 3.5–5.2)
Alkaline Phosphatase: 80 U/L (ref 39–117)
BUN: 14 mg/dL (ref 6–23)
CO2: 30 mEq/L (ref 19–32)
Calcium: 9.1 mg/dL (ref 8.4–10.5)
Chloride: 100 mEq/L (ref 96–112)
Creatinine, Ser: 0.94 mg/dL (ref 0.40–1.20)
GFR: 76.45 mL/min (ref >60.00)
Glucose, Bld: 78 mg/dL (ref 70–99)
Potassium: 3.6 mEq/L (ref 3.5–5.1)
Sodium: 138 mEq/L (ref 135–145)
Total Bilirubin: 0.5 mg/dL (ref 0.2–1.2)
Total Protein: 7.4 g/dL (ref 6.0–8.3)

## 2020-12-21 LAB — TSH: TSH: 9.02 u[IU]/mL — ABNORMAL HIGH (ref 0.35–4.50)

## 2020-12-21 LAB — CBC
HCT: 43 % (ref 36.0–46.0)
Hemoglobin: 14.4 g/dL (ref 12.0–15.0)
MCHC: 33.5 g/dL (ref 30.0–36.0)
MCV: 89.1 fl (ref 78.0–100.0)
Platelets: 321 10*3/uL (ref 150.0–400.0)
RBC: 4.83 Mil/uL (ref 3.87–5.11)
RDW: 13.6 % (ref 11.5–15.5)
WBC: 9.4 10*3/uL (ref 4.0–10.5)

## 2020-12-21 LAB — LIPID PANEL
Cholesterol: 225 mg/dL — ABNORMAL HIGH (ref 0–200)
HDL: 44.1 mg/dL (ref >39.00)
LDL Cholesterol: 165 mg/dL — ABNORMAL HIGH (ref 0–99)
NonHDL: 181.18
Total CHOL/HDL Ratio: 5
Triglycerides: 83 mg/dL (ref 0.0–149.0)
VLDL: 16.6 mg/dL (ref 0.0–40.0)

## 2020-12-21 LAB — HEPATITIS C ANTIBODY
Hepatitis C Ab: NONREACTIVE
SIGNAL TO CUT-OFF: 0.03 (ref <1.00)

## 2020-12-21 LAB — VITAMIN D 25 HYDROXY (VIT D DEFICIENCY, FRACTURES): VITD: 16.67 ng/mL — ABNORMAL LOW (ref 30.00–100.00)

## 2020-12-21 LAB — HEMOGLOBIN A1C: Hgb A1c MFr Bld: 5.5 % (ref 4.6–6.5)

## 2020-12-21 LAB — HEPATITIS B SURFACE ANTIBODY, QUANTITATIVE: Hep B S AB Quant (Post): <5 m[IU]/mL — ABNORMAL LOW (ref >=10)

## 2020-12-21 MED ORDER — VITAMIN D3 1.25 MG (50000 UT) PO CAPS: ORAL_CAPSULE | ORAL | 0 refills | Status: DC

## 2020-12-21 NOTE — Addendum Note (Signed)
Addended by: Abbe Amsterdam C on: 12/21/2020 07:29 PM   Modules accepted: Orders

## 2020-12-21 NOTE — Telephone Encounter (Signed)
Clotrimazole 1% cream not covered by insurance. No preferred alternatives given.

## 2021-01-05 ENCOUNTER — Other Ambulatory Visit: Payer: Self-pay

## 2021-01-05 ENCOUNTER — Encounter: Payer: Self-pay | Admitting: Family Medicine

## 2021-01-05 ENCOUNTER — Telehealth (INDEPENDENT_AMBULATORY_CARE_PROVIDER_SITE_OTHER): Payer: 59 | Admitting: Family Medicine

## 2021-01-05 VITALS — Temp 100.4°F

## 2021-01-05 DIAGNOSIS — R509 Fever, unspecified: Secondary | ICD-10-CM

## 2021-01-05 DIAGNOSIS — R52 Pain, unspecified: Secondary | ICD-10-CM | POA: Diagnosis not present

## 2021-01-05 MED ORDER — OSELTAMIVIR PHOSPHATE 75 MG PO CAPS: 75.0000 mg | ORAL_CAPSULE | Freq: Two times a day (BID) | ORAL | 0 refills | Status: DC

## 2021-01-05 NOTE — Progress Notes (Signed)
Virtual Visit via Video Note  I connected with Cassandra Levy on 01/05/21 at  1:00 PM EST by a video enabled telemedicine application and verified that I am speaking with the correct person using two identifiers.  Location: Patient: home alone  Provider: office    I discussed the limitations of evaluation and management by telemedicine and the availability of in person appointments. The patient expressed understanding and agreed to proceed.  History of Present Illness: Pt is home c/o fever an bodyaches with sore throat since yesterday    She is taking claritin daily  She [facility]   Observations/Objective: Vitals:   01/05/21 1310  Temp: (!) 100.4 F (38 C)   Pt is in nad  She is fatigued  No sob  Assessment and Plan: 1. Body aches She is getting tested for covid today May be flu Start tamiflu ] Use mucinex for congestion / cough -- drink plenty of water  F/u if symptoms worsen or if covid test is + - oseltamivir (TAMIFLU) 75 MG capsule; Take 1 capsule (75 mg total) by mouth 2 (two) times daily.  Dispense: 10 capsule; Refill: 0  2. Fever, unspecified fever cause Tylenol/ ib for fever  covid test today - oseltamivir (TAMIFLU) 75 MG capsule; Take 1 capsule (75 mg total) by mouth 2 (two) times daily.  Dispense: 10 capsule; Refill: 0   Follow Up Instructions:    I discussed the assessment and treatment plan with the patient. The patient was provided an opportunity to ask questions and all were answered. The patient agreed with the plan and demonstrated an understanding of the instructions.   The patient was advised to call back or seek an in-person evaluation if the symptoms worsen or if the condition fails to improve as anticipated.  I provided 25 minutes of non-face-to-face time during this encounter. 5  Donato Schultz, DO

## 2021-01-07 ENCOUNTER — Other Ambulatory Visit: Payer: Self-pay | Admitting: Family Medicine

## 2021-01-07 ENCOUNTER — Encounter: Payer: Self-pay | Admitting: Family Medicine

## 2021-01-07 DIAGNOSIS — R059 Cough, unspecified: Secondary | ICD-10-CM

## 2021-01-15 MED ORDER — PROMETHAZINE-DM 6.25-15 MG/5ML PO SYRP: 5.0000 mL | ORAL_SOLUTION | Freq: Four times a day (QID) | ORAL | 0 refills | Status: DC | PRN

## 2021-01-23 NOTE — Progress Notes (Signed)
[facility] at [facility] [address] [place], [place] [address] [phone]  Date:  01/24/2021   Name:  Cassandra Levy   DOB:  Apr 30, 1981   MRN:  [id number]  PCP:  Pearline Cables, MD    Chief Complaint: Follow Up Covid (Cough, fatigue, weakness) and Recurrent Sinusitis   History of Present Illness:  Cassandra Levy is a 40 y.o. very pleasant female patient who presents with the following:  Patient here today to follow-up from recent COVID-19 illness We last visited in January for routine physical History of obesity, PCOS, lower extremity edema, hypothyroidism/ hyperthyroidism-at that time her TSH was high, she had not been taking her thyroid replacement for some time.  I had her start back on levothyroxine 75.  We can recheck TSH today  She became ill the last week of January-COVID-19 positive test obtained January 21, this is the same day she first noted sx and had a fever She was still positive when retested on February 4  Today she notes that she still has a cough, and she feels like there is mucus in her chest She is occasionally able to cough up some material- it will be thick and green when she does get it up She is using OTC medications as needed  She is monitoring her oxygen sat at home- the lowest she got was 87% at the hight of her illness.  Her oxygen saturation has been  back to normal for at least a few days now   She is having some diarrhea still but vomiting is now gone  She never got vaccinated against covid as she is allergic to the flu vaccine and she was not sure if it was safe to take the vaccine  She has not been taking her thyroid medication consistently due to feeling ill and vomiting. She notes that she has finally stopped vomiting and is overall doing somewhat better  Patient Active Problem List   Diagnosis Date Noted  . Morbid obesity (HCC) 10/03/2015  . Hyperthyroidism 01/20/2014  . PCOS  (polycystic ovarian syndrome) 12/13/2013  . Lower extremity edema 12/03/2013  . Obesity 10/08/2012  . Goiter 06/27/2012  . Constipation 06/27/2012    Past Medical History:  Diagnosis Date  . Allergy   . Anemia   . Thyroid disease     Past Surgical History:  Procedure Laterality Date  . CHOLECYSTECTOMY    . COLPOSCOPY      Social History   Tobacco Use  . Smoking status: Never Smoker  . Smokeless tobacco: Never Used  Vaping Use  . Vaping Use: Never used  Substance Use Topics  . Alcohol use: No  . Drug use: No    Family History  Problem Relation Age of Onset  . Stroke Mother   . Leukemia Other     Allergies  Allergen Reactions  . Amoxicillin Swelling    Tightness in throat  . Bee Venom   . Cleocin [Clindamycin Hcl]     hives    Medication list has been reviewed and updated.  Current Outpatient Medications on File Prior to Visit  Medication Sig Dispense Refill  . Cholecalciferol (VITAMIN D3) 1.25 MG (50000 UT) CAPS Take 1 weekly for 12 weeks 12 capsule 0  . clotrimazole (CLOTRIMAZOLE AF) 1 % cream Apply 1 application topically 2 (two) times daily. 60 g 0  . EPINEPHrine (EPIPEN) 0.3 mg/0.3 mL SOAJ injection Inject 0.3 mLs (0.3 mg  total) into the muscle once. 2 Device 2  . hydrochlorothiazide (HYDRODIURIL) 25 MG tablet TAKE 1-1.5 TABLETS (25-37.5 MG TOTAL) BY MOUTH DAILY. 45 tablet 11  . levothyroxine (SYNTHROID) 75 MCG tablet Take 1 tablet (75 mcg total) by mouth daily before breakfast. 30 tablet 2  . metFORMIN (GLUCOPHAGE-XR) 750 MG 24 hr tablet Take 750 mg by mouth daily.    . norethindrone (AYGESTIN) 5 MG tablet Take 10 mg by mouth daily.     . promethazine-dextromethorphan (PROMETHAZINE-DM) 6.25-15 MG/5ML syrup Take 5 mLs by mouth 4 (four) times daily as needed for cough. 118 mL 0   No current facility-administered medications on file prior to visit.    Review of Systems:  As per HPI- otherwise negative.   Physical Examination: Vitals:   01/24/21  1357  BP: (!) 142/82  Pulse: 89  Resp: 18  Temp: 98.4 F (36.9 C)  SpO2: 98%   Vitals:   01/24/21 1357  Weight: (!) 351 lb (159.2 kg)  Height: 5' 8.5" (1.74 m)   Body mass index is 52.59 kg/m. Ideal Body Weight: Weight in (lb) to have BMI = 25: 166.5  GEN: no acute distress.  Obese, looks well and her normal self HEENT: Atraumatic, Normocephalic.  Ears and Nose: No external deformity. CV: RRR, No M/G/R. No JVD. No thrill. No extra heart sounds. PULM: CTA B, no wheezes, crackles, rhonchi. No retractions. No resp. distress. No accessory muscle use.  Lungs are benign at time of exam ABD: S, NT, ND, +BS. No rebound. No HSM. EXTR: No c/c/e PSYCH: Normally interactive. Conversant.    Assessment and Plan: COVID-19 - Plan: DG Chest 2 View, predniSONE (DELTASONE) 20 MG tablet, doxycycline (VIBRAMYCIN) 100 MG capsule  Acquired hypothyroidism - Plan: TSH  Patient today for follow-up after COVID-19.  She was diagnosed with Covid nearly 3 weeks ago now.  Unfortunately she continues to feel tired, still coughing quite a bit and has not been strong enough to return to work  We plan to treat her with prednisone and antibiotics, will obtain chest film  Order TSH, asked her to make a lab visit only for TSH check when she has been back on her thyroid medication for about 1 month This visit occurred during the SARS-CoV-2 public health emergency.  Safety protocols were in place, including screening questions prior to the visit, additional usage of staff PPE, and extensive cleaning of exam room while observing appropriate contact time as indicated for disinfecting solutions.    Signed Abbe Amsterdam, MD  Received her chest film as below, message to patient and also gave her a call.  She is penicillin allergic.  Will DC doxycycline and change to Levaquin.  Asked her let me know if not feeling better in the next few days  DG Chest 2 View  Result Date: 01/24/2021 CLINICAL DATA:  Patient  diagnosed with COVID-19 01/05/2021. Continued cough and fatigue. EXAM: CHEST - 2 VIEW COMPARISON:  PA and lateral chest 05/21/2018. FINDINGS: Patchy airspace disease is seen in the left upper lobe. No pneumothorax or pleural effusion. Heart size is normal. No acute or focal bony abnormality. IMPRESSION: Patchy airspace disease in left upper lobe most worrisome for pneumonia. Electronically Signed   By: Drusilla Kanner M.D.   On: 01/24/2021 14:54

## 2021-01-24 ENCOUNTER — Other Ambulatory Visit: Payer: Self-pay

## 2021-01-24 ENCOUNTER — Encounter: Payer: Self-pay | Admitting: Family Medicine

## 2021-01-24 ENCOUNTER — Ambulatory Visit: Payer: 59 | Admitting: Family Medicine

## 2021-01-24 ENCOUNTER — Ambulatory Visit (HOSPITAL_BASED_OUTPATIENT_CLINIC_OR_DEPARTMENT_OTHER)
Admission: RE | Admit: 2021-01-24 | Discharge: 2021-01-24 | Disposition: A | Discharge status: Home / Self Care | Payer: 59 | Source: Ambulatory Visit | Attending: Family Medicine | Admitting: Family Medicine

## 2021-01-24 VITALS — BP 142/82 | HR 89 | Temp 98.4°F | Resp 18 | Ht 68.5 in | Wt 351.0 lb

## 2021-01-24 DIAGNOSIS — E039 Hypothyroidism, unspecified: Secondary | ICD-10-CM | POA: Diagnosis not present

## 2021-01-24 DIAGNOSIS — J189 Pneumonia, unspecified organism: Secondary | ICD-10-CM | POA: Diagnosis not present

## 2021-01-24 DIAGNOSIS — U071 COVID-19: Secondary | ICD-10-CM | POA: Diagnosis not present

## 2021-01-24 MED ORDER — DOXYCYCLINE HYCLATE 100 MG PO CAPS: 100.0000 mg | ORAL_CAPSULE | Freq: Two times a day (BID) | ORAL | 0 refills | Status: DC

## 2021-01-24 MED ORDER — PREDNISONE 20 MG PO TABS: ORAL_TABLET | ORAL | 0 refills | Status: DC

## 2021-01-24 MED ORDER — LEVOFLOXACIN 750 MG PO TABS: 750.0000 mg | ORAL_TABLET | Freq: Every day | ORAL | 0 refills | Status: DC

## 2021-01-24 NOTE — Patient Instructions (Signed)
Please go to the ground floor for your x-ray, then you are all set to go We are going to treat you with steroids (prednisone) and antibiotics (doxycycine) Please let me know how you are feeling over the next few days Take the prednisone for 5 days- if doing much better you can stop then, or continue for an additional 5 days The vitamin D is not an urgent issue- please take about 2,000 iu daily OTC  Please try to get back on your thyroid medication.  Once you have been taking it regularly for about a month please come in for a lab visit only for a TSH check

## 2021-01-30 NOTE — Progress Notes (Signed)
[facility] at [facility] [address] [place], [place] [address] [phone]  Date:  02/01/2021   Name:  Cassandra Levy   DOB:  10-20-1981   MRN:  [id number]  PCP:  Pearline Cables, MD    Chief Complaint: No chief complaint on file.   History of Present Illness:  Cassandra Levy is a 40 y.o. very pleasant female patient who presents with the following:  Virtual visit today for follow-up- pt seen by myself on 2/9 with pneumonia after covid 19 Patient location is home, provider location is office.  Patient identity confirmed with 2 factors, she gives consent for virtual visit today.  The patient myself are present on the call today Connected with patient over video  History of obesity, PCOS, lower extremity edema,hypothyroidism/ hyperthyroidism She tested positive for COVID-19 on January 21, she has been testing weekly as required by her job and is continue to test positive as of February 4.  At her last visit she was still coughing and not feeling great. Chest x-ray revealed pneumonia in left upper lobe Started on levaquin by mouth- she finished 7 days of treatment yesterday   She notes that she is still tired and still coughing -overall she does feel that she is improving, but does not yet feel strong enough to return to work  CXR from 2/9 CHEST - 2 VIEW COMPARISON:  PA and lateral chest 05/21/2018. FINDINGS: Patchy airspace disease is seen in the left upper lobe. No pneumothorax or pleural effusion. Heart size is normal. No acute or focal bony abnormality. IMPRESSION: Patchy airspace disease in left upper lobe most worrisome for Pneumonia.  The cough can keep her awake at night-make it difficult for her to rest I gave her some phenergan DM - this did not seem to help all that much  No fever - temp max about 98 which is a bit high for her   She is getting outside to walk her dog   Patient Active Problem List    Diagnosis Date Noted  . Morbid obesity (HCC) 10/03/2015  . Hyperthyroidism 01/20/2014  . PCOS (polycystic ovarian syndrome) 12/13/2013  . Lower extremity edema 12/03/2013  . Obesity 10/08/2012  . Goiter 06/27/2012  . Constipation 06/27/2012    Past Medical History:  Diagnosis Date  . Allergy   . Anemia   . Thyroid disease     Past Surgical History:  Procedure Laterality Date  . CHOLECYSTECTOMY    . COLPOSCOPY      Social History   Tobacco Use  . Smoking status: Never Smoker  . Smokeless tobacco: Never Used  Vaping Use  . Vaping Use: Never used  Substance Use Topics  . Alcohol use: No  . Drug use: No    Family History  Problem Relation Age of Onset  . Stroke Mother   . Leukemia Other     Allergies  Allergen Reactions  . Amoxicillin Swelling    Tightness in throat  . Bee Venom   . Cleocin [Clindamycin Hcl]     hives    Medication list has been reviewed and updated.  Current Outpatient Medications on File Prior to Visit  Medication Sig Dispense Refill  . Cholecalciferol (VITAMIN D3) 1.25 MG (50000 UT) CAPS Take 1 weekly for 12 weeks 12 capsule 0  . clotrimazole (CLOTRIMAZOLE AF) 1 % cream Apply 1 application topically 2 (two) times daily. 60 g 0  . EPINEPHrine (EPIPEN) 0.3  mg/0.3 mL SOAJ injection Inject 0.3 mLs (0.3 mg total) into the muscle once. 2 Device 2  . hydrochlorothiazide (HYDRODIURIL) 25 MG tablet TAKE 1-1.5 TABLETS (25-37.5 MG TOTAL) BY MOUTH DAILY. 45 tablet 11  . levofloxacin (LEVAQUIN) 750 MG tablet Take 1 tablet (750 mg total) by mouth daily. 7 tablet 0  . levothyroxine (SYNTHROID) 75 MCG tablet Take 1 tablet (75 mcg total) by mouth daily before breakfast. 30 tablet 2  . metFORMIN (GLUCOPHAGE-XR) 750 MG 24 hr tablet Take 750 mg by mouth daily.    . norethindrone (AYGESTIN) 5 MG tablet Take 10 mg by mouth daily.     . predniSONE (DELTASONE) 20 MG tablet Take 40 mg daily for 5 days, then 20 mg daily for 5 days if needed 15 tablet 0  .  promethazine-dextromethorphan (PROMETHAZINE-DM) 6.25-15 MG/5ML syrup Take 5 mLs by mouth 4 (four) times daily as needed for cough. 118 mL 0   No current facility-administered medications on file prior to visit.    Review of Systems:  As per HPI- otherwise negative.   Physical Examination: There were no vitals filed for this visit. There were no vitals filed for this visit. There is no height or weight on file to calculate BMI. Ideal Body Weight:    Sat running 97% last night Temp 97 today   New patient over video.  She looks well, her normal self.  Occasional cough Assessment and Plan: Community acquired pneumonia of left upper lobe of lung  Patient following up today from community-acquired pneumonia after COVID-19.  She has recently completed a course of Levaquin.  She does seem to be improving, but is concerned that she continues to cough.  I have reassured her that cough may last for several weeks following pneumonia treatment.  She is having a hard time sleeping at night-Phenergan DM syrup has not been effective Hycodan and Tussionex continue to be on backorder.  I called in a small number of hydrocodone tablets per to use as needed for pain and cough associate with pneumonia-caution regarding sedation  We made appointment for Monday to follow-up in person She will alert me if getting worse over the weekend This visit occurred during the SARS-CoV-2 public health emergency.  Safety protocols were in place, including screening questions prior to the visit, additional usage of staff PPE, and extensive cleaning of exam room while observing appropriate contact time as indicated for disinfecting solutions.     Signed Abbe Amsterdam, MD

## 2021-02-01 ENCOUNTER — Other Ambulatory Visit: Payer: Self-pay

## 2021-02-01 ENCOUNTER — Telehealth (INDEPENDENT_AMBULATORY_CARE_PROVIDER_SITE_OTHER): Payer: 59 | Admitting: Family Medicine

## 2021-02-01 DIAGNOSIS — R059 Cough, unspecified: Secondary | ICD-10-CM

## 2021-02-01 DIAGNOSIS — J189 Pneumonia, unspecified organism: Secondary | ICD-10-CM | POA: Diagnosis not present

## 2021-02-01 MED ORDER — HYDROCODONE-ACETAMINOPHEN 5-325 MG PO TABS: 0.5000 | ORAL_TABLET | Freq: Three times a day (TID) | ORAL | 0 refills | Status: AC | PRN

## 2021-02-03 NOTE — Progress Notes (Signed)
[facility] at [facility] [address] [place], [place] [address] [phone]  Date:  02/05/2021   Name:  Cassandra Levy   DOB:  22-Oct-1981   MRN:  [id number]  PCP:  Pearline Cables, MD    Chief Complaint: Pneumonia (Left upper lobe, feeling some improvement, still very tired/)   History of Present Illness:  Cassandra Levy is a 40 y.o. very pleasant female patient who presents with the following:  Patient following up today after recent bout with COVID-19 and then bacterial pneumonia We saw in the office on February 9, chest x-ray revealed pneumonia.  We started Levaquin Video visit on February 17-at that time she was still fatigued and coughing, somewhat improved but not yet feeling ready to return to work Cough keeping her awake at night.  Hydrocodone cough syrups are still on backorder, I called in 10 Vicodin to use for sleep.  This did help her sleep; her husband commented she is not coughing at night anymore  She finished off her abx about 5 days ago No fever noted Overall she feel like she is making progress  She may wheeze on occasion - this is more at night -she may notice a squeaking sound   She has actually been out of work since January 21, which is when she first became ill with Covid symptoms.  We discussed her return to work date.  She still feels that she is somewhat weak, we plan to have her return in 1 week from now  CXR 2/9: COMPARISON:  PA and lateral chest 05/21/2018. FINDINGS: Patchy airspace disease is seen in the left upper lobe. No pneumothorax or pleural effusion. Heart size is normal. No acute or focal bony abnormality.  IMPRESSION: Patchy airspace disease in left upper lobe most worrisome for pneumonia.  Patient Active Problem List   Diagnosis Date Noted  . Morbid obesity (HCC) 10/03/2015  . Hyperthyroidism 01/20/2014  . PCOS (polycystic ovarian syndrome) 12/13/2013  . Lower  extremity edema 12/03/2013  . Obesity 10/08/2012  . Goiter 06/27/2012  . Constipation 06/27/2012    Past Medical History:  Diagnosis Date  . Allergy   . Anemia   . Thyroid disease     Past Surgical History:  Procedure Laterality Date  . CHOLECYSTECTOMY    . COLPOSCOPY      Social History   Tobacco Use  . Smoking status: Never Smoker  . Smokeless tobacco: Never Used  Vaping Use  . Vaping Use: Never used  Substance Use Topics  . Alcohol use: No  . Drug use: No    Family History  Problem Relation Age of Onset  . Stroke Mother   . Leukemia Other     Allergies  Allergen Reactions  . Amoxicillin Swelling    Tightness in throat  . Bee Venom   . Cleocin [Clindamycin Hcl]     hives    Medication list has been reviewed and updated.  Current Outpatient Medications on File Prior to Visit  Medication Sig Dispense Refill  . Cholecalciferol (VITAMIN D3) 1.25 MG (50000 UT) CAPS Take 1 weekly for 12 weeks 12 capsule 0  . clotrimazole (CLOTRIMAZOLE AF) 1 % cream Apply 1 application topically 2 (two) times daily. 60 g 0  . EPINEPHrine (EPIPEN) 0.3 mg/0.3 mL SOAJ injection Inject 0.3 mLs (0.3 mg total) into the muscle once. 2 Device 2  . hydrochlorothiazide (HYDRODIURIL) 25 MG tablet TAKE 1-1.5 TABLETS (25-37.5 MG  TOTAL) BY MOUTH DAILY. 45 tablet 11  . HYDROcodone-acetaminophen (NORCO/VICODIN) 5-325 MG tablet Take 0.5-1 tablets by mouth every 8 (eight) hours as needed for up to 5 days. Use as needed for pain or cough 10 tablet 0  . levothyroxine (SYNTHROID) 75 MCG tablet Take 1 tablet (75 mcg total) by mouth daily before breakfast. 30 tablet 2  . metFORMIN (GLUCOPHAGE-XR) 750 MG 24 hr tablet Take 750 mg by mouth daily.    . norethindrone (AYGESTIN) 5 MG tablet Take 10 mg by mouth daily.      No current facility-administered medications on file prior to visit.    Review of Systems:  As per HPI- otherwise negative.   Physical Examination: Vitals:   02/05/21 1517  BP:  118/90  Pulse: (!) 55  Resp: 19  Temp: 98.3 F (36.8 C)  SpO2: 98%   Vitals:   02/05/21 1517  Weight: (!) 357 lb (161.9 kg)  Height: 5' 8.5" (1.74 m)   Body mass index is 53.49 kg/m. Ideal Body Weight: Weight in (lb) to have BMI = 25: 166.5  GEN: no acute distress.  Obese, otherwise looks well HEENT: Atraumatic, Normocephalic.  Ears and Nose: No external deformity. CV: RRR, No M/G/R. No JVD. No thrill. No extra heart sounds. PULM: CTA B, no wheezes, crackles, rhonchi. No retractions. No resp. distress. No accessory muscle use.  Lungs sound good today ABD: S, NT, ND, +BS. No rebound. No HSM. EXTR: No c/c/e PSYCH: Normally interactive. Conversant.  Occasional cough Assessment and Plan: Community acquired pneumonia of left upper lobe of lung - Plan: albuterol (VENTOLIN HFA) 108 (90 Base) MCG/ACT inhaler  Following up today from recent community-acquired pneumonia following COVID-19 infection. Tatyana notes that she is still somewhat tight in her chest, she may occasionally wheeze.  The cough is better.  She still feels tired and not ready to return to work.  We discussed using prednisone for a few days to help with her chest tightness.  She notes this medication may cause insomnia.  We plan to try 20 mg only in the morning for about 5 days, can stop early if need be I also prescribed albuterol to try for wheezing We can repeat a chest film in a few weeks if patient likes Plan return to work 1 week from today, sooner if able I have asked her to update me if not continuing to improve  This visit occurred during the SARS-CoV-2 public health emergency.  Safety protocols were in place, including screening questions prior to the visit, additional usage of staff PPE, and extensive cleaning of exam room while observing appropriate contact time as indicated for disinfecting solutions.     Signed Abbe Amsterdam, MD

## 2021-02-05 ENCOUNTER — Other Ambulatory Visit: Payer: Self-pay

## 2021-02-05 ENCOUNTER — Ambulatory Visit: Payer: 59 | Admitting: Family Medicine

## 2021-02-05 ENCOUNTER — Encounter: Payer: Self-pay | Admitting: Family Medicine

## 2021-02-05 VITALS — BP 118/90 | HR 55 | Temp 98.3°F | Resp 19 | Ht 68.5 in | Wt 357.0 lb

## 2021-02-05 DIAGNOSIS — J189 Pneumonia, unspecified organism: Secondary | ICD-10-CM | POA: Diagnosis not present

## 2021-02-05 MED ORDER — ALBUTEROL SULFATE HFA 108 (90 BASE) MCG/ACT IN AERS: 2.0000 | INHALATION_SPRAY | Freq: Four times a day (QID) | RESPIRATORY_TRACT | 0 refills | Status: DC | PRN

## 2021-02-05 NOTE — Patient Instructions (Addendum)
It was good to see you again today Try taking prednisone 20 mg daily (in the am) for about 5 days- ok to stop sooner if you are bothered by side effects Otherwise I would continue to rest and recover.  I do think you are getting better- please let me know if you don't continue to get better We can repeat a chest film in 4-6 weeks if you like Use the albuterol inhaler as needed for wheezing

## 2021-02-15 ENCOUNTER — Other Ambulatory Visit: Payer: Self-pay | Admitting: Family Medicine

## 2021-02-15 DIAGNOSIS — E039 Hypothyroidism, unspecified: Secondary | ICD-10-CM

## 2021-02-19 ENCOUNTER — Encounter: Payer: Self-pay | Admitting: Family Medicine

## 2021-03-09 ENCOUNTER — Other Ambulatory Visit: Payer: Self-pay | Admitting: Family Medicine

## 2021-03-09 DIAGNOSIS — E559 Vitamin D deficiency, unspecified: Secondary | ICD-10-CM

## 2021-05-11 ENCOUNTER — Other Ambulatory Visit: Payer: Self-pay | Admitting: Family Medicine

## 2021-05-11 DIAGNOSIS — E559 Vitamin D deficiency, unspecified: Secondary | ICD-10-CM

## 2021-12-23 NOTE — Patient Instructions (Addendum)
It was great to see you again today, I will be in touch with your labs Best of luck with baby making!    You got your first dose of hep B vaccine today- 2nd dose in 1 month I would recommend getting covid vaccinated due to dangers of serious covid disease during preganncy

## 2021-12-23 NOTE — Progress Notes (Addendum)
[place] at [facility] [address], [place], [place] [address] [phone]  Date:  12/24/2021   Name:  Cassandra Levy   DOB:  1981-09-13   MRN:  [id number]  PCP:  Darreld Mclean, MD    Chief Complaint: Annual Exam (Concerns/ questions: 1. pt says she is now on Vitamin C and Prenatal. 2. Pt hit her head Friday night, is still tender. 3. New puppy was just treated for worms. )   History of Present Illness:  Cassandra Levy is a 41 y.o. very pleasant female patient who presents with the following:  Patient seen today for physical exam History of obesity, hypothyroidism, PCOS, lower extremity edema They are working on becoming pregnant- they may soon do an IUI or other fertility procedures She just had her menstrual period so she is not concerned about current pregnancy She is keeping a steady weight  Wt Readings from Last 3 Encounters:  12/24/21 (!) 358 lb 9.6 oz (162.7 kg)  02/05/21 (!) 357 lb (161.9 kg)  01/24/21 (!) 351 lb (159.2 kg)   Most recent visit with myself was in February for follow-up of [id number] complicated by bacterial pneumonia  Pap screening- UTD per gyn Mammo- done  COVID-19 and flu immunization not done Most recent labs about 1 year ago  She is allergic to flu shot- localized redness and itching as well as malaise She is interested in getting hepatitis B series, she has been tested previously and found not immune Will give first dose of hepatitis B today I discussed COVID-19 vaccination, I really encouraged her to get this done especially before becoming pregnant.  She feels nervous given her history of reaction to flu shot in the past, I advised this should not carryover into a problem with COVID vaccination  She also notes that their puppy was recently diagnosed with roundworms.  I advised that should not be an issue for her, although certainly monitor stool for worms and watch for any sign of  anal itching  HCTZ-uses for lower extremity swelling Levothyroxine Metformin-per GYN for PCOS Patient Active Problem List   Diagnosis Date Noted   Morbid obesity ([place]) 10/03/2015   Hyperthyroidism 01/20/2014   PCOS (polycystic ovarian syndrome) 12/13/2013   Lower extremity edema 12/03/2013   Obesity 10/08/2012   Goiter 06/27/2012   Constipation 06/27/2012    Past Medical History:  Diagnosis Date   Allergy    Anemia    Thyroid disease     Past Surgical History:  Procedure Laterality Date   CHOLECYSTECTOMY     COLPOSCOPY      Social History   Tobacco Use   Smoking status: Never   Smokeless tobacco: Never  Vaping Use   Vaping Use: Never used  Substance Use Topics   Alcohol use: No   Drug use: No    Family History  Problem Relation Age of Onset   Stroke Mother    Leukemia Other     Allergies  Allergen Reactions   Amoxicillin Swelling    Tightness in throat   Bee Venom    Cleocin [Clindamycin Hcl]     hives    Medication list has been reviewed and updated.  Current Outpatient Medications on File Prior to Visit  Medication Sig Dispense Refill   Ascorbic Acid (VITAMIN C PO) Take by mouth.     clotrimazole (CLOTRIMAZOLE AF) 1 % cream Apply 1 application topically 2 (two) times daily. 60 g  0   EPINEPHrine (EPIPEN) 0.3 mg/0.3 mL SOAJ injection Inject 0.3 mLs (0.3 mg total) into the muscle once. 2 Device 2   hydrochlorothiazide (HYDRODIURIL) 25 MG tablet TAKE 1-1.5 TABLETS (25-37.5 MG TOTAL) BY MOUTH DAILY. 45 tablet 11   levothyroxine (SYNTHROID) 75 MCG tablet Take 1 tablet (75 mcg total) by mouth daily before breakfast. 90 tablet 3   metFORMIN (GLUCOPHAGE-XR) 750 MG 24 hr tablet Take 750 mg by mouth daily.     norethindrone (AYGESTIN) 5 MG tablet Take 10 mg by mouth daily.      Prenatal Vit-Fe Fumarate-FA (PRENATAL MULTIVITAMIN) TABS tablet Take 1 tablet by mouth daily at 12 noon.     No current facility-administered medications on file prior to visit.     Review of Systems:  As per HPI- otherwise negative.   Physical Examination: Vitals:   12/24/21 1500  BP: 124/80  Pulse: 83  Resp: 18  Temp: 98.3 F (36.8 C)  SpO2: 98%   Vitals:   12/24/21 1500  Weight: (!) 358 lb 9.6 oz (162.7 kg)  Height: 5\' 9"  (1.753 m)   Body mass index is 52.96 kg/m. Ideal Body Weight: Weight in (lb) to have BMI = 25: 168.9  GEN: no acute distress.  Obese, otherwise looks well HEENT: Atraumatic, Normocephalic. Bilateral TM wnl, oropharynx normal.  PEERL,EOMI.   Ears and Nose: No external deformity. CV: RRR, No M/G/R. No JVD. No thrill. No extra heart sounds. PULM: CTA B, no wheezes, crackles, rhonchi. No retractions. No resp. distress. No accessory muscle use. ABD: S, NT, ND, +BS. No rebound. No HSM. EXTR: No c/c/e PSYCH: Normally interactive. Conversant.    Assessment and Plan: Physical exam  Screening for hyperlipidemia - Plan: Lipid panel  Screening for diabetes mellitus - Plan: Comprehensive metabolic panel, Hemoglobin A1c  Screening for deficiency anemia - Plan: CBC  Encounter for vitamin deficiency screening - Plan: VITAMIN D 25 Hydroxy (Vit-D Deficiency, Fractures)  Acquired hypothyroidism - Plan: TSH, levothyroxine (SYNTHROID) 75 MCG tablet  Vitamin D deficiency - Plan: VITAMIN D 25 Hydroxy (Vit-D Deficiency, Fractures)  Immunization due - Plan: hepatitis b vaccine for adults (RECOMBIVAX-HB) injection 10 mcg, Heplisav-B (HepB-CPG) Vaccine  Lower extremity edema - Plan: hydrochlorothiazide (HYDRODIURIL) 25 MG tablet  Physical exam today.  Encouraged healthy diet and exercise routine Refilled her medications Will plan further follow- up pending labs. Gave first dose of hepatitis B vaccine- do not suspect pt will have any allergic or other reaction but asked her to let me know should this occur She was not fasting for labs today  Signed Abbe Amsterdam, MD  Addendum 1/10, received labs as below.  Message to  patient  Results for orders placed or performed in visit on 12/24/21  CBC  Result Value Ref Range   WBC 11.3 (H) 4.0 - 10.5 K/uL   RBC 4.78 3.87 - 5.11 Mil/uL   Platelets 398.0 150.0 - 400.0 K/uL   Hemoglobin 14.2 12.0 - 15.0 g/dL   HCT 72.6 20.3 - 55.9 %   MCV 89.9 78.0 - 100.0 fl   MCHC 32.9 30.0 - 36.0 g/dL   RDW 74.1 63.8 - 45.3 %  Comprehensive metabolic panel  Result Value Ref Range   Sodium 135 135 - 145 mEq/L   Potassium 4.2 3.5 - 5.1 mEq/L   Chloride 97 96 - 112 mEq/L   CO2 30 19 - 32 mEq/L   Glucose, Bld 81 70 - 99 mg/dL   BUN 12 6 - 23 mg/dL  Creatinine, Ser 0.79 0.40 - 1.20 mg/dL   Total Bilirubin 0.5 0.2 - 1.2 mg/dL   Alkaline Phosphatase 86 39 - 117 U/L   AST 13 0 - 37 U/L   ALT 15 0 - 35 U/L   Total Protein 7.3 6.0 - 8.3 g/dL   Albumin 4.3 3.5 - 5.2 g/dL   GFR 93.52 >60.00 mL/min   Calcium 9.7 8.4 - 10.5 mg/dL  Hemoglobin A1c  Result Value Ref Range   Hgb A1c MFr Bld 5.6 4.6 - 6.5 %  Lipid panel  Result Value Ref Range   Cholesterol 216 (H) 0 - 200 mg/dL   Triglycerides 78.0 0.0 - 149.0 mg/dL   HDL 44.60 >39.00 mg/dL   VLDL 15.6 0.0 - 40.0 mg/dL   LDL Cholesterol 156 (H) 0 - 99 mg/dL   Total CHOL/HDL Ratio 5    NonHDL 171.88   TSH  Result Value Ref Range   TSH 10.49 (H) 0.35 - 5.50 uIU/mL  VITAMIN D 25 Hydroxy (Vit-D Deficiency, Fractures)  Result Value Ref Range   VITD 21.23 (L) 30.00 - 100.00 ng/mL

## 2021-12-24 ENCOUNTER — Other Ambulatory Visit: Payer: Self-pay | Admitting: Family Medicine

## 2021-12-24 ENCOUNTER — Ambulatory Visit (INDEPENDENT_AMBULATORY_CARE_PROVIDER_SITE_OTHER): Payer: 59 | Admitting: Family Medicine

## 2021-12-24 VITALS — BP 124/80 | HR 83 | Temp 98.3°F | Resp 18 | Ht 69.0 in | Wt 358.6 lb

## 2021-12-24 DIAGNOSIS — R600 Localized edema: Secondary | ICD-10-CM

## 2021-12-24 DIAGNOSIS — Z1321 Encounter for screening for nutritional disorder: Secondary | ICD-10-CM

## 2021-12-24 DIAGNOSIS — Z131 Encounter for screening for diabetes mellitus: Secondary | ICD-10-CM | POA: Diagnosis not present

## 2021-12-24 DIAGNOSIS — Z13220 Encounter for screening for lipoid disorders: Secondary | ICD-10-CM | POA: Diagnosis not present

## 2021-12-24 DIAGNOSIS — E559 Vitamin D deficiency, unspecified: Secondary | ICD-10-CM | POA: Diagnosis not present

## 2021-12-24 DIAGNOSIS — E039 Hypothyroidism, unspecified: Secondary | ICD-10-CM | POA: Diagnosis not present

## 2021-12-24 DIAGNOSIS — Z0000 Encounter for general adult medical examination without abnormal findings: Secondary | ICD-10-CM

## 2021-12-24 DIAGNOSIS — Z23 Encounter for immunization: Secondary | ICD-10-CM | POA: Diagnosis not present

## 2021-12-24 DIAGNOSIS — Z130 Encounter for screening for diseases of the blood and blood-forming organs and certain disorders involving the immune mechanism: Secondary | ICD-10-CM | POA: Diagnosis not present

## 2021-12-24 MED ORDER — HYDROCHLOROTHIAZIDE 25 MG PO TABS: ORAL_TABLET | ORAL | 3 refills | Status: DC

## 2021-12-24 MED ORDER — HEPATITIS B VAC RECOMBINANT 10 MCG/ML IJ SUSP: 1.0000 mL | Freq: Once | INTRAMUSCULAR | Status: DC

## 2021-12-24 MED ORDER — LEVOTHYROXINE SODIUM 75 MCG PO TABS: 75.0000 ug | ORAL_TABLET | Freq: Every day | ORAL | 3 refills | Status: DC

## 2021-12-25 ENCOUNTER — Encounter: Payer: Self-pay | Admitting: Family Medicine

## 2021-12-25 LAB — CBC
HCT: 43 % (ref 36.0–46.0)
Hemoglobin: 14.2 g/dL (ref 12.0–15.0)
MCHC: 32.9 g/dL (ref 30.0–36.0)
MCV: 89.9 fl (ref 78.0–100.0)
Platelets: 398 10*3/uL (ref 150.0–400.0)
RBC: 4.78 Mil/uL (ref 3.87–5.11)
RDW: 13.8 % (ref 11.5–15.5)
WBC: 11.3 10*3/uL — ABNORMAL HIGH (ref 4.0–10.5)

## 2021-12-25 LAB — COMPREHENSIVE METABOLIC PANEL
ALT: 15 U/L (ref 0–35)
AST: 13 U/L (ref 0–37)
Albumin: 4.3 g/dL (ref 3.5–5.2)
Alkaline Phosphatase: 86 U/L (ref 39–117)
BUN: 12 mg/dL (ref 6–23)
CO2: 30 mEq/L (ref 19–32)
Calcium: 9.7 mg/dL (ref 8.4–10.5)
Chloride: 97 mEq/L (ref 96–112)
Creatinine, Ser: 0.79 mg/dL (ref 0.40–1.20)
GFR: 93.52 mL/min (ref >60.00)
Glucose, Bld: 81 mg/dL (ref 70–99)
Potassium: 4.2 mEq/L (ref 3.5–5.1)
Sodium: 135 mEq/L (ref 135–145)
Total Bilirubin: 0.5 mg/dL (ref 0.2–1.2)
Total Protein: 7.3 g/dL (ref 6.0–8.3)

## 2021-12-25 LAB — VITAMIN D 25 HYDROXY (VIT D DEFICIENCY, FRACTURES): VITD: 21.23 ng/mL — ABNORMAL LOW (ref 30.00–100.00)

## 2021-12-25 LAB — LIPID PANEL
Cholesterol: 216 mg/dL — ABNORMAL HIGH (ref 0–200)
HDL: 44.6 mg/dL (ref >39.00)
LDL Cholesterol: 156 mg/dL — ABNORMAL HIGH (ref 0–99)
NonHDL: 171.88
Total CHOL/HDL Ratio: 5
Triglycerides: 78 mg/dL (ref 0.0–149.0)
VLDL: 15.6 mg/dL (ref 0.0–40.0)

## 2021-12-25 LAB — HEMOGLOBIN A1C: Hgb A1c MFr Bld: 5.6 % (ref 4.6–6.5)

## 2021-12-25 LAB — TSH: TSH: 10.49 u[IU]/mL — ABNORMAL HIGH (ref 0.35–5.50)

## 2021-12-25 MED ORDER — LEVOTHYROXINE SODIUM 100 MCG PO TABS: 100.0000 ug | ORAL_TABLET | Freq: Every day | ORAL | 3 refills | Status: DC

## 2021-12-25 MED ORDER — VITAMIN D3 1.25 MG (50000 UT) PO CAPS: ORAL_CAPSULE | ORAL | 0 refills | Status: AC

## 2021-12-25 NOTE — Addendum Note (Signed)
Addended by: Lamar Blinks C on: 12/25/2021 12:53 PM   Modules accepted: Orders

## 2022-01-22 ENCOUNTER — Ambulatory Visit: Payer: 59 | Admitting: Family Medicine

## 2022-01-22 ENCOUNTER — Encounter: Payer: Self-pay | Admitting: Family Medicine

## 2022-01-22 ENCOUNTER — Other Ambulatory Visit (INDEPENDENT_AMBULATORY_CARE_PROVIDER_SITE_OTHER): Payer: 59

## 2022-01-22 VITALS — BP 142/77 | HR 74 | Temp 98.1°F | Ht 69.0 in | Wt 363.4 lb

## 2022-01-22 DIAGNOSIS — Z23 Encounter for immunization: Secondary | ICD-10-CM | POA: Diagnosis not present

## 2022-01-22 DIAGNOSIS — E039 Hypothyroidism, unspecified: Secondary | ICD-10-CM | POA: Diagnosis not present

## 2022-01-22 DIAGNOSIS — J0140 Acute pansinusitis, unspecified: Secondary | ICD-10-CM | POA: Diagnosis not present

## 2022-01-22 MED ORDER — AZITHROMYCIN 250 MG PO TABS: ORAL_TABLET | ORAL | 0 refills | Status: DC

## 2022-01-22 MED ORDER — FLUTICASONE PROPIONATE 50 MCG/ACT NA SUSP: 2.0000 | Freq: Every day | NASAL | 0 refills | Status: DC

## 2022-01-22 NOTE — Progress Notes (Signed)
Acute Office Visit  Subjective:    Patient ID: Cassandra Levy, female    DOB: [birth year], 41 y.o.   MRN: [id number]  CC: sinusitis   HPI Patient is in today for sinusitis  Patient reports that she has had 2 to 3 weeks sinus pressure, postnasal drainage, nasal congestion, productive cough, mild dyspnea with exertion due to chest congestion.  She had a negative home COVID test today.  She denies any fevers, chills, chest pain, wheezing, GI/GU symptoms, rashes.  She takes daily Claritin and vitamins but this has not been helping.   Not currently pregnant, but trying to conceive  LMP 01/03/22, no intercourse this cycle, not possibly pregnant     Past Medical History:  Diagnosis Date   Allergy    Anemia    Thyroid disease     Past Surgical History:  Procedure Laterality Date   CHOLECYSTECTOMY     COLPOSCOPY      Family History  Problem Relation Age of Onset   Stroke Mother    Leukemia Other     Social History   Socioeconomic History   Marital status: Married    Spouse name: Not on file   Number of children: 0   Years of education: Not on file   Highest education level: Not on file  Occupational History   Not on file  Tobacco Use   Smoking status: Never   Smokeless tobacco: Never  Vaping Use   Vaping Use: Never used  Substance and Sexual Activity   Alcohol use: No   Drug use: No   Sexual activity: Yes  Other Topics Concern   Not on file  Social History Narrative   Not on file   Social Determinants of Health   Financial Resource Strain: Not on file  Food Insecurity: Not on file  Transportation Needs: Not on file  Physical Activity: Not on file  Stress: Not on file  Social Connections: Not on file  Intimate Partner Violence: Not on file    Outpatient Medications Prior to Visit  Medication Sig Dispense Refill   Ascorbic Acid (VITAMIN C PO) Take by mouth.     Cholecalciferol (VITAMIN D3) 1.25 MG (50000 UT) CAPS Take 1 weekly for 12 weeks 12  capsule 0   clotrimazole (CLOTRIMAZOLE AF) 1 % cream Apply 1 application topically 2 (two) times daily. 60 g 0   EPINEPHrine (EPIPEN) 0.3 mg/0.3 mL SOAJ injection Inject 0.3 mLs (0.3 mg total) into the muscle once. 2 Device 2   hydrochlorothiazide (HYDRODIURIL) 25 MG tablet TAKE 1-1.5 TABLETS (25-37.5 MG TOTAL) BY MOUTH DAILY. 135 tablet 3   levothyroxine (SYNTHROID) 100 MCG tablet Take 1 tablet (100 mcg total) by mouth daily before breakfast. 30 tablet 3   metFORMIN (GLUCOPHAGE-XR) 750 MG 24 hr tablet Take 750 mg by mouth daily.     norethindrone (AYGESTIN) 5 MG tablet Take 10 mg by mouth daily.      Prenatal Vit-Fe Fumarate-FA (PRENATAL MULTIVITAMIN) TABS tablet Take 1 tablet by mouth daily at 12 noon.     No facility-administered medications prior to visit.    Allergies  Allergen Reactions   Amoxicillin Swelling    Tightness in throat   Bee Venom    Cleocin [Clindamycin Hcl]     hives    Review of Systems All review of systems negative except what is listed in the HPI     Objective:    Physical Exam Vitals reviewed.  Constitutional:  Appearance: Normal appearance. She is obese.  HENT:     Head: Normocephalic and atraumatic.     Right Ear: Tympanic membrane normal.     Left Ear: Tympanic membrane normal.     Nose: Congestion present.     Mouth/Throat:     Mouth: Mucous membranes are moist.     Pharynx: Oropharynx is clear. No oropharyngeal exudate or posterior oropharyngeal erythema.  Cardiovascular:     Rate and Rhythm: Normal rate and regular rhythm.  Pulmonary:     Effort: Pulmonary effort is normal.     Breath sounds: Normal breath sounds. No wheezing, rhonchi or rales.  Musculoskeletal:     Cervical back: Normal range of motion and neck supple. No tenderness.  Lymphadenopathy:     Cervical: No cervical adenopathy.  Skin:    General: Skin is warm and dry.  Neurological:     General: No focal deficit present.     Mental Status: She is alert and oriented to  person, place, and time. Mental status is at baseline.  Psychiatric:        Mood and Affect: Mood normal.        Behavior: Behavior normal.        Thought Content: Thought content normal.        Judgment: Judgment normal.    BP (!) 142/77    Pulse 74    Temp 98.1 F (36.7 C)    Ht 5\' 9"  (1.753 m)    Wt (!) 363 lb 6.4 oz (164.8 kg)    SpO2 99%    BMI 53.66 kg/m  Wt Readings from Last 3 Encounters:  01/22/22 (!) 363 lb 6.4 oz (164.8 kg)  12/24/21 (!) 358 lb 9.6 oz (162.7 kg)  02/05/21 (!) 357 lb (161.9 kg)    Health Maintenance Due  Topic Date Due   HIV Screening  Never done   PAP SMEAR-Modifier  06/28/2015    There are no preventive care reminders to display for this patient.   Lab Results  Component Value Date   TSH 10.49 (H) 12/24/2021   Lab Results  Component Value Date   WBC 11.3 (H) 12/24/2021   HGB 14.2 12/24/2021   HCT 43.0 12/24/2021   MCV 89.9 12/24/2021   PLT 398.0 12/24/2021   Lab Results  Component Value Date   NA 135 12/24/2021   K 4.2 12/24/2021   CO2 30 12/24/2021   GLUCOSE 81 12/24/2021   BUN 12 12/24/2021   CREATININE 0.79 12/24/2021   BILITOT 0.5 12/24/2021   ALKPHOS 86 12/24/2021   AST 13 12/24/2021   ALT 15 12/24/2021   PROT 7.3 12/24/2021   ALBUMIN 4.3 12/24/2021   CALCIUM 9.7 12/24/2021   ANIONGAP 10 05/27/2015   GFR 93.52 12/24/2021   Lab Results  Component Value Date   CHOL 216 (H) 12/24/2021   Lab Results  Component Value Date   HDL 44.60 12/24/2021   Lab Results  Component Value Date   LDLCALC 156 (H) 12/24/2021   Lab Results  Component Value Date   TRIG 78.0 12/24/2021   Lab Results  Component Value Date   CHOLHDL 5 12/24/2021   Lab Results  Component Value Date   HGBA1C 5.6 12/24/2021       Assessment & Plan:   1. Acute non-recurrent pansinusitis Start Zpak (patient preference), Flonase, Mucinex.  Continue supportive measures including rest, hydration, humidifier use, steam showers, warm compresses to  sinuses, warm liquids with lemon and honey, and over-the-counter  cough, cold, and analgesics as needed.   Patient aware of signs/symptoms requiring further/urgent evaluation.   - azithromycin (ZITHROMAX Z-PAK) 250 MG tablet; Take 2 tablets (500 mg) on  Day 1,  followed by 1 tablet (250 mg) once daily on Days 2 through 5.  Dispense: 6 tablet; Refill: 0 - fluticasone (FLONASE) 50 MCG/ACT nasal spray; Place 2 sprays into both nostrils daily.  Dispense: 1 g; Refill: 0  2. Need for hepatitis B vaccination - Heplisav-B (HepB-CPG) Vaccine   Follow-up if symptoms worsen or fail to improve.   Clayborne Dana, NP

## 2022-01-22 NOTE — Patient Instructions (Addendum)
Start Zpak, Flonase, Mucinex.  Continue supportive measures including rest, hydration, humidifier use, steam showers, warm compresses to sinuses, warm liquids with lemon and honey, and over-the-counter cough, cold, and analgesics as needed.

## 2022-01-23 ENCOUNTER — Ambulatory Visit: Payer: 59

## 2022-01-23 ENCOUNTER — Encounter: Payer: Self-pay | Admitting: Family Medicine

## 2022-01-23 DIAGNOSIS — E039 Hypothyroidism, unspecified: Secondary | ICD-10-CM

## 2022-01-23 LAB — TSH: TSH: 5.25 u[IU]/mL (ref 0.35–5.50)

## 2022-01-23 MED ORDER — LEVOTHYROXINE SODIUM 112 MCG PO TABS: 112.0000 ug | ORAL_TABLET | Freq: Every day | ORAL | 3 refills | Status: DC

## 2022-02-27 ENCOUNTER — Ambulatory Visit (HOSPITAL_BASED_OUTPATIENT_CLINIC_OR_DEPARTMENT_OTHER)
Admission: RE | Admit: 2022-02-27 | Discharge: 2022-02-27 | Disposition: A | Discharge status: Home / Self Care | Payer: 59 | Source: Ambulatory Visit | Attending: Family Medicine | Admitting: Family Medicine

## 2022-02-27 ENCOUNTER — Other Ambulatory Visit: Payer: Self-pay

## 2022-02-27 ENCOUNTER — Encounter: Payer: Self-pay | Admitting: Family Medicine

## 2022-02-27 ENCOUNTER — Ambulatory Visit: Payer: 59 | Admitting: Family Medicine

## 2022-02-27 VITALS — BP 128/84 | HR 85 | Temp 98.4°F | Ht 69.0 in | Wt 373.0 lb

## 2022-02-27 DIAGNOSIS — R042 Hemoptysis: Secondary | ICD-10-CM | POA: Diagnosis present

## 2022-02-27 MED ORDER — PREDNISONE 20 MG PO TABS: 40.0000 mg | ORAL_TABLET | Freq: Every day | ORAL | 0 refills | Status: DC

## 2022-02-27 MED ORDER — BENZONATATE 200 MG PO CAPS
200.0000 mg | ORAL_CAPSULE | Freq: Two times a day (BID) | ORAL | 0 refills | Status: DC | PRN
Start: 2022-02-27 — End: 2022-03-26

## 2022-02-27 MED ORDER — DOXYCYCLINE HYCLATE 100 MG PO TABS: 100.0000 mg | ORAL_TABLET | Freq: Two times a day (BID) | ORAL | 0 refills | Status: AC

## 2022-02-27 MED ORDER — ALBUTEROL SULFATE HFA 108 (90 BASE) MCG/ACT IN AERS: 2.0000 | INHALATION_SPRAY | Freq: Four times a day (QID) | RESPIRATORY_TRACT | 11 refills | Status: DC | PRN

## 2022-02-27 NOTE — Progress Notes (Signed)
? ?Acute Office Visit ? ?Subjective:  ? ? Patient ID: Cassandra Levy, female    DOB: 1981/01/14, 41 y.o.   MRN: [id number] ? ?CC: cough ? ? ?Cough ?The current episode started 1 to 4 weeks ago (initially started with feeling fatigued, short of breath, then a few days later cough started). The problem has been gradually worsening. The problem occurs hourly. The cough is Productive of blood-tinged sputum. Associated symptoms include chills, hemoptysis, nasal congestion, postnasal drip, rhinorrhea, a sore throat, shortness of breath and wheezing. Pertinent negatives include no chest pain, ear congestion, ear pain, fever, headaches, heartburn, myalgias, rash, sweats or weight loss. Associated symptoms comments: Sputum is yellow with blood, chest is sore, throat feels raw, cough is very hard/forceful. Nothing aggravates the symptoms. Treatments tried: cough drops, The treatment provided mild relief. Her past medical history is significant for pneumonia.  ? ? ? ?Past Medical History:  ?Diagnosis Date  ? Allergy   ? Anemia   ? Thyroid disease   ? ? ?Past Surgical History:  ?Procedure Laterality Date  ? CHOLECYSTECTOMY    ? COLPOSCOPY    ? ? ?Family History  ?Problem Relation Age of Onset  ? Stroke Mother   ? Leukemia Other   ? ? ?Social History  ? ?Socioeconomic History  ? Marital status: Married  ?  Spouse name: Not on file  ? Number of children: 0  ? Years of education: Not on file  ? Highest education level: Not on file  ?Occupational History  ? Not on file  ?Tobacco Use  ? Smoking status: Never  ? Smokeless tobacco: Never  ?Vaping Use  ? Vaping Use: Never used  ?Substance and Sexual Activity  ? Alcohol use: No  ? Drug use: No  ? Sexual activity: Yes  ?Other Topics Concern  ? Not on file  ?Social History Narrative  ? Not on file  ? ?Social Determinants of Health  ? ?Financial Resource Strain: Not on file  ?Food Insecurity: Not on file  ?Transportation Needs: Not on file  ?Physical Activity: Not on file  ?Stress: Not  on file  ?Social Connections: Not on file  ?Intimate Partner Violence: Not on file  ? ? ?Outpatient Medications Prior to Visit  ?Medication Sig Dispense Refill  ? Ascorbic Acid (VITAMIN C PO) Take by mouth.    ? azithromycin (ZITHROMAX Z-PAK) 250 MG tablet Take 2 tablets (500 mg) on  Day 1,  followed by 1 tablet (250 mg) once daily on Days 2 through 5. 6 tablet 0  ? Cholecalciferol (VITAMIN D3) 1.25 MG (50000 UT) CAPS Take 1 weekly for 12 weeks 12 capsule 0  ? clotrimazole (CLOTRIMAZOLE AF) 1 % cream Apply 1 application topically 2 (two) times daily. 60 g 0  ? EPINEPHrine (EPIPEN) 0.3 mg/0.3 mL SOAJ injection Inject 0.3 mLs (0.3 mg total) into the muscle once. 2 Device 2  ? fluticasone (FLONASE) 50 MCG/ACT nasal spray Place 2 sprays into both nostrils daily. 1 g 0  ? hydrochlorothiazide (HYDRODIURIL) 25 MG tablet TAKE 1-1.5 TABLETS (25-37.5 MG TOTAL) BY MOUTH DAILY. 135 tablet 3  ? levothyroxine (SYNTHROID) 112 MCG tablet Take 1 tablet (112 mcg total) by mouth daily before breakfast. 90 tablet 3  ? metFORMIN (GLUCOPHAGE-XR) 750 MG 24 hr tablet Take 750 mg by mouth daily.    ? norethindrone (AYGESTIN) 5 MG tablet Take 10 mg by mouth daily.     ? Prenatal Vit-Fe Fumarate-FA (PRENATAL MULTIVITAMIN) TABS tablet Take 1 tablet by mouth  daily at 12 noon.    ? ?No facility-administered medications prior to visit.  ? ? ?Allergies  ?Allergen Reactions  ? Amoxicillin Swelling  ?  Tightness in throat  ? Bee Venom   ? Cleocin [Clindamycin Hcl]   ?  hives  ? ? ?Review of Systems ?All review of systems negative except what is listed in the HPI ? ? ?   ?Objective:  ?  ?Physical Exam ?Vitals reviewed.  ?Constitutional:   ?   Appearance: Normal appearance.  ?HENT:  ?   Head: Normocephalic and atraumatic.  ?   Right Ear: Tympanic membrane normal.  ?   Left Ear: Tympanic membrane normal.  ?   Nose: Congestion present.  ?   Mouth/Throat:  ?   Mouth: Mucous membranes are moist.  ?   Pharynx: Oropharynx is clear.  ?Eyes:  ?    Conjunctiva/sclera: Conjunctivae normal.  ?Cardiovascular:  ?   Rate and Rhythm: Normal rate and regular rhythm.  ?Pulmonary:  ?   Effort: Pulmonary effort is normal.  ?   Comments: Slightly diminished ?Musculoskeletal:  ?   Cervical back: Normal range of motion and neck supple.  ?Skin: ?   General: Skin is warm and dry.  ?Neurological:  ?   General: No focal deficit present.  ?   Mental Status: She is alert and oriented to person, place, and time. Mental status is at baseline.  ?Psychiatric:     ?   Mood and Affect: Mood normal.     ?   Behavior: Behavior normal.     ?   Thought Content: Thought content normal.     ?   Judgment: Judgment normal.  ? ? ?There were no vitals taken for this visit. ?Wt Readings from Last 3 Encounters:  ?01/22/22 (!) 363 lb 6.4 oz (164.8 kg)  ?12/24/21 (!) 358 lb 9.6 oz (162.7 kg)  ?02/05/21 (!) 357 lb (161.9 kg)  ? ? ?Health Maintenance Due  ?Topic Date Due  ? HIV Screening  Never done  ? PAP SMEAR-Modifier  06/28/2015  ? ? ?There are no preventive care reminders to display for this patient. ? ? ?Lab Results  ?Component Value Date  ? TSH 5.25 01/22/2022  ? ?Lab Results  ?Component Value Date  ? WBC 11.3 (H) 12/24/2021  ? HGB 14.2 12/24/2021  ? HCT 43.0 12/24/2021  ? MCV 89.9 12/24/2021  ? PLT 398.0 12/24/2021  ? ?Lab Results  ?Component Value Date  ? NA 135 12/24/2021  ? K 4.2 12/24/2021  ? CO2 30 12/24/2021  ? GLUCOSE 81 12/24/2021  ? BUN 12 12/24/2021  ? CREATININE 0.79 12/24/2021  ? BILITOT 0.5 12/24/2021  ? ALKPHOS 86 12/24/2021  ? AST 13 12/24/2021  ? ALT 15 12/24/2021  ? PROT 7.3 12/24/2021  ? ALBUMIN 4.3 12/24/2021  ? CALCIUM 9.7 12/24/2021  ? ANIONGAP 10 05/27/2015  ? GFR 93.52 12/24/2021  ? ?Lab Results  ?Component Value Date  ? CHOL 216 (H) 12/24/2021  ? ?Lab Results  ?Component Value Date  ? HDL 44.60 12/24/2021  ? ?Lab Results  ?Component Value Date  ? LDLCALC 156 (H) 12/24/2021  ? ?Lab Results  ?Component Value Date  ? TRIG 78.0 12/24/2021  ? ?Lab Results  ?Component Value  Date  ? CHOLHDL 5 12/24/2021  ? ?Lab Results  ?Component Value Date  ? HGBA1C 5.6 12/24/2021  ? ? ?   ?Assessment & Plan:  ? ?1. Cough with hemoptysis ?Coricidin OTC for cough/cold medicine to  prevent your blood pressure from rising like it may with other cold medicines  ?Continue supportive measures including rest, hydration, humidifier use, steam showers, warm compresses to sinuses, warm liquids with lemon and honey, and over-the-counter cough, cold, and analgesics as needed.   ?Xray today ?Start antibiotics, steroids, and use inhaler as needed  ? ?- DG Chest 2 View; Future ?- benzonatate (TESSALON) 200 MG capsule; Take 1 capsule (200 mg total) by mouth 2 (two) times daily as needed for cough.  Dispense: 20 capsule; Refill: 0 ?- albuterol (VENTOLIN HFA) 108 (90 Base) MCG/ACT inhaler; Inhale 2 puffs into the lungs every 6 (six) hours as needed for wheezing.  Dispense: 2 each; Refill: 11 ?- predniSONE (DELTASONE) 20 MG tablet; Take 2 tablets (40 mg total) by mouth daily with breakfast for 5 days.  Dispense: 10 tablet; Refill: 0 ?- doxycycline (VIBRA-TABS) 100 MG tablet; Take 1 tablet (100 mg total) by mouth 2 (two) times daily for 7 days.  Dispense: 14 tablet; Refill: 0 ? ? ?Please contact office for follow-up if symptoms do not improve or worsen. Seek emergency care if symptoms become severe. ? ? ?Clayborne Dana, NP ? ?

## 2022-02-27 NOTE — Patient Instructions (Addendum)
Coricidin OTC for cough/cold medicine to prevent your blood pressure from rising like it may with other cold medicines  ?Continue supportive measures including rest, hydration, humidifier use, steam showers, warm compresses to sinuses, warm liquids with lemon and honey, and over-the-counter cough, cold, and analgesics as needed.   ?Xray today ?Start antibiotics, steroids, and use inhaler as needed  ?

## 2022-02-28 ENCOUNTER — Encounter: Payer: Self-pay | Admitting: Family Medicine

## 2022-03-01 ENCOUNTER — Telehealth: Payer: Self-pay | Admitting: Family Medicine

## 2022-03-01 NOTE — Telephone Encounter (Signed)
Pt would like to review imaging results order by Caleen Jobs  ? ? ?Pt stated that she has had additional symps which included low grade fever 100.  ? ? ?Please advise  ?

## 2022-03-01 NOTE — Telephone Encounter (Signed)
Waiting on radiologist to read xray. ?

## 2022-03-04 ENCOUNTER — Encounter: Payer: Self-pay | Admitting: Family Medicine

## 2022-03-04 ENCOUNTER — Ambulatory Visit: Payer: 59 | Admitting: Family Medicine

## 2022-03-04 DIAGNOSIS — J0140 Acute pansinusitis, unspecified: Secondary | ICD-10-CM

## 2022-03-04 MED ORDER — DOXYCYCLINE HYCLATE 100 MG PO TABS: 100.0000 mg | ORAL_TABLET | Freq: Two times a day (BID) | ORAL | 0 refills | Status: AC

## 2022-03-04 MED ORDER — FLUTICASONE PROPIONATE 50 MCG/ACT NA SUSP: 2.0000 | Freq: Every day | NASAL | 5 refills | Status: DC

## 2022-03-04 NOTE — Patient Instructions (Signed)
Adding 3 extra days of antibiotics to get you to 10 days total.  ?Continue supportive measures including rest, hydration, humidifier use (cool mist), steam showers, warm compresses to sinuses, warm liquids with lemon and honey, and over-the-counter cough, cold, and analgesics as needed.   ?

## 2022-03-04 NOTE — Progress Notes (Signed)
? ?Acute Office Visit ? ?Subjective:  ? ? Patient ID: Cassandra Levy, female    DOB: 1981-10-20, 41 y.o.   MRN: [id number] ? ?CC: cough ? ? ?HPI ?Patient is in today for ongoing cough/URI. ? ?Cassandra Levy reports she continues to have cough, though sputum is no longer bloody, but still yellow/green. Reports temperature has been ranging from 99.0-100.75F nightly for the past several nights, and then usually normal by morning. States this makes her very tired. The [employer] have been working well, but make her too sleepy to take during the day. States she is feeling significantly better, but isn't sure that she is well-enough to be done with the antibiotics in 2 days since many of her symptoms are still present. She was supposed to go back to work today, but didn't feel up to it, plus reports the low-grade fevers overnight. Flonase has helped quite a bit, but she is still having ethmoid and frontal sinus pressure.  ?She denies any chest pain, dyspnea, wheezing, lethargy, rashes, hemoptysis.  ? ? ? ?Past Medical History:  ?Diagnosis Date  ? Allergy   ? Anemia   ? Thyroid disease   ? ? ?Past Surgical History:  ?Procedure Laterality Date  ? CHOLECYSTECTOMY    ? COLPOSCOPY    ? ? ?Family History  ?Problem Relation Age of Onset  ? Stroke Mother   ? Leukemia Other   ? ? ?Social History  ? ?Socioeconomic History  ? Marital status: Married  ?  Spouse name: Not on file  ? Number of children: 0  ? Years of education: Not on file  ? Highest education level: Not on file  ?Occupational History  ? Not on file  ?Tobacco Use  ? Smoking status: Never  ? Smokeless tobacco: Never  ?Vaping Use  ? Vaping Use: Never used  ?Substance and Sexual Activity  ? Alcohol use: No  ? Drug use: No  ? Sexual activity: Yes  ?Other Topics Concern  ? Not on file  ?Social History Narrative  ? Not on file  ? ?Social Determinants of Health  ? ?Financial Resource Strain: Not on file  ?Food Insecurity: Not on file  ?Transportation Needs: Not on file   ?Physical Activity: Not on file  ?Stress: Not on file  ?Social Connections: Not on file  ?Intimate Partner Violence: Not on file  ? ? ?Outpatient Medications Prior to Visit  ?Medication Sig Dispense Refill  ? albuterol (VENTOLIN HFA) 108 (90 Base) MCG/ACT inhaler Inhale 2 puffs into the lungs every 6 (six) hours as needed for wheezing. 2 each 11  ? Ascorbic Acid (VITAMIN C PO) Take by mouth.    ? benzonatate (TESSALON) 200 MG capsule Take 1 capsule (200 mg total) by mouth 2 (two) times daily as needed for cough. 20 capsule 0  ? Cholecalciferol (VITAMIN D3) 1.25 MG (50000 UT) CAPS Take 1 weekly for 12 weeks 12 capsule 0  ? clotrimazole (CLOTRIMAZOLE AF) 1 % cream Apply 1 application topically 2 (two) times daily. 60 g 0  ? doxycycline (VIBRA-TABS) 100 MG tablet Take 1 tablet (100 mg total) by mouth 2 (two) times daily for 7 days. 14 tablet 0  ? EPINEPHrine (EPIPEN) 0.3 mg/0.3 mL SOAJ injection Inject 0.3 mLs (0.3 mg total) into the muscle once. 2 Device 2  ? fluticasone (FLONASE) 50 MCG/ACT nasal spray Place 2 sprays into both nostrils daily. 1 g 0  ? hydrochlorothiazide (HYDRODIURIL) 25 MG tablet TAKE 1-1.5 TABLETS (25-37.5 MG TOTAL) BY MOUTH DAILY.  135 tablet 3  ? levothyroxine (SYNTHROID) 112 MCG tablet Take 1 tablet (112 mcg total) by mouth daily before breakfast. 90 tablet 3  ? metFORMIN (GLUCOPHAGE-XR) 750 MG 24 hr tablet Take 750 mg by mouth daily.    ? norethindrone (AYGESTIN) 5 MG tablet Take 10 mg by mouth daily.     ? Prenatal Vit-Fe Fumarate-FA (PRENATAL MULTIVITAMIN) TABS tablet Take 1 tablet by mouth daily at 12 noon.    ? predniSONE (DELTASONE) 20 MG tablet Take 2 tablets (40 mg total) by mouth daily with breakfast for 5 days. 10 tablet 0  ? ?No facility-administered medications prior to visit.  ? ? ?Allergies  ?Allergen Reactions  ? Amoxicillin Swelling  ?  Tightness in throat  ? Bee Venom   ? Cleocin [Clindamycin Hcl]   ?  hives  ? ? ?Review of Systems ?All review of systems negative except what is  listed in the HPI ? ?   ?Objective:  ?  ?Physical Exam ?Vitals reviewed.  ?Constitutional:   ?   Appearance: Normal appearance. She is obese.  ?HENT:  ?   Head: Normocephalic and atraumatic.  ?   Nose: Nose normal.  ?   Mouth/Throat:  ?   Pharynx: No posterior oropharyngeal erythema.  ?   Comments: Mild drainage/cobblestoning ?Cardiovascular:  ?   Rate and Rhythm: Normal rate and regular rhythm.  ?Pulmonary:  ?   Effort: Pulmonary effort is normal.  ?   Breath sounds: Normal breath sounds. No wheezing, rhonchi or rales.  ?Skin: ?   General: Skin is warm and dry.  ?Neurological:  ?   General: No focal deficit present.  ?   Mental Status: She is alert and oriented to person, place, and time.  ?Psychiatric:     ?   Mood and Affect: Mood normal.     ?   Thought Content: Thought content normal.     ?   Judgment: Judgment normal.  ? ? ?BP 136/81   Pulse 86   Temp 97.9 ?F (36.6 ?C)   Ht 5\' 9"  (1.753 m)   Wt (!) 373 lb (169.2 kg)   LMP 02/27/2022   SpO2 99%   BMI 55.08 kg/m?  ?Wt Readings from Last 3 Encounters:  ?03/04/22 (!) 373 lb (169.2 kg)  ?02/27/22 (!) 373 lb (169.2 kg)  ?01/22/22 (!) 363 lb 6.4 oz (164.8 kg)  ? ? ?Health Maintenance Due  ?Topic Date Due  ? HIV Screening  Never done  ? PAP SMEAR-Modifier  06/28/2015  ? ? ?There are no preventive care reminders to display for this patient. ? ? ?Lab Results  ?Component Value Date  ? TSH 5.25 01/22/2022  ? ?Lab Results  ?Component Value Date  ? WBC 11.3 (H) 12/24/2021  ? HGB 14.2 12/24/2021  ? HCT 43.0 12/24/2021  ? MCV 89.9 12/24/2021  ? PLT 398.0 12/24/2021  ? ?Lab Results  ?Component Value Date  ? NA 135 12/24/2021  ? K 4.2 12/24/2021  ? CO2 30 12/24/2021  ? GLUCOSE 81 12/24/2021  ? BUN 12 12/24/2021  ? CREATININE 0.79 12/24/2021  ? BILITOT 0.5 12/24/2021  ? ALKPHOS 86 12/24/2021  ? AST 13 12/24/2021  ? ALT 15 12/24/2021  ? PROT 7.3 12/24/2021  ? ALBUMIN 4.3 12/24/2021  ? CALCIUM 9.7 12/24/2021  ? ANIONGAP 10 05/27/2015  ? GFR 93.52 12/24/2021  ? ?Lab Results   ?Component Value Date  ? CHOL 216 (H) 12/24/2021  ? ?Lab Results  ?Component Value Date  ?  HDL 44.60 12/24/2021  ? ?Lab Results  ?Component Value Date  ? LDLCALC 156 (H) 12/24/2021  ? ?Lab Results  ?Component Value Date  ? TRIG 78.0 12/24/2021  ? ?Lab Results  ?Component Value Date  ? CHOLHDL 5 12/24/2021  ? ?Lab Results  ?Component Value Date  ? HGBA1C 5.6 12/24/2021  ? ? ?   ?Assessment & Plan:  ? ?1. Acute non-recurrent pansinusitis ?Adding 3 extra days of antibiotics to get you to a total of 10 days. Refilling your Flonase. Continue supportive measures including rest, hydration, humidifier use, steam showers, warm compresses to sinuses, warm liquids with lemon and honey, and over-the-counter cough, cold, and analgesics as needed.   ?Patient aware of signs/symptoms requiring further/urgent evaluation.  ? ?- doxycycline (VIBRA-TABS) 100 MG tablet; Take 1 tablet (100 mg total) by mouth 2 (two) times daily for 3 days.  Dispense: 6 tablet; Refill: 0 ?- fluticasone (FLONASE) 50 MCG/ACT nasal spray; Place 2 sprays into both nostrils daily.  Dispense: 1 g; Refill: 5 ? ?Please contact office for follow-up if symptoms do not improve or worsen. Seek emergency care if symptoms become severe. ? ? ?Clayborne Dana, NP ? ?

## 2022-03-26 ENCOUNTER — Encounter: Payer: Self-pay | Admitting: Family Medicine

## 2022-03-26 ENCOUNTER — Other Ambulatory Visit (INDEPENDENT_AMBULATORY_CARE_PROVIDER_SITE_OTHER): Payer: 59

## 2022-03-26 ENCOUNTER — Ambulatory Visit: Payer: 59 | Admitting: Family Medicine

## 2022-03-26 VITALS — BP 137/74 | HR 89 | Ht 69.0 in | Wt 364.2 lb

## 2022-03-26 DIAGNOSIS — M79662 Pain in left lower leg: Secondary | ICD-10-CM

## 2022-03-26 DIAGNOSIS — E039 Hypothyroidism, unspecified: Secondary | ICD-10-CM | POA: Diagnosis not present

## 2022-03-26 NOTE — Progress Notes (Signed)
? ?Acute Office Visit ? ?Subjective:  ? ? Patient ID: Cassandra Levy, female    DOB: [birth year], 41 y.o.   MRN: [id number] ? ?Chief Complaint  ?Patient presents with  ? left calf pain  ? ? ?HPI ?Patient is in today for left calf pain.  ? ?Patient reports about 6 days of left lower calf pain.  States she cannot remember what she was doing when she first noticed it but does not recall any injuries.  She initially thought it may have been a cramp however since it has persisted she went to get it checked out.  States that yesterday she did feel it pop while walking around at work.  Has been causing her to limp since then.  She denies any recent strenuous activity, just walking is much as usual at work.  States that she has been getting some improvement with rest, ibuprofen, massage.  By the end of the day the pain is worse, up to 5/10.  Pain is most significant with plantar flexion. She denies any swelling, redness, recent travel or sitting for long periods of time.  No history of blood clots. ? ? ? ? ?Past Medical History:  ?Diagnosis Date  ? Allergy   ? Anemia   ? Thyroid disease   ? ? ?Past Surgical History:  ?Procedure Laterality Date  ? CHOLECYSTECTOMY    ? COLPOSCOPY    ? ? ?Family History  ?Problem Relation Age of Onset  ? Stroke Mother   ? Leukemia Other   ? ? ?Social History  ? ?Socioeconomic History  ? Marital status: Married  ?  Spouse name: Not on file  ? Number of children: 0  ? Years of education: Not on file  ? Highest education level: Not on file  ?Occupational History  ? Not on file  ?Tobacco Use  ? Smoking status: Never  ? Smokeless tobacco: Never  ?Vaping Use  ? Vaping Use: Never used  ?Substance and Sexual Activity  ? Alcohol use: No  ? Drug use: No  ? Sexual activity: Yes  ?Other Topics Concern  ? Not on file  ?Social History Narrative  ? Not on file  ? ?Social Determinants of Health  ? ?Financial Resource Strain: Not on file  ?Food Insecurity: Not on file  ?Transportation Needs: Not on file   ?Physical Activity: Not on file  ?Stress: Not on file  ?Social Connections: Not on file  ?Intimate Partner Violence: Not on file  ? ? ?Outpatient Medications Prior to Visit  ?Medication Sig Dispense Refill  ? albuterol (VENTOLIN HFA) 108 (90 Base) MCG/ACT inhaler Inhale 2 puffs into the lungs every 6 (six) hours as needed for wheezing. 2 each 11  ? Ascorbic Acid (VITAMIN C PO) Take by mouth.    ? Cholecalciferol (VITAMIN D3) 1.25 MG (50000 UT) CAPS Take 1 weekly for 12 weeks 12 capsule 0  ? clotrimazole (CLOTRIMAZOLE AF) 1 % cream Apply 1 application topically 2 (two) times daily. 60 g 0  ? EPINEPHrine (EPIPEN) 0.3 mg/0.3 mL SOAJ injection Inject 0.3 mLs (0.3 mg total) into the muscle once. 2 Device 2  ? fluticasone (FLONASE) 50 MCG/ACT nasal spray Place 2 sprays into both nostrils daily. 1 g 5  ? hydrochlorothiazide (HYDRODIURIL) 25 MG tablet TAKE 1-1.5 TABLETS (25-37.5 MG TOTAL) BY MOUTH DAILY. 135 tablet 3  ? levothyroxine (SYNTHROID) 112 MCG tablet Take 1 tablet (112 mcg total) by mouth daily before breakfast. 90 tablet 3  ? metFORMIN (GLUCOPHAGE-XR) 750 MG  24 hr tablet Take 750 mg by mouth daily.    ? norethindrone (AYGESTIN) 5 MG tablet Take 10 mg by mouth daily.     ? Prenatal Vit-Fe Fumarate-FA (PRENATAL MULTIVITAMIN) TABS tablet Take 1 tablet by mouth daily at 12 noon.    ? benzonatate (TESSALON) 200 MG capsule Take 1 capsule (200 mg total) by mouth 2 (two) times daily as needed for cough. 20 capsule 0  ? ?No facility-administered medications prior to visit.  ? ? ?Allergies  ?Allergen Reactions  ? Amoxicillin Swelling  ?  Tightness in throat  ? Bee Venom   ? Cleocin [Clindamycin Hcl]   ?  hives  ? ? ?Review of Systems ?All review of systems negative except what is listed in the HPI ? ?   ?Objective:  ?  ?Physical Exam ?Vitals reviewed.  ?Constitutional:   ?   Appearance: Normal appearance.  ?HENT:  ?   Head: Normocephalic and atraumatic.  ?Musculoskeletal:     ?   General: No swelling. Normal range of  motion.  ?   Comments: Pain to deep palpation of left lower calf, no erythema, edema, negative Homans sign   ?Skin: ?   General: Skin is warm and dry.  ?Neurological:  ?   General: No focal deficit present.  ?   Mental Status: She is alert and oriented to person, place, and time. Mental status is at baseline.  ?Psychiatric:     ?   Mood and Affect: Mood normal.     ?   Behavior: Behavior normal.     ?   Thought Content: Thought content normal.     ?   Judgment: Judgment normal.  ? ? ?BP 137/74   Pulse 89   Ht 5\' 9"  (1.753 m)   Wt (!) 364 lb 3.2 oz (165.2 kg)   LMP 02/27/2022   BMI 53.78 kg/m?  ?Wt Readings from Last 3 Encounters:  ?03/26/22 (!) 364 lb 3.2 oz (165.2 kg)  ?03/04/22 (!) 373 lb (169.2 kg)  ?02/27/22 (!) 373 lb (169.2 kg)  ? ? ?Health Maintenance Due  ?Topic Date Due  ? HIV Screening  Never done  ? PAP SMEAR-Modifier  06/28/2015  ? ? ?There are no preventive care reminders to display for this patient. ? ? ?Lab Results  ?Component Value Date  ? TSH 5.25 01/22/2022  ? ?Lab Results  ?Component Value Date  ? WBC 11.3 (H) 12/24/2021  ? HGB 14.2 12/24/2021  ? HCT 43.0 12/24/2021  ? MCV 89.9 12/24/2021  ? PLT 398.0 12/24/2021  ? ?Lab Results  ?Component Value Date  ? NA 135 12/24/2021  ? K 4.2 12/24/2021  ? CO2 30 12/24/2021  ? GLUCOSE 81 12/24/2021  ? BUN 12 12/24/2021  ? CREATININE 0.79 12/24/2021  ? BILITOT 0.5 12/24/2021  ? ALKPHOS 86 12/24/2021  ? AST 13 12/24/2021  ? ALT 15 12/24/2021  ? PROT 7.3 12/24/2021  ? ALBUMIN 4.3 12/24/2021  ? CALCIUM 9.7 12/24/2021  ? ANIONGAP 10 05/27/2015  ? GFR 93.52 12/24/2021  ? ?Lab Results  ?Component Value Date  ? CHOL 216 (H) 12/24/2021  ? ?Lab Results  ?Component Value Date  ? HDL 44.60 12/24/2021  ? ?Lab Results  ?Component Value Date  ? LDLCALC 156 (H) 12/24/2021  ? ?Lab Results  ?Component Value Date  ? TRIG 78.0 12/24/2021  ? ?Lab Results  ?Component Value Date  ? CHOLHDL 5 12/24/2021  ? ?Lab Results  ?Component Value Date  ? HGBA1C 5.6  12/24/2021  ? ? ?    ?Assessment & Plan:  ? ?1. Pain of left calf ?D-dimer to be cautious, but sounds more like a strain. Continue ibuprofen for pain/inflammation, rest, ice, compression, elevation. Home exercises - handout provided. Consider PT if not improving.  ? ?Patient aware of signs/symptoms requiring further/urgent evaluation.  ? ?- D-Dimer, Quantitative ? ?Please contact office for follow-up if symptoms do not improve or worsen. Seek emergency care if symptoms become severe. ? ? ?Clayborne Danaaylor B Waldine Zenz, NP ? ?

## 2022-03-26 NOTE — Patient Instructions (Signed)
D-dimer to be cautious, but sounds more like a strain. Continue ibuprofen for pain/inflammation, rest, ice, compression, elevation. Home exercises - handout provided. Consider PT if not improving.  ? ?Please contact office for follow-up if symptoms do not improve or worsen. Seek emergency care if symptoms become severe. ? ?

## 2022-03-27 ENCOUNTER — Encounter: Payer: Self-pay | Admitting: Family Medicine

## 2022-03-27 LAB — D-DIMER, QUANTITATIVE: D-Dimer, Quant: 0.35 mcg/mL FEU (ref <0.50)

## 2022-03-27 LAB — TSH: TSH: 3.18 u[IU]/mL (ref 0.35–5.50)

## 2022-04-13 ENCOUNTER — Other Ambulatory Visit: Payer: Self-pay | Admitting: Family Medicine

## 2022-04-13 DIAGNOSIS — E559 Vitamin D deficiency, unspecified: Secondary | ICD-10-CM

## 2022-06-12 IMAGING — DX DG CHEST 2V
2 series · 2 of 2 positions shown · non-contrast
Comparison: PA and lateral chest 05/21/2018.

CLINICAL DATA: Patient diagnosed with 5ADU3-AY 01/05/2021.
Continued cough and fatigue.

EXAM:
CHEST - 2 VIEW

[chest pa]
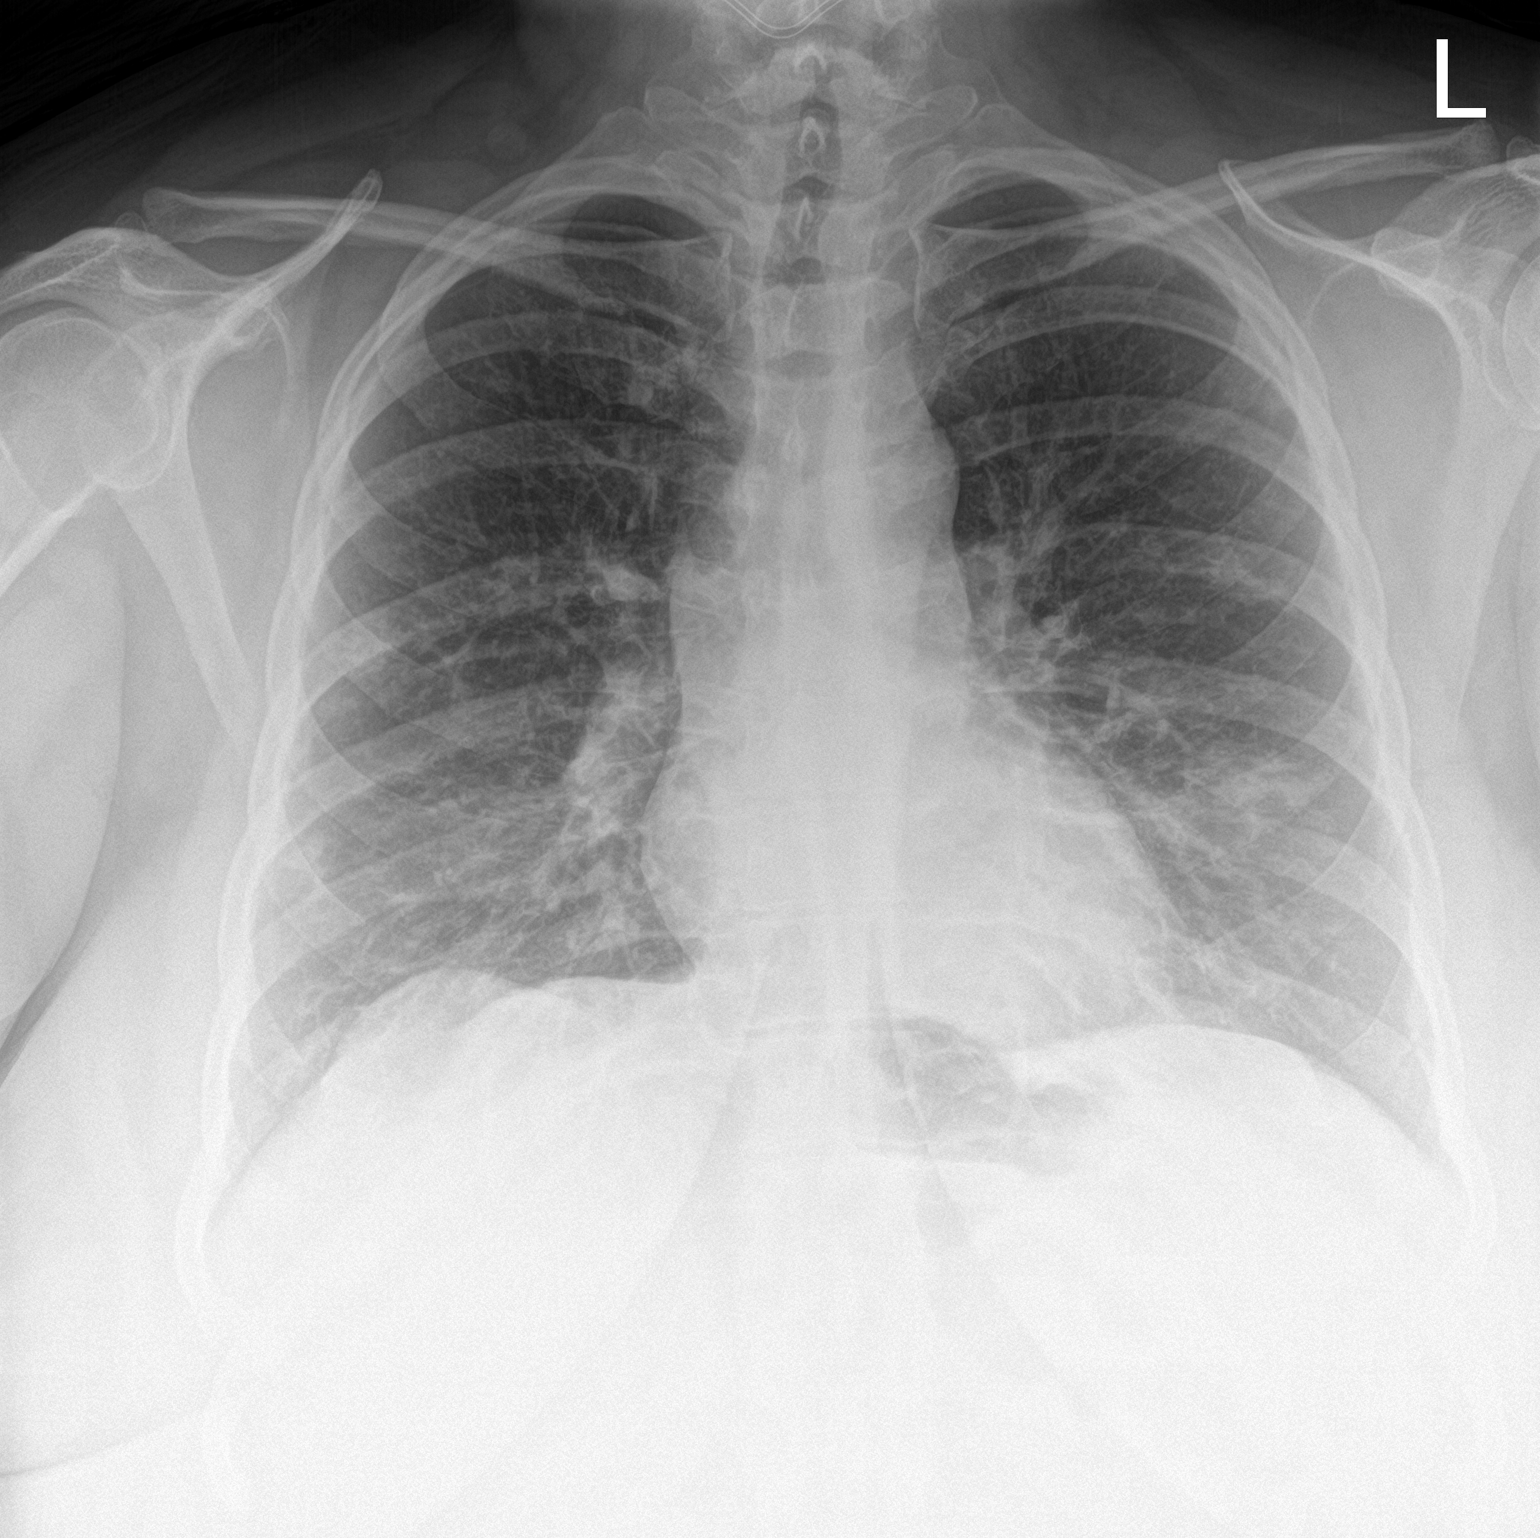

[chest lat]
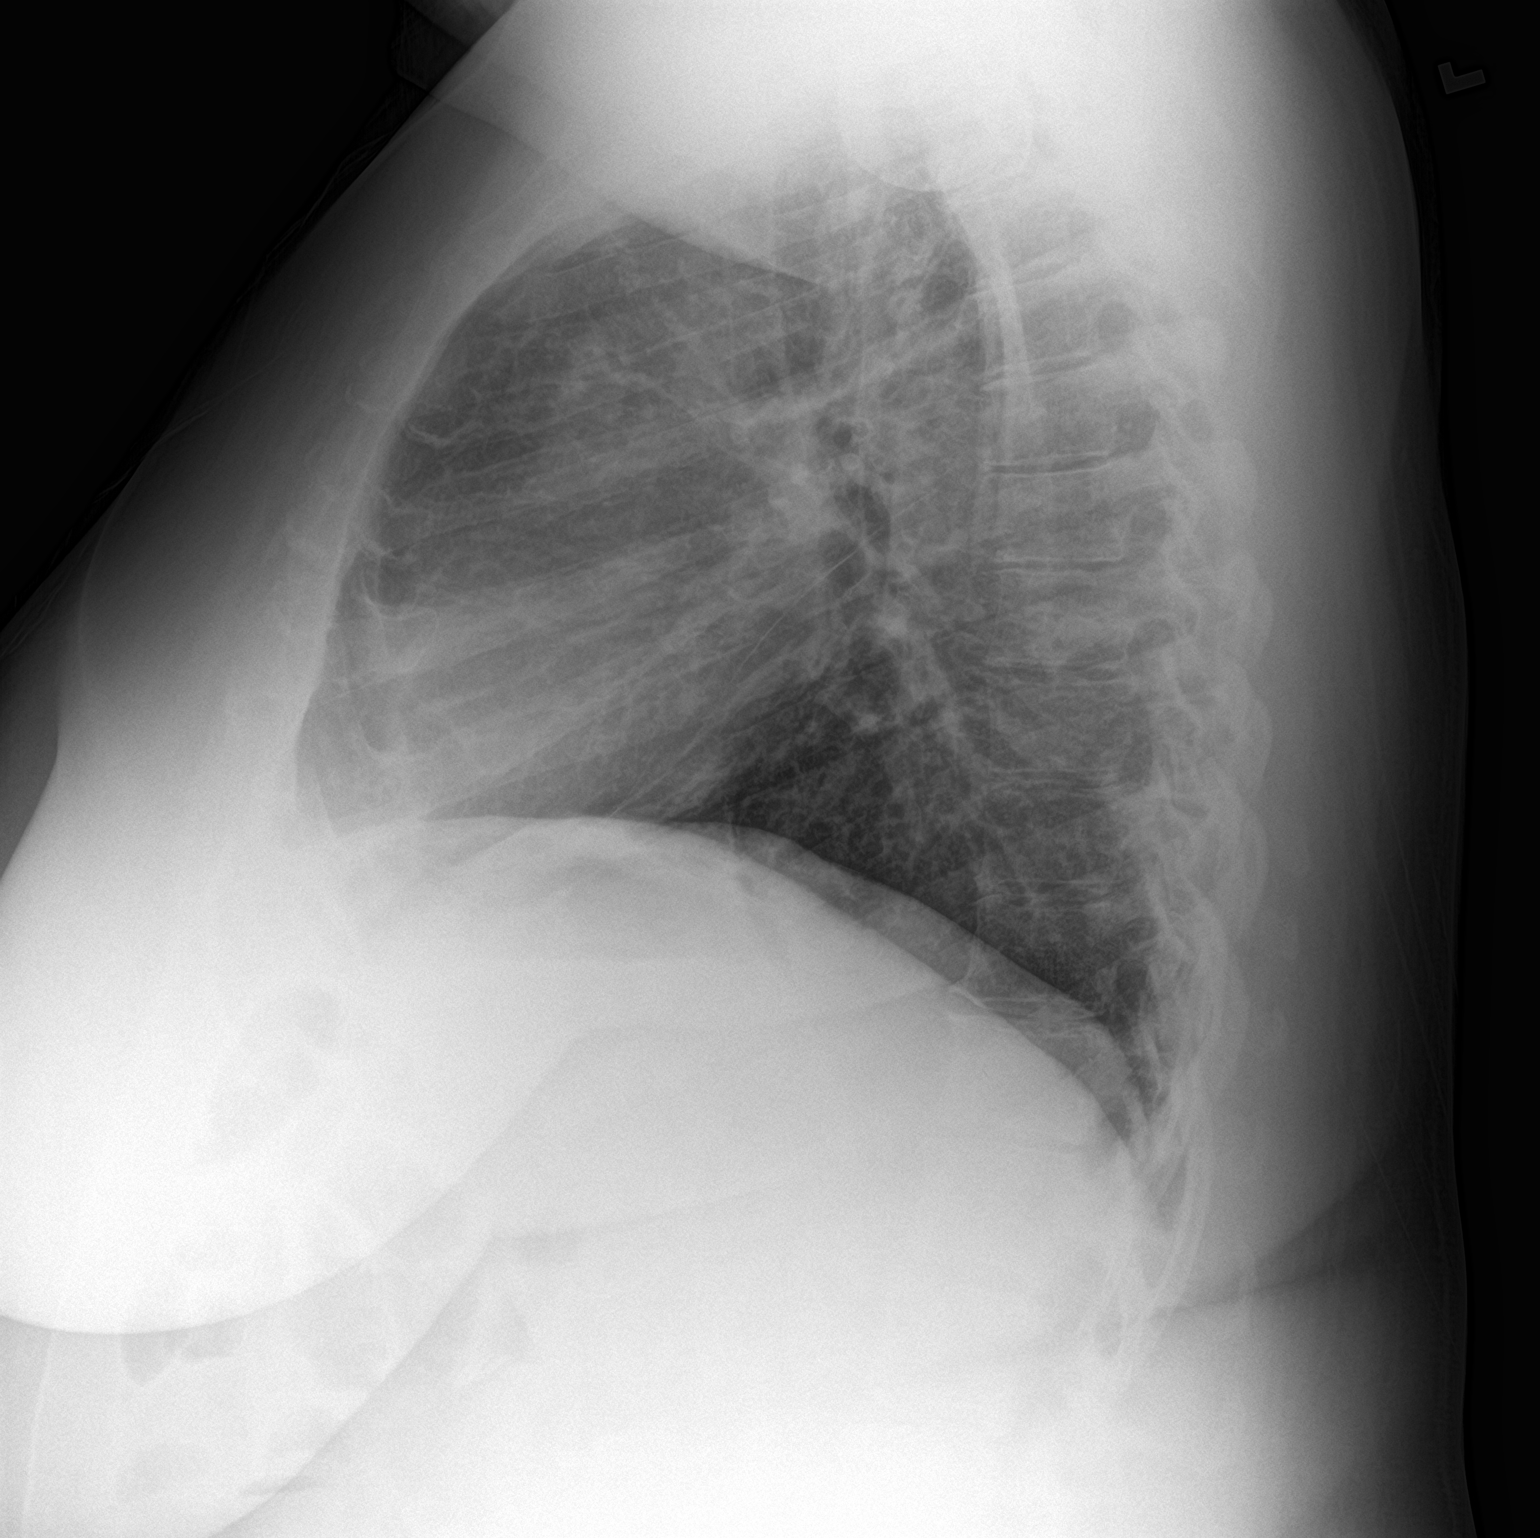

[2 of 2 positions shown; findings below may reference images not displayed]

FINDINGS: Patchy airspace disease is seen in the left upper lobe. No
pneumothorax or pleural effusion. Heart size is normal. No acute or
focal bony abnormality.
IMPRESSION: Patchy airspace disease in left upper lobe most worrisome for
pneumonia.

## 2022-08-07 ENCOUNTER — Ambulatory Visit: Payer: Self-pay

## 2022-08-07 ENCOUNTER — Ambulatory Visit: Payer: 59 | Admitting: Family Medicine

## 2022-08-07 ENCOUNTER — Encounter: Payer: Self-pay | Admitting: Family Medicine

## 2022-08-07 VITALS — BP 140/90 | Ht 69.0 in | Wt 358.0 lb

## 2022-08-07 DIAGNOSIS — M65912 Unspecified synovitis and tenosynovitis, left shoulder: Secondary | ICD-10-CM | POA: Insufficient documentation

## 2022-08-07 DIAGNOSIS — M25512 Pain in left shoulder: Secondary | ICD-10-CM

## 2022-08-07 DIAGNOSIS — M65812 Other synovitis and tenosynovitis, left shoulder: Secondary | ICD-10-CM

## 2022-08-07 NOTE — Assessment & Plan Note (Signed)
Acutely occurring.  Clinical exam suggest more of a synovitis and capsulitis.  She does have a bursitis but pain does not seem to associate with that on exam. -Counseled on home exercise therapy and supportive care. -ANA, sed rate and CRP. -Counseled on ibuprofen. -Could consider injection or physical therapy.

## 2022-08-07 NOTE — Patient Instructions (Signed)
Nice to meet you Please continue heat  I will call with the lab results.  Please try the exercises  Please continue the ibuprofen   Please send me a message in MyChart with any questions or updates.  Please see me back in 4 weeks.   --Dr. Jordan Likes

## 2022-08-07 NOTE — Progress Notes (Signed)
  Cassandra Levy - 41 y.o. female MRN [id number]  Date of birth: Oct 11, 1981  SUBJECTIVE:  Including CC & ROS.  No chief complaint on file.   Cassandra Levy is a 41 y.o. female that is presenting with acute left shoulder pain.  The pain is anterior nature.  The pain was severe and limiting her range of motion.  Has slowly started improving with the ibuprofen and Tylenol.  No history of similar pain.    Review of Systems See HPI   HISTORY: Past Medical, Surgical, Social, and Family History Reviewed & Updated per EMR.   Pertinent Historical Findings include:  Past Medical History:  Diagnosis Date   Allergy    Anemia    Thyroid disease     Past Surgical History:  Procedure Laterality Date   CHOLECYSTECTOMY     COLPOSCOPY       PHYSICAL EXAM:  VS: BP (!) 140/90 (BP Location: Right Arm, Patient Position: Sitting)   Ht 5\' 9"  (1.753 m)   Wt (!) 358 lb (162.4 kg)   BMI 52.87 kg/m  Physical Exam Gen: NAD, alert, cooperative with exam, well-appearing MSK:  Neurovascularly intact    Limited ultrasound: Left shoulder:  No changes appreciated biceps tendon sheath. Normal-appearing subscapularis. Subacromial bursitis appreciated. No changes in the posterior glenohumeral joint.  Summary: Subacromial bursitis appreciated  Ultrasound and interpretation by , MD    ASSESSMENT & PLAN:   Synovitis of left shoulder Acutely occurring.  Clinical exam suggest more of a synovitis and capsulitis.  She does have a bursitis but pain does not seem to associate with that on exam. -Counseled on home exercise therapy and supportive care. -ANA, sed rate and CRP. -Counseled on ibuprofen. -Could consider injection or physical therapy.

## 2022-08-09 LAB — ANA,IFA RA DIAG PNL W/RFLX TIT/PATN
ANA Titer 1: NEGATIVE
Cyclic Citrullin Peptide Ab: <1 units (ref 0–19)
Rheumatoid fact SerPl-aCnc: <10 IU/mL (ref <14.0)

## 2022-08-09 LAB — SEDIMENTATION RATE: Sed Rate: 34 mm/hr — ABNORMAL HIGH (ref 0–32)

## 2022-08-09 LAB — C-REACTIVE PROTEIN: CRP: 8 mg/L (ref 0–10)

## 2022-08-12 ENCOUNTER — Telehealth: Payer: Self-pay | Admitting: Family Medicine

## 2022-08-12 NOTE — Telephone Encounter (Signed)
Informed of results.   Myra Rude, MD [facility] 08/12/2022, 4:53 PM

## 2022-09-11 ENCOUNTER — Encounter: Payer: Self-pay | Admitting: Family Medicine

## 2022-09-11 ENCOUNTER — Ambulatory Visit: Payer: 59 | Admitting: Family Medicine

## 2022-09-11 VITALS — BP 138/90 | Ht 69.0 in | Wt 358.0 lb

## 2022-09-11 DIAGNOSIS — M23301 Other meniscus derangements, unspecified lateral meniscus, left knee: Secondary | ICD-10-CM | POA: Diagnosis not present

## 2022-09-11 DIAGNOSIS — M65812 Other synovitis and tenosynovitis, left shoulder: Secondary | ICD-10-CM | POA: Diagnosis not present

## 2022-09-11 NOTE — Assessment & Plan Note (Signed)
Doing well with the modalities put in place.  Has good range of motion and strength today. -Counseled on home exercise therapy and supportive care. -Could consider injection or further imaging.

## 2022-09-11 NOTE — Progress Notes (Signed)
  Cassandra Levy - 41 y.o. female MRN [id number]  Date of birth: 1981/07/22  SUBJECTIVE:  Including CC & ROS.  No chief complaint on file.   Cassandra Levy is a 41 y.o. female that is following up for her left shoulder presenting with new onset knee pain.  Shoulder has been doing well.  She has acute intermittent lateral left knee pain.  It does give way from time to time.   Review of Systems See HPI   HISTORY: Past Medical, Surgical, Social, and Family History Reviewed & Updated per EMR.   Pertinent Historical Findings include:  Past Medical History:  Diagnosis Date   Allergy    Anemia    Thyroid disease     Past Surgical History:  Procedure Laterality Date   CHOLECYSTECTOMY     COLPOSCOPY       PHYSICAL EXAM:  VS: BP (!) 138/90 (BP Location: Right Arm, Patient Position: Sitting)   Ht 5\' 9"  (1.753 m)   Wt (!) 358 lb (162.4 kg)   BMI 52.87 kg/m  Physical Exam Gen: NAD, alert, cooperative with exam, well-appearing MSK:  Neurovascularly intact       ASSESSMENT & PLAN:   Synovitis of left shoulder Doing well with the modalities put in place.  Has good range of motion and strength today. -Counseled on home exercise therapy and supportive care. -Could consider injection or further imaging.  Degenerative tear of lateral meniscus of left knee Acutely occurring.  Notices pain and pain intermittently.  More on the lateral aspect of the knee -Counseled on home exercise therapy and supportive care. -Could consider custom orthotics, injection or physical therapy.

## 2022-09-11 NOTE — Assessment & Plan Note (Signed)
Acutely occurring.  Notices pain and pain intermittently.  More on the lateral aspect of the knee -Counseled on home exercise therapy and supportive care. -Could consider custom orthotics, injection or physical therapy.

## 2022-09-11 NOTE — Patient Instructions (Signed)
Good to see you Please use ice as needed for the knee  Please try the exercises   Please send me a message in MyChart with any questions or updates.  Please see me back if you would like to try custom orthotics. .   --Dr. Raeford Razor

## 2022-11-29 ENCOUNTER — Ambulatory Visit: Payer: 59 | Admitting: Family Medicine

## 2022-11-29 ENCOUNTER — Encounter: Payer: Self-pay | Admitting: Family Medicine

## 2022-11-29 VITALS — BP 138/86 | HR 90 | Temp 97.9°F | Ht 69.0 in | Wt 368.6 lb

## 2022-11-29 DIAGNOSIS — J069 Acute upper respiratory infection, unspecified: Secondary | ICD-10-CM | POA: Diagnosis not present

## 2022-11-29 LAB — POCT INFLUENZA A/B
Influenza A, POC: NEGATIVE
Influenza B, POC: NEGATIVE

## 2022-11-29 LAB — POC COVID19 BINAXNOW: SARS Coronavirus 2 Ag: NEGATIVE

## 2022-11-29 NOTE — Progress Notes (Signed)
[facility] [address] [place] [address] Dept: [phone] Dept Fax: [phone]  Office Visit  Subjective:    Patient ID: Cassandra Levy, female    DOB: 01/24/1981, 41 y.o..   MRN: [id number]  Chief Complaint  Patient presents with   Acute Visit    Co having cough, congestion, fatigue, ears itching, ST x 3 days.  No home covid test.   Has been taking Coricidin HBP.      History of Present Illness:  Patient is in today for with a 3 day history of cough, congestion, fatigue, sore throat and laryngitis. She has been taking some Coricidin, with mild relief. Her husband has been sick for the past 9 days. She has no history of asthma and is not a smoker.  Past Medical History: Patient Active Problem List   Diagnosis Date Noted   Degenerative tear of lateral meniscus of left knee 09/11/2022   Synovitis of left shoulder 08/07/2022   Morbid obesity ([place]) 10/03/2015   Hyperthyroidism 01/20/2014   PCOS (polycystic ovarian syndrome) 12/13/2013   Lower extremity edema 12/03/2013   Obesity 10/08/2012   Goiter 06/27/2012   Constipation 06/27/2012   Past Surgical History:  Procedure Laterality Date   CHOLECYSTECTOMY     COLPOSCOPY     Family History  Problem Relation Age of Onset   Stroke Mother    Leukemia Other    Outpatient Medications Prior to Visit  Medication Sig Dispense Refill   Ascorbic Acid (VITAMIN C PO) Take by mouth.     Cholecalciferol (VITAMIN D3) 1.25 MG (50000 UT) CAPS Take 1 weekly for 12 weeks 12 capsule 0   clotrimazole (CLOTRIMAZOLE AF) 1 % cream Apply 1 application topically 2 (two) times daily. 60 g 0   EPINEPHrine (EPIPEN) 0.3 mg/0.3 mL SOAJ injection Inject 0.3 mLs (0.3 mg total) into the muscle once. 2 Device 2   hydrochlorothiazide (HYDRODIURIL) 25 MG tablet TAKE 1-1.5 TABLETS (25-37.5 MG TOTAL) BY MOUTH DAILY. 135 tablet 3   levothyroxine (SYNTHROID) 112 MCG tablet Take 1 tablet (112 mcg  total) by mouth daily before breakfast. 90 tablet 3   metFORMIN (GLUCOPHAGE-XR) 750 MG 24 hr tablet Take 750 mg by mouth daily.     norethindrone (AYGESTIN) 5 MG tablet Take 10 mg by mouth daily.      Prenatal Vit-Fe Fumarate-FA (PRENATAL MULTIVITAMIN) TABS tablet Take 1 tablet by mouth daily at 12 noon.     albuterol (VENTOLIN HFA) 108 (90 Base) MCG/ACT inhaler Inhale 2 puffs into the lungs every 6 (six) hours as needed for wheezing. 2 each 11   fluticasone (FLONASE) 50 MCG/ACT nasal spray Place 2 sprays into both nostrils daily. 1 g 5   No facility-administered medications prior to visit.   Allergies  Allergen Reactions   Amoxicillin Swelling    Tightness in throat   Bee Venom    Cleocin [Clindamycin Hcl]     hives     Objective:   Today's Vitals   11/29/22 1057  BP: 138/86  Pulse: 90  Temp: 97.9 F (36.6 C)  TempSrc: Temporal  SpO2: 97%  Weight: (!) 368 lb 9.6 oz (167.2 kg)  Height: 5\' 9"  (1.753 m)   Body mass index is 54.43 kg/m.   General: Well developed, well nourished. No acute distress. HEENT: Normocephalic, non-traumatic. Eyes clear. External ears normal. EAC and TMs normal   bilaterally. Nose clear without congestion or rhinorrhea. Mucous membranes moist. Oropharynx with mild   streaking  and redness. Good dentition. Neck: Supple. No lymphadenopathy. No thyromegaly. Lungs: Clear to auscultation bilaterally. No wheezing, rales or rhonchi. Psych: Alert and oriented. Normal mood and affect.  Health Maintenance Due  Topic Date Due   HIV Screening  Never done   PAP SMEAR-Modifier  06/28/2015   Lab Results POCT COVID: Neg. POCT Influenza A&B: Neg.    Assessment & Plan:   1. Viral URI with cough Discussed home care for viral illness, including rest, pushing fluids, and OTC medications as needed for symptom relief. May continue Coricidin 1-2 times a day. Recommend hot tea with honey for sore throat symptoms. Follow-up if needed for worsening or persistent  symptoms.  - POC COVID-19 - POCT Influenza A/B  Return if symptoms worsen or fail to improve.   Loyola Mast, MD

## 2022-12-02 ENCOUNTER — Ambulatory Visit: Payer: 59 | Admitting: Family Medicine

## 2022-12-02 ENCOUNTER — Encounter: Payer: Self-pay | Admitting: Family Medicine

## 2022-12-02 VITALS — BP 131/84 | HR 91 | Temp 99.1°F | Ht 69.0 in | Wt 368.9 lb

## 2022-12-02 DIAGNOSIS — R051 Acute cough: Secondary | ICD-10-CM

## 2022-12-02 DIAGNOSIS — J329 Chronic sinusitis, unspecified: Secondary | ICD-10-CM | POA: Diagnosis not present

## 2022-12-02 DIAGNOSIS — J40 Bronchitis, not specified as acute or chronic: Secondary | ICD-10-CM

## 2022-12-02 MED ORDER — GUAIFENESIN ER 600 MG PO TB12: 1200.0000 mg | ORAL_TABLET | Freq: Two times a day (BID) | ORAL | 1 refills | Status: DC

## 2022-12-02 MED ORDER — DOXYCYCLINE HYCLATE 100 MG PO TABS: 100.0000 mg | ORAL_TABLET | Freq: Two times a day (BID) | ORAL | 0 refills | Status: AC

## 2022-12-02 MED ORDER — FLUTICASONE PROPIONATE 50 MCG/ACT NA SUSP: 2.0000 | Freq: Every day | NASAL | 6 refills | Status: DC

## 2022-12-02 MED ORDER — BENZONATATE 200 MG PO CAPS: 200.0000 mg | ORAL_CAPSULE | Freq: Two times a day (BID) | ORAL | 0 refills | Status: DC | PRN

## 2022-12-02 MED ORDER — HYDROCOD POLI-CHLORPHE POLI ER 10-8 MG/5ML PO SUER: 5.0000 mL | Freq: Two times a day (BID) | ORAL | 0 refills | Status: DC | PRN

## 2022-12-02 MED ORDER — ALBUTEROL SULFATE HFA 108 (90 BASE) MCG/ACT IN AERS: 2.0000 | INHALATION_SPRAY | Freq: Four times a day (QID) | RESPIRATORY_TRACT | 0 refills | Status: DC | PRN

## 2022-12-02 NOTE — Progress Notes (Signed)
Acute Office Visit  Subjective:     Patient ID: Cassandra Levy, female    DOB: Mar 13, 1981, 41 y.o.   MRN: [id number]  Chief Complaint  Patient presents with   Follow-up    Symptoms have worsened  Tested negative for covid and flu  Shortness of breath  Thick green mucus (coughing up) Feels dehydrated  Body ache, chills  Headache     HPI Patient is in today for cough.  Symptoms started about a week ago, but got significantly worse 4-5 days ago. She saw a provider at [facility] last Friday (negative flu and COVID) and was instructed to continue supportive measures. Reports she has only gotten worse since then. She can only sleep for about 20 minutes before having coughing fits.  She is having headaches and body aches (she thinks that is just from all of the coughing). Cough is productive with thick, greenish/yellow sputum - almost made her choke earlier today. Nasal congestion and rhinorrhea with clear mucus, sinus pressure on entire face. She reports that she gets short of breath (can't breathe through her nose like normal), but otherwise no trouble breathing. Sore throat and hoarseness which she reports is from all of the coughing. Last night was a terrible night with all of the coughing.  She has been taking Coricidin but didn't help.  Husband has similar symptoms and has also tested negative for flu, COVID.   Not currently pregnant. Period started today.     ROS All review of systems negative except what is listed in the HPI      Objective:    BP 131/84   Pulse 91   Temp 99.1 F (37.3 C) (Oral)   Ht 5\' 9"  (1.753 m)   Wt (!) 368 lb 14.4 oz (167.3 kg)   LMP 12/02/2022 (Exact Date)   SpO2 94%   BMI 54.48 kg/m    Physical Exam Vitals reviewed.  Constitutional:      Appearance: Normal appearance. She is obese.  HENT:     Head: Normocephalic and atraumatic.  Cardiovascular:     Rate and Rhythm: Normal rate and regular rhythm.  Pulmonary:     Effort:  Pulmonary effort is normal.     Breath sounds: Normal breath sounds. No wheezing, rhonchi or rales.     Comments: Congested cough Skin:    General: Skin is warm and dry.  Neurological:     Mental Status: She is alert and oriented to person, place, and time.  Psychiatric:        Mood and Affect: Mood normal.        Behavior: Behavior normal.        Thought Content: Thought content normal.        Judgment: Judgment normal.       No results found for any visits on 12/02/22.      Assessment & Plan:   Problem List Items Addressed This Visit   None Visit Diagnoses     Sinobronchitis    -  Primary Send-off swab for RSV Given severity, starting antibiotics (doxycycline) Adding Mucinex, Tessalon, Tussionex, Albuterol PRN Continue supportive measures including rest, hydration, humidifier use, steam showers, warm compresses to sinuses, warm liquids with lemon and honey, and over-the-counter cough, cold, and analgesics as needed.     Relevant Medications   albuterol (VENTOLIN HFA) 108 (90 Base) MCG/ACT inhaler   chlorpheniramine-HYDROcodone (TUSSIONEX) 10-8 MG/5ML   benzonatate (TESSALON) 200 MG capsule   guaiFENesin (MUCINEX) 600 MG 12 hr tablet  doxycycline (VIBRA-TABS) 100 MG tablet   fluticasone (FLONASE) 50 MCG/ACT nasal spray   Acute cough       Relevant Orders   COVID-19, Flu A+B and RSV       Meds ordered this encounter  Medications   albuterol (VENTOLIN HFA) 108 (90 Base) MCG/ACT inhaler    Sig: Inhale 2 puffs into the lungs every 6 (six) hours as needed for wheezing or shortness of breath.    Dispense:  8 g    Refill:  0    Order Specific Question:   Supervising Provider    Answer:   Penni Homans A [[id number]]   chlorpheniramine-HYDROcodone (TUSSIONEX) 10-8 MG/5ML    Sig: Take 5 mLs by mouth every 12 (twelve) hours as needed for up to 5 days.    Dispense:  50 mL    Refill:  0    Order Specific Question:   Supervising Provider    Answer:   Penni Homans A [[id number]]    benzonatate (TESSALON) 200 MG capsule    Sig: Take 1 capsule (200 mg total) by mouth 2 (two) times daily as needed for cough.    Dispense:  20 capsule    Refill:  0    Order Specific Question:   Supervising Provider    Answer:   Penni Homans A [[id number]]   guaiFENesin (MUCINEX) 600 MG 12 hr tablet    Sig: Take 2 tablets (1,200 mg total) by mouth 2 (two) times daily.    Dispense:  30 tablet    Refill:  1    Order Specific Question:   Supervising Provider    Answer:   Penni Homans A [[id number]]   doxycycline (VIBRA-TABS) 100 MG tablet    Sig: Take 1 tablet (100 mg total) by mouth 2 (two) times daily for 10 days.    Dispense:  20 tablet    Refill:  0    Order Specific Question:   Supervising Provider    Answer:   Penni Homans A [[id number]]   fluticasone (FLONASE) 50 MCG/ACT nasal spray    Sig: Place 2 sprays into both nostrils daily.    Dispense:  16 g    Refill:  6    Order Specific Question:   Supervising Provider    Answer:   Penni Homans A [[id number]]    Return if symptoms worsen or fail to improve.  Terrilyn Saver, NP

## 2022-12-02 NOTE — Patient Instructions (Addendum)
Send-off swab Given severity, starting antibiotics (doxycycline) Adding Mucinex, Tessalon, Tussionex, Albuterol PRN Continue supportive measures including rest, hydration, humidifier use, steam showers, warm compresses to sinuses, warm liquids with lemon and honey, and over-the-counter cough, cold, and analgesics as needed.   Please contact office for follow-up if symptoms do not improve or worsen. Seek emergency care if symptoms become severe.    The following information is provided as a Counsellor for ADULT patients only and does NOT take into account PREGNANCY, ALLERGIES, LIVER CONDITIONS, KIDNEY CONDITIONS, GASTROINTESTINAL CONDITIONS, OR PRESCRIPTION MEDICATION INTERACTIONS. Please be sure to ask your provider if the following are safe to take with your specific medical history, conditions, or current medication regimen if you are unsure.   Adult Basic Symptom Management for Sinusitis  Congestion: Guaifenesin (Mucinex)- follow directions on packaging with a maximum dose of 2400mg  in a 24 hour period.  Pain/Fever: Ibuprofen 200mg  - 400mg  every 4-6 hours as needed (MAX 1200mg  in a 24 hour period) Pain/Fever: Tylenol 500mg  -1000mg  every 6-8 hours as needed (MAX 3000mg  in a 24 hour period)  Cough: Dextromethorphan (Delsym)- follow directions on packing with a maximum dose of 120mg  in a 24 hour period.  Nasal Stuffiness: Saline nasal spray and/or Nettie Pot with sterile saline solution  Runny Nose: Fluticasone nasal spray (Flonase) OR Mometasone nasal spray (Nasonex) OR Triamcinolone Acetonide nasal spray (Nasacort)- follow directions on the packaging  Pain/Pressure: Warm washcloth to the face  Sore Throat: Warm salt water gargles  If you have allergies, you may also consider taking an oral antihistamine (like Zyrtec or Claritin) as these may also help with your symptoms.  **Many medications will have more than one ingredient, be sure you are reading the packaging carefully and  not taking more than one dose of the same kind of medication at the same time or too close together. It is OK to use formulas that have all of the ingredients you want, but do not take them in a combined medication and as separate dose too close together. If you have any questions, the pharmacist will be happy to help you decide what is safe.

## 2022-12-03 NOTE — Addendum Note (Signed)
Addended by: Mervin Kung A on: 12/03/2022 07:54 AM   Modules accepted: Orders

## 2022-12-03 NOTE — Addendum Note (Signed)
Addended by: Mervin Kung A on: 12/03/2022 07:48 AM   Modules accepted: Orders

## 2022-12-05 LAB — HM PAP SMEAR: HPV, high-risk: NEGATIVE

## 2022-12-06 ENCOUNTER — Telehealth: Payer: Self-pay | Admitting: Family Medicine

## 2022-12-06 DIAGNOSIS — J329 Chronic sinusitis, unspecified: Secondary | ICD-10-CM

## 2022-12-06 MED ORDER — PREDNISONE 20 MG PO TABS: 40.0000 mg | ORAL_TABLET | Freq: Every day | ORAL | 0 refills | Status: AC

## 2022-12-06 NOTE — Telephone Encounter (Signed)
Pt called stating she isn't feeling better off her appt with Ladona Ridgel on 12.18.23 and was wondering how to proceed with treatment. Please Advise.

## 2022-12-06 NOTE — Telephone Encounter (Signed)
Spoke w/ Pt- she would like to try prednisone, please sent to [facility] on [address]. She has no preference on chest x-ray or not. She did schedule an appt with you on 12/26 for follow-up

## 2022-12-07 LAB — RSV, NAA: RSV, NAA: DETECTED — AB

## 2022-12-07 LAB — SPECIMEN STATUS REPORT

## 2022-12-10 ENCOUNTER — Ambulatory Visit: Payer: 59 | Admitting: Family Medicine

## 2022-12-10 ENCOUNTER — Encounter: Payer: Self-pay | Admitting: Family Medicine

## 2022-12-10 DIAGNOSIS — J40 Bronchitis, not specified as acute or chronic: Secondary | ICD-10-CM

## 2022-12-10 DIAGNOSIS — J329 Chronic sinusitis, unspecified: Secondary | ICD-10-CM | POA: Diagnosis not present

## 2022-12-10 MED ORDER — DOXYCYCLINE HYCLATE 100 MG PO TABS: 100.0000 mg | ORAL_TABLET | Freq: Two times a day (BID) | ORAL | 0 refills | Status: AC

## 2022-12-10 MED ORDER — BENZONATATE 200 MG PO CAPS: 200.0000 mg | ORAL_CAPSULE | Freq: Two times a day (BID) | ORAL | 0 refills | Status: DC | PRN

## 2022-12-10 NOTE — Telephone Encounter (Signed)
Pt aware.

## 2022-12-10 NOTE — Progress Notes (Signed)
Acute Office Visit  Subjective:     Patient ID: Cassandra Levy, female    DOB: 07-26-1981, 41 y.o.   MRN: [id number]  Chief Complaint  Patient presents with   Follow-up    Sick Visit    HPI Patient is in today for URI follow-up.   Cassandra Levy was mostly recently seen by me last week with worsening URI symptoms and we treated her for sinusitis - doxy, tessalon, albuterol, Tussionex. RSV swab pending.  She called back end of last week and asked for prednisone. Only took one dose and it made her too jittery so she didn't finish it.   She is feeling quite a bit better and has been able to get some sleep with the medications. She has still been getting some shortness of breath but primarily only with activity. She is still taking Mucinex, Flonase, OTC. She still has upper chest congestion and cough with some fatigue.  Reports her MyChart shows RSV detected, but not visible in our system yet. Patient denies any chest pain, palpitations, wheezing, edema, recurrent headaches, fevers, chills.    Scheduled to go back to work Thursday.     ROS All review of systems negative except what is listed in the HPI      Objective:    BP (!) 102/53   Pulse 84   Temp 97.8 F (36.6 C) (Oral)   Resp 16   Ht 5\' 8"  (1.727 m)   Wt (!) 362 lb (164.2 kg)   LMP 12/02/2022 (Exact Date)   SpO2 97%   BMI 55.04 kg/m    Physical Exam Vitals reviewed.  Constitutional:      Appearance: Normal appearance.  Cardiovascular:     Rate and Rhythm: Normal rate and regular rhythm.     Pulses: Normal pulses.     Heart sounds: Normal heart sounds.  Pulmonary:     Effort: Pulmonary effort is normal.     Breath sounds: Normal breath sounds.  Skin:    General: Skin is warm and dry.  Neurological:     Mental Status: She is alert and oriented to person, place, and time.  Psychiatric:        Mood and Affect: Mood normal.        Behavior: Behavior normal.        Thought Content: Thought content normal.         Judgment: Judgment normal.     No results found for any visits on 12/10/22.      Assessment & Plan:   Problem List Items Addressed This Visit   None Visit Diagnoses     Sinobronchitis     Making significant progress. Will send in additional 2 days of ABX and refill of Tessalon in case needed. Continue supportive measures including rest, hydration, humidifier use, steam showers, warm compresses to sinuses, warm liquids with lemon and honey, and over-the-counter cough, cold, and analgesics as needed.  Patient aware of signs/symptoms requiring further/urgent evaluation.     Relevant Medications   doxycycline (VIBRA-TABS) 100 MG tablet   benzonatate (TESSALON) 200 MG capsule       Meds ordered this encounter  Medications   doxycycline (VIBRA-TABS) 100 MG tablet    Sig: Take 1 tablet (100 mg total) by mouth 2 (two) times daily for 2 days.    Dispense:  4 tablet    Refill:  0    Order Specific Question:   Supervising Provider    Answer:   12/12/22  A [[id number]]   benzonatate (TESSALON) 200 MG capsule    Sig: Take 1 capsule (200 mg total) by mouth 2 (two) times daily as needed for cough.    Dispense:  20 capsule    Refill:  0    Order Specific Question:   Supervising Provider    Answer:   Danise Edge A [[id number]]    Return if symptoms worsen or fail to improve.  Clayborne Dana, NP

## 2022-12-22 NOTE — Patient Instructions (Signed)
It was good to see you again today, I will be in touch with the results of soon as possible 

## 2022-12-22 NOTE — Progress Notes (Unsigned)
[facility] [address], [place], [place] [address] [phone]  Date:  12/25/2022   Name:  Lennyx Verdell Salak   DOB:  30-Sep-1981   MRN:  [id number]  PCP:  Darreld Mclean, MD    Chief Complaint: No chief complaint on file.   History of Present Illness:  Darby Fleeman Pangelinan is a 42 y.o. very pleasant female patient who presents with the following:  Patient seen today for physical exam Most recent visit with myself was about a year ago, she has seen Caleen Jobs NP on a few occasions in the interim, most recently had a couple of visits for a viral upper respiratory infection and bronchitis  History of obesity, hypothyroidism, PCOS, lower extremity edema   Allergy to flu vaccine Pap screening Mammogram was done December Can update routine lab work today  HCTZ 25 to 37.5 mg daily Levothyroxine 112 Patient Active Problem List   Diagnosis Date Noted   Degenerative tear of lateral meniscus of left knee 09/11/2022   Synovitis of left shoulder 08/07/2022   Morbid obesity ([place]) 10/03/2015   Hyperthyroidism 01/20/2014   PCOS (polycystic ovarian syndrome) 12/13/2013   Lower extremity edema 12/03/2013   Obesity 10/08/2012   Goiter 06/27/2012   Constipation 06/27/2012    Past Medical History:  Diagnosis Date   Allergy    Anemia    Thyroid disease     Past Surgical History:  Procedure Laterality Date   CHOLECYSTECTOMY     COLPOSCOPY      Social History   Tobacco Use   Smoking status: Never   Smokeless tobacco: Never  Vaping Use   Vaping Use: Never used  Substance Use Topics   Alcohol use: No   Drug use: No    Family History  Problem Relation Age of Onset   Stroke Mother    Leukemia Other     Allergies  Allergen Reactions   Amoxicillin Swelling    Tightness in throat   Bee Venom    Cleocin [Clindamycin Hcl]     hives    Medication list has been reviewed and updated.  Current  Outpatient Medications on File Prior to Visit  Medication Sig Dispense Refill   albuterol (VENTOLIN HFA) 108 (90 Base) MCG/ACT inhaler Inhale 2 puffs into the lungs every 6 (six) hours as needed for wheezing or shortness of breath. 8 g 0   Ascorbic Acid (VITAMIN C PO) Take by mouth.     benzonatate (TESSALON) 200 MG capsule Take 1 capsule (200 mg total) by mouth 2 (two) times daily as needed for cough. 20 capsule 0   Cholecalciferol (VITAMIN D3) 1.25 MG (50000 UT) CAPS Take 1 weekly for 12 weeks 12 capsule 0   clotrimazole (CLOTRIMAZOLE AF) 1 % cream Apply 1 application topically 2 (two) times daily. 60 g 0   EPINEPHrine (EPIPEN) 0.3 mg/0.3 mL SOAJ injection Inject 0.3 mLs (0.3 mg total) into the muscle once. 2 Device 2   fluticasone (FLONASE) 50 MCG/ACT nasal spray Place 2 sprays into both nostrils daily. 16 g 6   guaiFENesin (MUCINEX) 600 MG 12 hr tablet Take 2 tablets (1,200 mg total) by mouth 2 (two) times daily. 30 tablet 1   hydrochlorothiazide (HYDRODIURIL) 25 MG tablet TAKE 1-1.5 TABLETS (25-37.5 MG TOTAL) BY MOUTH DAILY. 135 tablet 3   levothyroxine (SYNTHROID) 112 MCG tablet Take 1 tablet (112 mcg total) by mouth daily before breakfast. 90 tablet 3  metFORMIN (GLUCOPHAGE-XR) 750 MG 24 hr tablet Take 750 mg by mouth daily.     norethindrone (AYGESTIN) 5 MG tablet Take 10 mg by mouth daily.      Prenatal Vit-Fe Fumarate-FA (PRENATAL MULTIVITAMIN) TABS tablet Take 1 tablet by mouth daily at 12 noon.     No current facility-administered medications on file prior to visit.    Review of Systems:  As per HPI- otherwise negative.   Physical Examination: There were no vitals filed for this visit. There were no vitals filed for this visit. There is no height or weight on file to calculate BMI. Ideal Body Weight:    GEN: no acute distress. HEENT: Atraumatic, Normocephalic.  Ears and Nose: No external deformity. CV: RRR, No M/G/R. No JVD. No thrill. No extra heart sounds. PULM: CTA  B, no wheezes, crackles, rhonchi. No retractions. No resp. distress. No accessory muscle use. ABD: S, NT, ND, +BS. No rebound. No HSM. EXTR: No c/c/e PSYCH: Normally interactive. Conversant.    Assessment and Plan: *** Physical exam today.  Encouraged healthy diet and exercise routine Signed Abbe Amsterdam, MD

## 2022-12-25 ENCOUNTER — Ambulatory Visit (INDEPENDENT_AMBULATORY_CARE_PROVIDER_SITE_OTHER): Payer: 59 | Admitting: Family Medicine

## 2022-12-25 VITALS — BP 128/80 | HR 71 | Temp 97.7°F | Resp 18 | Ht 68.0 in | Wt 368.0 lb

## 2022-12-25 DIAGNOSIS — Z0000 Encounter for general adult medical examination without abnormal findings: Secondary | ICD-10-CM

## 2022-12-25 DIAGNOSIS — Z13220 Encounter for screening for lipoid disorders: Secondary | ICD-10-CM

## 2022-12-25 DIAGNOSIS — Z131 Encounter for screening for diabetes mellitus: Secondary | ICD-10-CM

## 2022-12-25 DIAGNOSIS — E559 Vitamin D deficiency, unspecified: Secondary | ICD-10-CM | POA: Diagnosis not present

## 2022-12-25 DIAGNOSIS — E039 Hypothyroidism, unspecified: Secondary | ICD-10-CM

## 2022-12-25 DIAGNOSIS — Z130 Encounter for screening for diseases of the blood and blood-forming organs and certain disorders involving the immune mechanism: Secondary | ICD-10-CM

## 2022-12-25 DIAGNOSIS — R600 Localized edema: Secondary | ICD-10-CM

## 2022-12-25 MED ORDER — HYDROCHLOROTHIAZIDE 25 MG PO TABS: ORAL_TABLET | ORAL | 3 refills | Status: DC

## 2022-12-26 ENCOUNTER — Encounter: Payer: Self-pay | Admitting: Family Medicine

## 2022-12-26 LAB — CBC
HCT: 40.6 % (ref 36.0–46.0)
Hemoglobin: 13.6 g/dL (ref 12.0–15.0)
MCHC: 33.5 g/dL (ref 30.0–36.0)
MCV: 89.6 fl (ref 78.0–100.0)
Platelets: 354 10*3/uL (ref 150.0–400.0)
RBC: 4.54 Mil/uL (ref 3.87–5.11)
RDW: 13.9 % (ref 11.5–15.5)
WBC: 8.9 10*3/uL (ref 4.0–10.5)

## 2022-12-26 LAB — COMPREHENSIVE METABOLIC PANEL
ALT: 17 U/L (ref 0–35)
AST: 15 U/L (ref 0–37)
Albumin: 4 g/dL (ref 3.5–5.2)
Alkaline Phosphatase: 75 U/L (ref 39–117)
BUN: 14 mg/dL (ref 6–23)
CO2: 31 mEq/L (ref 19–32)
Calcium: 9.3 mg/dL (ref 8.4–10.5)
Chloride: 101 mEq/L (ref 96–112)
Creatinine, Ser: 0.9 mg/dL (ref 0.40–1.20)
GFR: 79.41 mL/min (ref >60.00)
Glucose, Bld: 76 mg/dL (ref 70–99)
Potassium: 4.1 mEq/L (ref 3.5–5.1)
Sodium: 139 mEq/L (ref 135–145)
Total Bilirubin: 0.7 mg/dL (ref 0.2–1.2)
Total Protein: 7 g/dL (ref 6.0–8.3)

## 2022-12-26 LAB — VITAMIN D 25 HYDROXY (VIT D DEFICIENCY, FRACTURES): VITD: 27.4 ng/mL — ABNORMAL LOW (ref 30.00–100.00)

## 2022-12-26 LAB — LIPID PANEL
Cholesterol: 221 mg/dL — ABNORMAL HIGH (ref 0–200)
HDL: 45 mg/dL (ref >39.00)
LDL Cholesterol: 160 mg/dL — ABNORMAL HIGH (ref 0–99)
NonHDL: 176.1
Total CHOL/HDL Ratio: 5
Triglycerides: 79 mg/dL (ref 0.0–149.0)
VLDL: 15.8 mg/dL (ref 0.0–40.0)

## 2022-12-26 LAB — HEMOGLOBIN A1C: Hgb A1c MFr Bld: 5.7 % (ref 4.6–6.5)

## 2022-12-26 LAB — TSH: TSH: 4.34 u[IU]/mL (ref 0.35–5.50)

## 2023-01-15 ENCOUNTER — Ambulatory Visit: Payer: 59 | Admitting: Family Medicine

## 2023-01-15 ENCOUNTER — Other Ambulatory Visit: Payer: Self-pay | Admitting: Family Medicine

## 2023-01-15 ENCOUNTER — Encounter: Payer: Self-pay | Admitting: Family Medicine

## 2023-01-15 VITALS — BP 140/71 | HR 100 | Temp 100.0°F | Resp 16 | Ht 68.5 in | Wt 369.0 lb

## 2023-01-15 DIAGNOSIS — Z1152 Encounter for screening for COVID-19: Secondary | ICD-10-CM | POA: Diagnosis not present

## 2023-01-15 DIAGNOSIS — E039 Hypothyroidism, unspecified: Secondary | ICD-10-CM

## 2023-01-15 DIAGNOSIS — R6889 Other general symptoms and signs: Secondary | ICD-10-CM | POA: Diagnosis not present

## 2023-01-15 DIAGNOSIS — U071 COVID-19: Secondary | ICD-10-CM | POA: Diagnosis not present

## 2023-01-15 LAB — POCT INFLUENZA A/B
Influenza A, POC: NEGATIVE
Influenza B, POC: NEGATIVE

## 2023-01-15 LAB — POC COVID19 BINAXNOW: SARS Coronavirus 2 Ag: POSITIVE — AB

## 2023-01-15 MED ORDER — HYDROCOD POLI-CHLORPHE POLI ER 10-8 MG/5ML PO SUER: 5.0000 mL | Freq: Two times a day (BID) | ORAL | 0 refills | Status: AC | PRN

## 2023-01-15 MED ORDER — GUAIFENESIN ER 600 MG PO TB12: 1200.0000 mg | ORAL_TABLET | Freq: Two times a day (BID) | ORAL | 1 refills | Status: DC

## 2023-01-15 MED ORDER — NIRMATRELVIR/RITONAVIR (PAXLOVID)TABLET: 3.0000 | ORAL_TABLET | Freq: Two times a day (BID) | ORAL | 0 refills | Status: AC

## 2023-01-15 MED ORDER — FLUTICASONE PROPIONATE 50 MCG/ACT NA SUSP: 2.0000 | Freq: Every day | NASAL | 6 refills | Status: AC

## 2023-01-15 MED ORDER — BENZONATATE 200 MG PO CAPS: 200.0000 mg | ORAL_CAPSULE | Freq: Two times a day (BID) | ORAL | 0 refills | Status: DC | PRN

## 2023-01-15 NOTE — Progress Notes (Signed)
Acute Office Visit  Subjective:     Patient ID: [place], female    DOB: 1981-02-04, 42 y.o.   MRN: [id number]  Chief Complaint  Patient presents with   Cough   Fever   Nasal Congestion   Sore Throat    HPI Patient is in today for URI symptoms.   Patient has had one day of cough, sore throat, fatigue, nasal congestion, rhinorrhea, fever, body aches. She denies headache, chest pain, dyspnea, GI/GU symptoms. Her husband had a recent COVID exposure and started feeling poorly today as well. She reports recent RSV a few weeks ago and COVID PNA in 2022.      ROS All review of systems negative except what is listed in the HPI      Objective:    BP (!) 140/71   Pulse 100   Temp 100 F (37.8 C)   Resp 16   Ht 5' 8.5" (1.74 m)   Wt (!) 369 lb (167.4 kg)   SpO2 99%   BMI 55.29 kg/m    Physical Exam Vitals reviewed.  Constitutional:      Appearance: Normal appearance. She is well-developed. She is obese. She is ill-appearing.  HENT:     Head: Normocephalic and atraumatic.  Eyes:     Conjunctiva/sclera: Conjunctivae normal.  Cardiovascular:     Rate and Rhythm: Normal rate and regular rhythm.     Pulses: Normal pulses.     Heart sounds: Normal heart sounds.  Pulmonary:     Effort: Pulmonary effort is normal.     Breath sounds: Normal breath sounds. No wheezing, rhonchi or rales.  Skin:    General: Skin is warm and dry.  Neurological:     Mental Status: She is alert and oriented to person, place, and time.  Psychiatric:        Mood and Affect: Mood normal.        Behavior: Behavior normal.        Thought Content: Thought content normal.        Judgment: Judgment normal.     Results for orders placed or performed in visit on 01/15/23  POC COVID-19  Result Value Ref Range   SARS Coronavirus 2 Ag Positive (A) Negative  POCT Influenza A/B  Result Value Ref Range   Influenza A, POC Negative Negative   Influenza B, POC Negative Negative         Assessment & Plan:   1. Encounter for screening for COVID-19 2. Flu-like symptoms 3. COVID-19 COVID + Paxlovid sent in - information sheet attached. Use a different form of birth control while on Paxlovid and for 2 weeks after.  Adding Flonase, Mucinex, Tessalon, and Tussionex. She has albuterol inhaler if needed. Information on supportive measures, OTC meds, and isolation precautions added to AVS.  Patient aware of signs/symptoms requiring further/urgent evaluation.    - POC COVID-19 - POCT Influenza A/B - guaiFENesin (MUCINEX) 600 MG 12 hr tablet; Take 2 tablets (1,200 mg total) by mouth 2 (two) times daily.  Dispense: 30 tablet; Refill: 1 - benzonatate (TESSALON) 200 MG capsule; Take 1 capsule (200 mg total) by mouth 2 (two) times daily as needed for cough.  Dispense: 20 capsule; Refill: 0 - fluticasone (FLONASE) 50 MCG/ACT nasal spray; Place 2 sprays into both nostrils daily.  Dispense: 16 g; Refill: 6 - nirmatrelvir/ritonavir (PAXLOVID) 20 x 150 MG & 10 x 100MG  TABS; Take 3 tablets by mouth 2 (two) times daily for 5 days. (Take  nirmatrelvir 150 mg two tablets twice daily for 5 days and ritonavir 100 mg one tablet twice daily for 5 days) Patient GFR is 79.41  Dispense: 30 tablet; Refill: 0 - chlorpheniramine-HYDROcodone (TUSSIONEX) 10-8 MG/5ML; Take 5 mLs by mouth every 12 (twelve) hours as needed for up to 5 days.  Dispense: 50 mL; Refill: 0   Return if symptoms worsen or fail to improve.  Terrilyn Saver, NP

## 2023-01-15 NOTE — Patient Instructions (Signed)
Paxlovid sent in - information sheet attached. Use a different form of birth control while on Paxlovid and for 2 weeks after.   Over the counter medications that may be helpful for symptoms:  Guaifenesin 1200 mg extended release tabs twice daily, with plenty of water For cough and congestion Brand name: Mucinex   Pseudoephedrine 30 mg, one or two tabs every 4 to 6 hours For sinus congestion Brand name: Sudafed You must get this from the pharmacy counter.  Oxymetazoline nasal spray each morning, one spray in each nostril, for NO MORE THAN 3 days  For nasal and sinus congestion Brand name: Afrin Saline nasal spray or Saline Nasal Irrigation 3-5 times a day For nasal and sinus congestion Brand names: Ocean or AYR Fluticasone nasal spray, one spray in each nostril, each morning (after oxymetazoline and saline, if used) For nasal and sinus congestion Brand name: Flonase Warm salt water gargles  For sore throat Every few hours as needed Alternate ibuprofen 400-600 mg and acetaminophen 1000 mg every 4-6 hours For fever, body aches, headache Brand names: Motrin or Advil and Tylenol Dextromethorphan 12-hour cough version 30 mg every 12 hours  For cough Brand name: Delsym Stop all other cold medications for now (Nyquil, Dayquil, Tylenol Cold, Theraflu, etc) and other non-prescription cough/cold preparations. Many of these have the same ingredients listed above and could cause an overdose of medication.   Herbal treatments that have been shown to be helpful in some patients include: Vitamin C 1000mg  per day Vitamin D 4000iU per day Zinc 100mg  per day Quercetin 25-500mg  twice a day Melatonin 5-10mg  at bedtime  General Instructions Allow your body to rest Drink PLENTY of fluids Isolate yourself from everyone, even family, until test results have returned  If your COVID-19 test is positive Then you ARE INFECTED and you can pass the virus to others You must quarantine from others for a  minimum of  5 days since symptoms started AND You are fever free for 24 hours WITHOUT any medication to reduce fever AND Your symptoms are improving Wear mask for the next 5 days If not improved by day 5, quarantine for the full 10 days. Do not go to the store or other public areas Do not go around household members who are not known to be infected with COVID-19 If you MUST leave your area of quarantine (example: go to a bathroom you share with others in your home), you must Wear a mask Wash your hands thoroughly Wipe down any surfaces you touch Do not share food, drinks, towels, or other items with other persons Dispose of your own tissues, food containers, etc  Once you have recovered, please continue good preventive care measures, including:  wearing a mask when in public wash your hands frequently avoid touching your face/nose/eyes cover coughs/sneezes with the inside of your elbow stay out of crowds keep a 6 foot distance from others  If you develop severe shortness of breath, uncontrolled fevers, coughing up blood, confusion, chest pain, or signs of dehydration (such as significantly decreased urine amounts or dizziness with standing) please go to the ER.

## 2023-01-22 ENCOUNTER — Ambulatory Visit: Payer: 59 | Admitting: Family Medicine

## 2023-01-22 ENCOUNTER — Encounter: Payer: Self-pay | Admitting: Family Medicine

## 2023-01-22 VITALS — BP 125/72 | HR 71 | Temp 98.2°F | Resp 16 | Ht 68.5 in | Wt 358.0 lb

## 2023-01-22 DIAGNOSIS — J0140 Acute pansinusitis, unspecified: Secondary | ICD-10-CM | POA: Diagnosis not present

## 2023-01-22 MED ORDER — AZITHROMYCIN 250 MG PO TABS: ORAL_TABLET | ORAL | 0 refills | Status: AC

## 2023-01-22 NOTE — Progress Notes (Signed)
Acute Office Visit  Subjective:     Patient ID: [place], female    DOB: [birth year], 42 y.o.   MRN: [id number]  Chief Complaint  Patient presents with   Follow-up    Post covid cough, sinus issues    HPI Patient is in today for sinusitis.  Patient tested positive at our office on 01/15/23 after one day of symptoms. She was given Paxlovid, Mucinex, and information on supportive measures and OTC options. She declined prednisone as she does not like the side effects.   Patient reports she is still coughing, but it is more of a tickle in her throat and PND. Occasionally it will be deep, but minimal sputum production. She is having a lot of nasal congestion and isnus pressure. She has been continuing with low-grade temps in the evenings, but is fever-free today. She denies any chest pain, dyspnea, wheezing, nausea, vomiting, diarrhea.   Most recent antibiotic was doxycyline in December for ABRS.    ROS All review of systems negative except what is listed in the HPI      Objective:    BP 125/72   Pulse 71   Temp 98.2 F (36.8 C)   Resp 16   Ht 5' 8.5" (1.74 m)   Wt (!) 358 lb (162.4 kg)   SpO2 99%   BMI 53.64 kg/m    Physical Exam Vitals reviewed.  Constitutional:      Appearance: Normal appearance.  HENT:     Head: Normocephalic and atraumatic.     Comments: Mild tenderness to maxillary sinus percussion    Right Ear: Tympanic membrane normal.     Left Ear: Tympanic membrane normal.     Nose: Congestion present.     Mouth/Throat:     Mouth: Mucous membranes are moist.     Pharynx: Oropharynx is clear.  Cardiovascular:     Rate and Rhythm: Normal rate and regular rhythm.     Pulses: Normal pulses.     Heart sounds: Normal heart sounds.  Pulmonary:     Effort: Pulmonary effort is normal.     Breath sounds: Normal breath sounds. No wheezing, rhonchi or rales.  Musculoskeletal:     Cervical back: Normal range of motion and neck supple. No tenderness.   Lymphadenopathy:     Cervical: No cervical adenopathy.  Skin:    General: Skin is warm and dry.  Neurological:     Mental Status: She is alert and oriented to person, place, and time.  Psychiatric:        Mood and Affect: Mood normal.        Behavior: Behavior normal.        Thought Content: Thought content normal.        Judgment: Judgment normal.         No results found for any visits on 01/22/23.      Assessment & Plan:   Problem List Items Addressed This Visit   None Visit Diagnoses     Acute non-recurrent pansinusitis    -  Primary   Relevant Medications   azithromycin (ZITHROMAX) 250 MG tablet     Work note provided. Lungs sound good today - continue to monitor for new/worsening symptoms we discussed.  Continue supportive measures including rest, hydration, humidifier use, steam showers, warm compresses to sinuses, warm liquids with lemon and honey, and over-the-counter cough, cold, and analgesics as needed.  If not improving in the next 2-3 days, start the [place].  Meds ordered this encounter  Medications   azithromycin (ZITHROMAX) 250 MG tablet    Sig: Take 2 tablets on day 1, then 1 tablet daily on days 2 through 5    Dispense:  6 tablet    Refill:  0    Order Specific Question:   Supervising Provider    Answer:   Penni Homans A [[id number]]    Return if symptoms worsen or fail to improve.  Terrilyn Saver, NP

## 2023-01-22 NOTE — Patient Instructions (Signed)
Work note provided. Lungs sound good today - continue to monitor for new/worsening symptoms we discussed.  Continue supportive measures including rest, hydration, humidifier use, steam showers, warm compresses to sinuses, warm liquids with lemon and honey, and over-the-counter cough, cold, and analgesics as needed.  If not improving in the next 2-3 day, start the [place].  Please contact office for follow-up if symptoms do not improve or worsen. Seek emergency care if symptoms become severe.

## 2023-03-31 ENCOUNTER — Encounter: Payer: Self-pay | Admitting: *Deleted

## 2023-04-10 ENCOUNTER — Ambulatory Visit: Payer: 59 | Admitting: Family Medicine

## 2023-04-10 VITALS — BP 145/82 | HR 75 | Ht 68.5 in | Wt 369.0 lb

## 2023-04-10 DIAGNOSIS — R6884 Jaw pain: Secondary | ICD-10-CM

## 2023-04-10 DIAGNOSIS — H60502 Unspecified acute noninfective otitis externa, left ear: Secondary | ICD-10-CM

## 2023-04-10 MED ORDER — CIPROFLOXACIN-DEXAMETHASONE 0.3-0.1 % OT SUSP: 4.0000 [drp] | Freq: Two times a day (BID) | OTIC | 0 refills | Status: AC

## 2023-04-10 NOTE — Progress Notes (Signed)
   Acute Office Visit  Subjective:     Patient ID: Cassandra Levy, female    DOB: 06/14/1981, 42 y.o.   MRN: [id number]  Chief Complaint  Patient presents with   Jaw Pain    HPI Patient is in today for jaw pain and ear itching   Jaw pain Onset: Woke up yesterday feeling like her left ear was a little itchy and she was having some pain in front of her ear. Progressively worsened into more jaw pain and she woke up clinching her teeth and having trouble opening her jaw.  Location: left  TMJ area, preauricular Duration: constant since yesterday Characteristics: jaw pain/stiffness on left side, left ear itching Aggravating factors: Trying to open mouth, chewing Alleviating factors: ibuprofen helps Radiating/associated symptoms: no Timing: constant Severity: moderate States she has been under a lot of stress so that could be why she has been clinching her jaw at night.     ROS All review of systems negative except what is listed in the HPI      Objective:    BP (!) 145/82   Pulse 75   Ht 5' 8.5" (1.74 m)   Wt (!) 369 lb (167.4 kg)   SpO2 99%   BMI 55.29 kg/m    Physical Exam Vitals reviewed.  Constitutional:      Appearance: Normal appearance.  HENT:     Head:     Comments: Pain with opening jar fully, but full ROM    Right Ear: Tympanic membrane, ear canal and external ear normal.     Left Ear: Tympanic membrane and external ear normal.     Ears:     Comments: Left ear canal with erythema and inflammation     Mouth/Throat:     Mouth: Mucous membranes are moist.     Pharynx: Oropharynx is clear. No posterior oropharyngeal erythema.  Skin:    General: Skin is warm and dry.  Neurological:     Mental Status: She is alert and oriented to person, place, and time.  Psychiatric:        Mood and Affect: Mood normal.        Behavior: Behavior normal.        Thought Content: Thought content normal.        Judgment: Judgment normal.     No results found for  any visits on 04/10/23.      Assessment & Plan:   Problem List Items Addressed This Visit   None Visit Diagnoses     Acute otitis externa of left ear, unspecified type    -  Primary Jaw pain  Jaw pain more preauricular region - related to jaw clinching vs ear infection. Recommend continue ibuprofen for inflammation and pain Start ciprodex drops for ear infection If new symptoms develop, pain worsens, fevers, etc please let us know.      Relevant Medications   ciprofloxacin-dexamethasone (CIPRODEX) OTIC suspension             Meds ordered this encounter  Medications   ciprofloxacin-dexamethasone (CIPRODEX) OTIC suspension    Sig: Place 4 drops into the left ear 2 (two) times daily for 7 days.    Dispense:  2.8 mL    Refill:  0    Order Specific Question:   Supervising Provider    Answer:   Danise Edge A [[id number]]    Return if symptoms worsen or fail to improve.  Clayborne Dana, NP

## 2023-08-01 ENCOUNTER — Encounter: Payer: Self-pay | Admitting: Family Medicine

## 2023-08-01 DIAGNOSIS — M545 Low back pain, unspecified: Secondary | ICD-10-CM

## 2023-08-01 MED ORDER — CYCLOBENZAPRINE HCL 10 MG PO TABS: 10.0000 mg | ORAL_TABLET | Freq: Two times a day (BID) | ORAL | 0 refills | Status: DC | PRN

## 2023-08-01 NOTE — Addendum Note (Signed)
Addended by: Abbe Amsterdam C on: 08/01/2023 02:03 PM   Modules accepted: Orders

## 2023-08-01 NOTE — Telephone Encounter (Signed)

## 2023-08-05 NOTE — Patient Instructions (Incomplete)
It was great to see you again today, I hope you are soon feeling back to normal!  Recommend flu shot, COVID booster this fall Use the hydrocodone as needed for more severe pain- can alternate with the muscle relaxer as needed, ok to also continue your ibuprofen  Go to the lab and then to imaging on the ground floor for your x-rays I will be in touch with your results asap

## 2023-08-05 NOTE — Progress Notes (Addendum)
[facility] [address] [place], [place] [address] [phone]  Date:  08/06/2023   Name:  Cassandra Levy   DOB:  06-22-1981   MRN:  [id number]  PCP:  Pearline Cables, MD    Chief Complaint: Back Pain (Pt says she injured her back somehow Thursday (07/31/23) night. She thinks she turned wrong to pick up her phone and since then her back and been in a lot of pain. Advil does not help much Flexeril helps, but only on the R side. The L side still hurts- but has been injured previously. /)   History of Present Illness:  Tommy Isackson Ramroop is a 42 y.o. very pleasant female patient who presents with the following:  Patient seen today with concern of lower back pain Most recent visit with myself was in January of this year History of obesity, hypothyroidism, PCOS, lower extremity edema  At her last visit she was working with a fertility specialist- pt notes she is not pregnant now  Her GYN is Dr. Juliene Pina  HCTZ 25 to 37.5 mg daily-varies dose according to her lower extremity swelling Levothyroxine 112  Lab work done in January  She contacted me last week with concern of lower back pain-message sent on 8/16. I sent her prescription for Flexeril She contacted me again on 8/19, noting the pain was better but still not gone.  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 This current back pain seems to  be in the same area that she hurt 20 years ago  She seemed to be getting a little bit better but last night she "slept wrong" and her pain is worse again She is trying to walk to loosen it up, change position  No pain down down her leg No numbness or weakness of the leg Flexeril does still help some, also using NSAIDs but did not use yet today   Patient Active Problem List   Diagnosis Date Noted   Degenerative tear of lateral meniscus of left knee 09/11/2022   Synovitis of left shoulder 08/07/2022   Morbid obesity (HCC) 10/03/2015   Hyperthyroidism 01/20/2014   PCOS (polycystic ovarian syndrome) 12/13/2013   Lower extremity edema 12/03/2013   Obesity 10/08/2012   Goiter 06/27/2012   Constipation 06/27/2012    Past Medical History:  Diagnosis Date   Allergy    Anemia    Thyroid disease     Past Surgical History:  Procedure Laterality Date   CHOLECYSTECTOMY     COLPOSCOPY      Social History   Tobacco Use   Smoking status: Never   Smokeless tobacco: Never  Vaping Use   Vaping status: Never Used  Substance Use Topics   Alcohol use: No   Drug use: No    Family History  Problem  Relation Age of Onset   Stroke Mother    Leukemia Other     Allergies  Allergen Reactions   Amoxicillin Swelling    Tightness in throat   Bee Venom    Cleocin [Clindamycin Hcl]     hives    Medication list has been reviewed and updated.  Current Outpatient Medications on File Prior to Visit  Medication Sig Dispense Refill   Ascorbic Acid (VITAMIN C PO) Take by mouth.     Cholecalciferol (VITAMIN D3) 1.25 MG (50000 UT) CAPS Take 1 weekly for 12 weeks 12 capsule 0   clotrimazole (CLOTRIMAZOLE AF) 1 % cream Apply 1 application topically 2 (two) times daily. 60 g 0   cyclobenzaprine (FLEXERIL) 10 MG tablet Take 1 tablet (10 mg total) by mouth 2 (two) times daily as needed for muscle spasms. 30 tablet 0   EPINEPHrine (EPIPEN) 0.3 mg/0.3 mL SOAJ injection Inject 0.3 mLs (0.3 mg total)  into the muscle once. 2 Device 2   fluticasone (FLONASE) 50 MCG/ACT nasal spray Place 2 sprays into both nostrils daily. 16 g 6   hydrochlorothiazide (HYDRODIURIL) 25 MG tablet TAKE 1-1.5 TABLETS (25-37.5 MG TOTAL) BY MOUTH DAILY. 135 tablet 3   levothyroxine (SYNTHROID) 112 MCG tablet TAKE 1 TABLET BY MOUTH DAILY BEFORE BREAKFAST. 30 tablet 11   metFORMIN (GLUCOPHAGE-XR) 750 MG 24 hr tablet Take 750 mg by mouth daily.     norethindrone (AYGESTIN) 5 MG tablet Take 10 mg by mouth daily.      Prenatal Vit-Fe Fumarate-FA (PRENATAL MULTIVITAMIN) TABS tablet Take 1 tablet by mouth daily at 12 noon.     No current facility-administered medications on file prior to visit.    Review of Systems:  As per HPI- otherwise negative.   Physical Examination: Vitals:   08/06/23 1002  BP: 132/84  Pulse: 76  Resp: 18  Temp: 97.8 F (36.6 C)  SpO2: 98%   Vitals:   08/06/23 1002  Weight: (!) 376 lb 9.6 oz (170.8 kg)  Height: 5' 8.5" (1.74 m)   Body mass index is 56.43 kg/m. Ideal Body Weight: Weight in (lb) to have BMI = 25: 166.5  GEN: no acute distress. Morbid obesity, looks well  HEENT: Atraumatic, Normocephalic.  Ears and Nose: No external deformity. CV: RRR, No M/G/R. No JVD. No thrill. No extra heart sounds. PULM: CTA B, no wheezes, crackles, rhonchi. No retractions. No resp. distress. No accessory muscle use. ABD: S, NT, ND EXTR: No c/c/e PSYCH: Normally interactive. Conversant.  Pt notes tenderness in the left sided paraspinous muscles at the superior lumbar level No skin findings or bony tenderness Normal BLE strength, sensation  Negative SLR   Assessment and Plan: Low back pain, unspecified back pain laterality, unspecified chronicity, unspecified whether sciatica present - Plan: HYDROcodone-acetaminophen (NORCO/VICODIN) 5-325 MG tablet, DG Lumbar Spine Complete  Acquired hypothyroidism - Plan: TSH  Medication monitoring encounter - Plan: Basic metabolic panel  Use of  hydrochlorothiazide, will check BMP Follow-up on thyroid Mid back muscle pain/ strain; she is still having a lot of pain, will give #15 hydrocodone.  Obtain plain films.  Work note until Monday   Signed Abbe Amsterdam, MD  Addendum 8/23, received labs as below.  Message to patient I also received word that her hydrocodone was not available at pharmacy  Results for orders placed or performed in visit on 08/06/23  TSH  Result Value Ref Range   TSH 6.23 (H) 0.35 - 5.50 uIU/mL  Basic metabolic panel  Result  Value Ref Range   Sodium 140 135 - 145 mEq/L   Potassium 4.1 3.5 - 5.1 mEq/L   Chloride 104 96 - 112 mEq/L   CO2 29 19 - 32 mEq/L   Glucose, Bld 93 70 - 99 mg/dL   BUN 14 6 - 23 mg/dL   Creatinine, Ser 0.10 0.40 - 1.20 mg/dL   GFR 272.53 >66.44 mL/min   Calcium 8.8 8.4 - 10.5 mg/dL

## 2023-08-06 ENCOUNTER — Encounter: Payer: Self-pay | Admitting: Family Medicine

## 2023-08-06 ENCOUNTER — Ambulatory Visit: Payer: 59 | Admitting: Family Medicine

## 2023-08-06 ENCOUNTER — Ambulatory Visit (HOSPITAL_BASED_OUTPATIENT_CLINIC_OR_DEPARTMENT_OTHER)
Admission: RE | Admit: 2023-08-06 | Discharge: 2023-08-06 | Disposition: A | Discharge status: Home / Self Care | Payer: 59 | Source: Ambulatory Visit | Attending: Family Medicine | Admitting: Family Medicine

## 2023-08-06 VITALS — BP 132/84 | HR 76 | Temp 97.8°F | Resp 18 | Ht 68.5 in | Wt 376.6 lb

## 2023-08-06 DIAGNOSIS — Z5181 Encounter for therapeutic drug level monitoring: Secondary | ICD-10-CM | POA: Diagnosis not present

## 2023-08-06 DIAGNOSIS — M545 Low back pain, unspecified: Secondary | ICD-10-CM | POA: Diagnosis present

## 2023-08-06 DIAGNOSIS — E039 Hypothyroidism, unspecified: Secondary | ICD-10-CM | POA: Diagnosis not present

## 2023-08-06 LAB — BASIC METABOLIC PANEL
BUN: 14 mg/dL (ref 6–23)
CO2: 29 meq/L (ref 19–32)
Calcium: 8.8 mg/dL (ref 8.4–10.5)
Chloride: 104 meq/L (ref 96–112)
Creatinine, Ser: 0.73 mg/dL (ref 0.40–1.20)
GFR: 101.66 mL/min (ref >60.00)
Glucose, Bld: 93 mg/dL (ref 70–99)
Potassium: 4.1 meq/L (ref 3.5–5.1)
Sodium: 140 mEq/L (ref 135–145)

## 2023-08-06 MED ORDER — HYDROCODONE-ACETAMINOPHEN 5-325 MG PO TABS: 1.0000 | ORAL_TABLET | Freq: Three times a day (TID) | ORAL | 0 refills | Status: AC | PRN

## 2023-08-07 ENCOUNTER — Telehealth: Payer: Self-pay | Admitting: Family Medicine

## 2023-08-07 NOTE — Telephone Encounter (Signed)
Pt said that her pharmacy advised the Hydrocodone- acetaminophen 5-325 mg is on backorder. The pharmacy sent a request to see if there is an alternative that can be called in instead. Please advise patient.

## 2023-08-08 ENCOUNTER — Encounter: Payer: Self-pay | Admitting: Family Medicine

## 2023-08-08 DIAGNOSIS — M545 Low back pain, unspecified: Secondary | ICD-10-CM

## 2023-08-08 LAB — TSH: TSH: 6.23 u[IU]/mL — ABNORMAL HIGH (ref 0.35–5.50)

## 2023-08-08 MED ORDER — TRAMADOL HCL 50 MG PO TABS
50.0000 mg | ORAL_TABLET | Freq: Three times a day (TID) | ORAL | 0 refills | Status: AC | PRN
Start: 2023-08-08 — End: 2023-08-13

## 2023-08-08 MED ORDER — CYCLOBENZAPRINE HCL 10 MG PO TABS
10.0000 mg | ORAL_TABLET | Freq: Two times a day (BID) | ORAL | 0 refills | Status: DC | PRN
Start: 2023-08-08 — End: 2023-12-29

## 2023-08-08 MED ORDER — LEVOTHYROXINE SODIUM 125 MCG PO TABS: 125.0000 ug | ORAL_TABLET | Freq: Every day | ORAL | 3 refills | Status: DC

## 2023-08-08 NOTE — Telephone Encounter (Signed)
Please advise 

## 2023-08-08 NOTE — Addendum Note (Signed)
Addended by: Abbe Amsterdam C on: 08/08/2023 11:55 AM   Modules accepted: Orders

## 2023-09-22 ENCOUNTER — Ambulatory Visit (HOSPITAL_BASED_OUTPATIENT_CLINIC_OR_DEPARTMENT_OTHER)
Admission: RE | Admit: 2023-09-22 | Discharge: 2023-09-22 | Disposition: A | Discharge status: Home / Self Care | Payer: 59 | Source: Ambulatory Visit | Attending: Family Medicine | Admitting: Family Medicine

## 2023-09-22 ENCOUNTER — Encounter: Payer: Self-pay | Admitting: Family Medicine

## 2023-09-22 ENCOUNTER — Ambulatory Visit: Payer: 59 | Admitting: Family Medicine

## 2023-09-22 ENCOUNTER — Other Ambulatory Visit (INDEPENDENT_AMBULATORY_CARE_PROVIDER_SITE_OTHER): Payer: 59

## 2023-09-22 VITALS — BP 134/73 | HR 89 | Resp 18 | Ht 68.5 in | Wt 372.0 lb

## 2023-09-22 DIAGNOSIS — R1084 Generalized abdominal pain: Secondary | ICD-10-CM | POA: Insufficient documentation

## 2023-09-22 DIAGNOSIS — E039 Hypothyroidism, unspecified: Secondary | ICD-10-CM

## 2023-09-22 NOTE — Progress Notes (Signed)
Saw Cassandra Levy today and lab charges are in her office visit.

## 2023-09-22 NOTE — Progress Notes (Signed)
Acute Office Visit  Subjective:     Patient ID: Cassandra Levy, female    DOB: 09-03-1981, 42 y.o.   MRN: [id number]  Chief Complaint  Patient presents with   Abdominal Pain    Onset 2-3 weeks  L side lower abdominal discomfort, feels "tingly"  Pt states stomach feels "hard to the touch' From belly button and side     HPI Patient is in today for abdominal discomfort.   Discussed the use of AI scribe software for clinical note transcription with the patient, who gave verbal consent to proceed.  History of Present Illness   The patient, with a history of PCOS, presented with left-sided abdominal discomfort that has been ongoing for a few weeks. The discomfort began after a back injury in late August, for which they were prescribed a muscle relaxer and pain medication. The patient described the sensation as similar to the feeling of a limb waking up after falling asleep, with a pins and needles sensation followed by an odd sensation until it fully wakes up. The discomfort is located from the belly button around to the left side, sometimes extending to the lower abdomen. The patient reported tenderness and firmness in the abdomen.  The patient has been able to have bowel movements, although they reported some difficulty initially due to hardness of the stool. They do not have daily bowel movements and reported that during their menstrual cycle, they tend to have more frequent bowel movements. The patient just finished their menstrual cycle, which was of average severity and lasted slightly longer than usual at eight days.  The patient denied any urinary symptoms, nausea, vomiting, fevers, chills, body aches, or fatigue. The discomfort is not constant and comes and goes without any identifiable pattern or trigger. The patient reported that the frequency of the discomfort has increased recently. The patient denied any blood in their bowel movements and reported that their urine appears  normal.            ROS All review of systems negative except what is listed in the HPI      Objective:    BP 134/73   Pulse 89   Resp 18   Ht 5' 8.5" (1.74 m)   Wt (!) 372 lb (168.7 kg)   LMP 09/14/2023 (Exact Date)   SpO2 99%   BMI 55.74 kg/m    Physical Exam Vitals reviewed.  Constitutional:      Appearance: Normal appearance. She is obese.  Cardiovascular:     Rate and Rhythm: Normal rate and regular rhythm.     Heart sounds: Normal heart sounds.  Pulmonary:     Effort: Pulmonary effort is normal.     Breath sounds: Normal breath sounds.  Abdominal:     General: Bowel sounds are normal.     Palpations: Abdomen is soft.     Tenderness: There is no right CVA tenderness, left CVA tenderness or guarding.     Comments: Mild discomfort to left abdomen, no severe pain  Skin:    General: Skin is warm and dry.  Neurological:     Mental Status: She is alert and oriented to person, place, and time.  Psychiatric:        Mood and Affect: Mood normal.        Behavior: Behavior normal.        Thought Content: Thought content normal.        Judgment: Judgment normal.  No results found for any visits on 09/22/23.      Assessment & Plan:   Problem List Items Addressed This Visit   None Visit Diagnoses     Generalized abdominal pain    -  Primary   Relevant Orders   DG Abd 1 View   Comprehensive metabolic panel   CBC with Differential/Platelet   Lipase   Urinalysis w microscopic + reflex cultur         Pain described as an aching sensation, located from the umbilicus to the left side and sometimes lower abdomen. No urinary symptoms, nausea, or vomiting. Bowel movements irregular, with some difficulty passing stool initially. No fever or systemic symptoms.  -Order abdominal X-ray to assess for constipation. -Order CMP, CBC, and lipase to assess for other potential causes of abdominal pain. -Advise patient to take MiraLAX 1-2 times daily until  bowel movements are soft. -Recommend daily fiber supplement. -Advise patient to increase fluid intake and maintain physical activity. -Collect urine sample if possible. -If pain worsens or new symptoms develop, consider CT scan. -If severe pain advise patient to go to the hospital.        No orders of the defined types were placed in this encounter.   Return if symptoms worsen or fail to improve.  Clayborne Dana, NP

## 2023-09-23 ENCOUNTER — Encounter: Payer: Self-pay | Admitting: Family Medicine

## 2023-09-23 LAB — CBC WITH DIFFERENTIAL/PLATELET
Basophils Absolute: 0.1 10*3/uL (ref 0.0–0.1)
Basophils Relative: 0.9 % (ref 0.0–3.0)
Eosinophils Absolute: 0.2 10*3/uL (ref 0.0–0.7)
Eosinophils Relative: 2.4 % (ref 0.0–5.0)
HCT: 43.5 % (ref 36.0–46.0)
Hemoglobin: 14 g/dL (ref 12.0–15.0)
Lymphocytes Relative: 20.7 % (ref 12.0–46.0)
Lymphs Abs: 1.9 10*3/uL (ref 0.7–4.0)
MCHC: 32.1 g/dL (ref 30.0–36.0)
MCV: 90.7 fL (ref 78.0–100.0)
Monocytes Absolute: 0.6 10*3/uL (ref 0.1–1.0)
Monocytes Relative: 7 % (ref 3.0–12.0)
Neutro Abs: 6.2 10*3/uL (ref 1.4–7.7)
Neutrophils Relative %: 69 % (ref 43.0–77.0)
Platelets: 339 10*3/uL (ref 150.0–400.0)
RBC: 4.8 Mil/uL (ref 3.87–5.11)
RDW: 13.7 % (ref 11.5–15.5)
WBC: 8.9 10*3/uL (ref 4.0–10.5)

## 2023-09-23 LAB — COMPREHENSIVE METABOLIC PANEL
ALT: 16 U/L (ref 0–35)
AST: 12 U/L (ref 0–37)
Albumin: 3.9 g/dL (ref 3.5–5.2)
Alkaline Phosphatase: 83 U/L (ref 39–117)
BUN: 15 mg/dL (ref 6–23)
CO2: 29 meq/L (ref 19–32)
Calcium: 9.7 mg/dL (ref 8.4–10.5)
Chloride: 100 meq/L (ref 96–112)
Creatinine, Ser: 0.79 mg/dL (ref 0.40–1.20)
GFR: 92.38 mL/min (ref >60.00)
Glucose, Bld: 85 mg/dL (ref 70–99)
Potassium: 4 meq/L (ref 3.5–5.1)
Sodium: 138 meq/L (ref 135–145)
Total Bilirubin: 0.6 mg/dL (ref 0.2–1.2)
Total Protein: 6.8 g/dL (ref 6.0–8.3)

## 2023-09-23 LAB — TSH: TSH: 4.4 u[IU]/mL (ref 0.35–5.50)

## 2023-09-23 LAB — LIPASE: Lipase: 10 U/L — ABNORMAL LOW (ref 11.0–59.0)

## 2023-09-24 LAB — URINALYSIS W MICROSCOPIC + REFLEX CULTURE
Bacteria, UA: NONE SEEN /[HPF]
Bilirubin Urine: NEGATIVE
Glucose, UA: NEGATIVE
Hyaline Cast: NONE SEEN /LPF
Ketones, ur: NEGATIVE
Nitrites, Initial: NEGATIVE
Protein, ur: NEGATIVE
Specific Gravity, Urine: 1.013 (ref 1.001–1.035)
pH: 5.5 (ref 5.0–8.0)

## 2023-09-24 LAB — URINE CULTURE
MICRO NUMBER:: 15566290
Result:: NO GROWTH
SPECIMEN QUALITY:: ADEQUATE

## 2023-09-24 LAB — CULTURE INDICATED

## 2023-12-21 ENCOUNTER — Other Ambulatory Visit: Payer: Self-pay | Admitting: Family Medicine

## 2023-12-21 DIAGNOSIS — R600 Localized edema: Secondary | ICD-10-CM

## 2023-12-28 NOTE — Patient Instructions (Addendum)
 It was good to see you again today, I will be in touch with your labs as soon as possible If you would, as her insurance company if they cover a GLP-1 agonist drug for weight loss.  If it is not covered but if you would still like to pursue a GLP-1 (if okay to use during in vitro process) I might suggest going to [employer] or [employer] to obtain compounded/generic medication-more affordable

## 2023-12-28 NOTE — Progress Notes (Addendum)
 [facility] [address] [place], [place] [address] [phone]  Date:  12/29/2023   Name:  Cassandra Levy   DOB:  06/10/1981   MRN:  [id number]  PCP:  Watt Harlene BROCKS, MD    Chief Complaint: Annual Exam (Concerns/ questions: 1. [facility] has a weight loss drug that you swab in the cheek. 2. L eye irritation /Flu shot today:/)   History of Present Illness:  Cassandra Levy is a 43 y.o. very pleasant female patient who presents with the following:  Patient seen today for physical exam Most recent visit with myself was in August: History of obesity, hypothyroidism, PCOS, lower extremity edema  At her last visit she was working with a fertility specialist- pt notes she is not pregnant now  Her GYN is Dr. Barbette HCTZ 25 to 37.5 mg daily-varies dose according to her lower extremity swelling Levothyroxine  125  She also saw my partner Waddell Mon, nurse practitioner in October for concern of abdominal pain-lab work and abdominal x-ray at that time reassuring  She and her husband are looking at in vitro treatment-if this is not successful they will look into adoption  She wonders about a GLP-1; she would like to lose weight prior to pregnancy if possible which is a good idea No contraindication.  She will check with her insurance to see if this is covered.  If not she may go through an online service for contact compounding pharmacy  Lower extremity edema has been stable.  Overall she feels well but she is frustrated by inability to lose weight despite her best efforts  Seasonal flu vaccine-patient notes a localized allergic reaction a few years ago with redness and swelling of the arm but no systemic symptoms.  She is avoided flu shot since that time.  She is willing to go ahead and get her tetanus booster today Some blood work done in October   Patient Active Problem List   Diagnosis Date Noted    Degenerative tear of lateral meniscus of left knee 09/11/2022   Synovitis of left shoulder 08/07/2022   Morbid obesity (HCC) 10/03/2015   Hyperthyroidism 01/20/2014   PCOS (polycystic ovarian syndrome) 12/13/2013   Lower extremity edema 12/03/2013   Obesity 10/08/2012   Goiter 06/27/2012   Constipation 06/27/2012    Past Medical History:  Diagnosis Date   Allergy     Anemia    Thyroid  disease     Past Surgical History:  Procedure Laterality Date   CHOLECYSTECTOMY     COLPOSCOPY      Social History   Tobacco Use   Smoking status: Never   Smokeless tobacco: Never  Vaping Use   Vaping status: Never Used  Substance Use Topics   Alcohol use: No   Drug use: No    Family History  Problem Relation Age of Onset   Stroke Mother    Leukemia Other     Allergies  Allergen Reactions   Amoxicillin Swelling    Tightness in throat   Bee Venom    Cleocin  [Clindamycin  Hcl]     hives   Covid-19 (Mrna) Vaccine Swelling   Haemophilus Influenzae Vaccines Swelling    Medication list has been reviewed and updated.  Current Outpatient Medications on File Prior to Visit  Medication Sig Dispense Refill   Ascorbic Acid (VITAMIN C PO) Take by mouth.     Cholecalciferol (VITAMIN D3) 1.25 MG (50000 UT) CAPS  Take 1 weekly for 12 weeks 12 capsule 0   clotrimazole  (CLOTRIMAZOLE  AF) 1 % cream Apply 1 application topically 2 (two) times daily. 60 g 0   EPINEPHrine  (EPIPEN ) 0.3 mg/0.3 mL SOAJ injection Inject 0.3 mLs (0.3 mg total) into the muscle once. 2 Device 2   fluticasone  (FLONASE ) 50 MCG/ACT nasal spray Place 2 sprays into both nostrils daily. 16 g 6   hydrochlorothiazide  (HYDRODIURIL ) 25 MG tablet TAKE 1-1.5 TABLETS (25-37.5 MG TOTAL) BY MOUTH DAILY. 45 tablet 0   levothyroxine  (SYNTHROID ) 125 MCG tablet Take 1 tablet (125 mcg total) by mouth daily. 90 tablet 3   metFORMIN (GLUCOPHAGE-XR) 750 MG 24 hr tablet Take 750 mg by mouth daily.     norethindrone (AYGESTIN) 5 MG tablet Take  10 mg by mouth daily.      Prenatal Vit-Fe Fumarate-FA (PRENATAL MULTIVITAMIN) TABS tablet Take 1 tablet by mouth daily at 12 noon.     No current facility-administered medications on file prior to visit.    Review of Systems:  As per HPI- otherwise negative.   Physical Examination: Vitals:   12/29/23 1434  BP: 128/80  Pulse: 84  Resp: 18  Temp: 98.2 F (36.8 C)  SpO2: 98%   Vitals:   12/29/23 1434  Weight: (!) 382 lb (173.3 kg)  Height: 5' 8.5 (1.74 m)   Body mass index is 57.24 kg/m. Ideal Body Weight: Weight in (lb) to have BMI = 25: 166.5  GEN: no acute distress.  Obese, looks well  HEENT: Atraumatic, Normocephalic.  Bilateral TM wnl, oropharynx normal.  PEERL,EOMI.   Ears and Nose: No external deformity. CV: RRR, No M/G/R. No JVD. No thrill. No extra heart sounds. PULM: CTA B, no wheezes, crackles, rhonchi. No retractions. No resp. distress. No accessory muscle use. ABD: S, NT, ND, +BS. No rebound. No HSM. EXTR: No c/c/e PSYCH: Normally interactive. Conversant.    Assessment and Plan: Physical exam  Screening for diabetes mellitus - Plan: Hemoglobin A1c  Thyroid  disorder screening - Plan: TSH  Vitamin D  deficiency - Plan: VITAMIN D  25 Hydroxy (Vit-D Deficiency, Fractures)  Screening, lipid - Plan: Lipid panel  Immunization due - Plan: Td vaccine greater than or equal to 7yo preservative free IM  Patient seen today for physical exam.  Encouraged healthy diet and exercise routine Will plan further follow- up pending labs. Tetanus booster given Discussed her weight, she is looking at the possibility of a GLP-1 agonist drug  Signed Harlene Schroeder, MD Addendum 1/14, received labs as below.  Message to patient  Results for orders placed or performed in visit on 12/29/23  Hemoglobin A1c   Collection Time: 12/29/23  3:16 PM  Result Value Ref Range   Hgb A1c MFr Bld 5.9 4.6 - 6.5 %  Lipid panel   Collection Time: 12/29/23  3:16 PM  Result Value Ref  Range   Cholesterol 207 (H) 0 - 200 mg/dL   Triglycerides 06.9 0.0 - 149.0 mg/dL   HDL 51.59 >60.99 mg/dL   VLDL 81.3 0.0 - 59.9 mg/dL   LDL Cholesterol 859 (H) 0 - 99 mg/dL   Total CHOL/HDL Ratio 4    NonHDL 159.08   TSH   Collection Time: 12/29/23  3:16 PM  Result Value Ref Range   TSH 5.48 0.35 - 5.50 uIU/mL  VITAMIN D  25 Hydroxy (Vit-D Deficiency, Fractures)   Collection Time: 12/29/23  3:16 PM  Result Value Ref Range   VITD 25.84 (L) 30.00 - 100.00 ng/mL

## 2023-12-29 ENCOUNTER — Ambulatory Visit (INDEPENDENT_AMBULATORY_CARE_PROVIDER_SITE_OTHER): Payer: 59 | Admitting: Family Medicine

## 2023-12-29 VITALS — BP 128/80 | HR 84 | Temp 98.2°F | Resp 18 | Ht 68.5 in | Wt 382.0 lb

## 2023-12-29 DIAGNOSIS — Z0000 Encounter for general adult medical examination without abnormal findings: Secondary | ICD-10-CM | POA: Diagnosis not present

## 2023-12-29 DIAGNOSIS — E559 Vitamin D deficiency, unspecified: Secondary | ICD-10-CM

## 2023-12-29 DIAGNOSIS — Z23 Encounter for immunization: Secondary | ICD-10-CM | POA: Diagnosis not present

## 2023-12-29 DIAGNOSIS — Z1329 Encounter for screening for other suspected endocrine disorder: Secondary | ICD-10-CM | POA: Diagnosis not present

## 2023-12-29 DIAGNOSIS — Z13220 Encounter for screening for lipoid disorders: Secondary | ICD-10-CM | POA: Diagnosis not present

## 2023-12-29 DIAGNOSIS — Z131 Encounter for screening for diabetes mellitus: Secondary | ICD-10-CM

## 2023-12-30 ENCOUNTER — Encounter: Payer: Self-pay | Admitting: Family Medicine

## 2023-12-30 LAB — LIPID PANEL
Cholesterol: 207 mg/dL — ABNORMAL HIGH (ref 0–200)
HDL: 48.4 mg/dL (ref >39.00)
LDL Cholesterol: 140 mg/dL — ABNORMAL HIGH (ref 0–99)
NonHDL: 159.08
Total CHOL/HDL Ratio: 4
Triglycerides: 93 mg/dL (ref 0.0–149.0)
VLDL: 18.6 mg/dL (ref 0.0–40.0)

## 2023-12-30 LAB — VITAMIN D 25 HYDROXY (VIT D DEFICIENCY, FRACTURES): VITD: 25.84 ng/mL — ABNORMAL LOW (ref 30.00–100.00)

## 2023-12-30 LAB — HEMOGLOBIN A1C: Hgb A1c MFr Bld: 5.9 % (ref 4.6–6.5)

## 2023-12-30 LAB — TSH: TSH: 5.48 u[IU]/mL (ref 0.35–5.50)

## 2024-01-18 ENCOUNTER — Other Ambulatory Visit: Payer: Self-pay | Admitting: Family Medicine

## 2024-01-18 DIAGNOSIS — R600 Localized edema: Secondary | ICD-10-CM

## 2024-02-21 ENCOUNTER — Other Ambulatory Visit: Payer: Self-pay | Admitting: Family Medicine

## 2024-02-21 DIAGNOSIS — R600 Localized edema: Secondary | ICD-10-CM

## 2024-03-21 ENCOUNTER — Other Ambulatory Visit: Payer: Self-pay | Admitting: Family Medicine

## 2024-03-21 DIAGNOSIS — R600 Localized edema: Secondary | ICD-10-CM

## 2024-04-17 ENCOUNTER — Other Ambulatory Visit: Payer: Self-pay | Admitting: Family Medicine

## 2024-04-17 DIAGNOSIS — R600 Localized edema: Secondary | ICD-10-CM

## 2024-05-11 ENCOUNTER — Ambulatory Visit: Payer: Self-pay

## 2024-05-11 NOTE — Telephone Encounter (Signed)
 Chief Complaint: Thumb, right Symptoms: pain Frequency: x 2 weeks Pertinent Negatives: Patient denies swelling, redness, warmth Disposition: [] ED /[] Urgent Care (no appt availability in office) / [x] Appointment(In office/virtual)/ []  [facility]/ [] Home Care/ [] Refused Recommended Disposition /[] [facility]/ []  Follow-up with PCP Additional Notes: Pt reports she awoke about 2 weeks ago with right thumb pain, notes she had no known injury. Pt reports 6/10 pain at worst, decrease in fine motor function. OV scheduled. This RN educated pt on home care, new-worsening symptoms, when to call back/seek emergent care. Pt verbalized understanding and agrees to plan.    Copied from CRM [id number]. Topic: Clinical - Red Word Triage >> May 11, 2024  9:05 AM Cassandra Levy wrote: Red Word that prompted transfer to Nurse Triage: patient injured right thumb a couple weeks ago, not getting better, can't open a bottle of water, can hold a cup of water.  Patient phone number [phone] Reason for Disposition  Finger joint can't be opened (straightened) or closed (bent) completely  (Note: injured person should be able to do this without assistance)  Answer Assessment - Initial Assessment Questions 1. MECHANISM: "How did the injury happen?"      Unknown, awoke with the pain about 2 weeks ago 2. ONSET: "When did the injury happen?" (Minutes or hours ago)      X 2 weeks 3. LOCATION: "What part of the finger is injured?" "Is the nail damaged?"      Right thumb 4. APPEARANCE of the INJURY: "What does the injury look like?"      WNL 7. PAIN: "Is there pain?" If Yes, ask: "How bad is the pain?"    (e.g., Scale 1-10; or mild, moderate, severe)  - NONE (0): no pain.  - MILD (1-3): doesn't interfere with normal activities.   - MODERATE (4-7): interferes with normal activities or awakens from sleep.  - SEVERE (8-10): excruciating pain, unable to hold a glass of water or bend finger even a little.      6/10 9. OTHER SYMPTOMS: "Do you have any other symptoms?"     Decreased fine motor, decreased ROM  Protocols used: Finger Injury-A-AH

## 2024-05-12 ENCOUNTER — Ambulatory Visit: Admitting: Family Medicine

## 2024-05-12 ENCOUNTER — Encounter: Payer: Self-pay | Admitting: Family Medicine

## 2024-05-12 VITALS — BP 128/70 | HR 110 | Temp 98.0°F | Ht 68.5 in | Wt 385.0 lb

## 2024-05-12 DIAGNOSIS — E0590 Thyrotoxicosis, unspecified without thyrotoxic crisis or storm: Secondary | ICD-10-CM | POA: Diagnosis not present

## 2024-05-12 DIAGNOSIS — Z1329 Encounter for screening for other suspected endocrine disorder: Secondary | ICD-10-CM

## 2024-05-12 DIAGNOSIS — B356 Tinea cruris: Secondary | ICD-10-CM

## 2024-05-12 DIAGNOSIS — E039 Hypothyroidism, unspecified: Secondary | ICD-10-CM | POA: Diagnosis not present

## 2024-05-12 DIAGNOSIS — M25541 Pain in joints of right hand: Secondary | ICD-10-CM | POA: Diagnosis not present

## 2024-05-12 MED ORDER — TERBINAFINE HCL 250 MG PO TABS: 250.0000 mg | ORAL_TABLET | Freq: Every day | ORAL | 0 refills | Status: DC

## 2024-05-12 NOTE — Progress Notes (Signed)
 [facility] [address], [address] [place], [place] [address] [phone]  Date:  05/12/2024   Name:  Cassandra Levy   DOB:  Apr 30, 1981   MRN:  [id number]  PCP:  Kaylee Partridge, MD    Chief Complaint: R thumb pain (X2-3wks; woke up one morning and it felt like I maybe slept on it wrong; getting worse by the days; hard to bend or straighten it all the way; swollen, hurts to put pressure on it; very tender at base; "cant even use it to open water bottle"; taking advil  but not helping)   History of Present Illness:  Cassandra Levy is a 43 y.o. very pleasant female patient who presents with the following:  Pt seen today with right thumb pain for 2-3 weeks She does not remember any acute injury- her thumb hurt when she woke up one morning Not getting better but not necessarily worse It feels stiff and sore It may catch and then "pop" to straighten It is a bit swollen She has never had prior trigger finger or other major issue with her hands She tried taking some advil - did not seem to help   She also notes a persistent tinea infection between her thighs.  It will clear up temporarily with topical cream but then comes back.  She wonders if there might be an oral therapy which would be helpful  She and her husband are considering IVF, she is not currently pregnant however Patient Active Problem List   Diagnosis Date Noted   Degenerative tear of lateral meniscus of left knee 09/11/2022   Synovitis of left shoulder 08/07/2022   Morbid obesity (HCC) 10/03/2015   Hyperthyroidism 01/20/2014   PCOS (polycystic ovarian syndrome) 12/13/2013   Lower extremity edema 12/03/2013   Obesity 10/08/2012   Goiter 06/27/2012   Constipation 06/27/2012    Past Medical History:  Diagnosis Date   Allergy     Anemia    Thyroid  disease     Past Surgical History:  Procedure Laterality Date   CHOLECYSTECTOMY     COLPOSCOPY       Social History   Tobacco Use   Smoking status: Never   Smokeless tobacco: Never  Vaping Use   Vaping status: Never Used  Substance Use Topics   Alcohol use: No   Drug use: No    Family History  Problem Relation Age of Onset   Stroke Mother    Leukemia Other     Allergies  Allergen Reactions   Amoxicillin Swelling    Tightness in throat   Bee Venom    Cleocin  [Clindamycin  Hcl]     hives   Fluzone [Influenza Vac Split Quad]     Swelling and redness of arm     Medication list has been reviewed and updated.  Current Outpatient Medications on File Prior to Visit  Medication Sig Dispense Refill   Ascorbic Acid (VITAMIN C PO) Take by mouth.     Cholecalciferol (VITAMIN D3) 1.25 MG (50000 UT) CAPS Take 1 weekly for 12 weeks 12 capsule 0   EPINEPHrine  (EPIPEN ) 0.3 mg/0.3 mL SOAJ injection Inject 0.3 mLs (0.3 mg total) into the muscle once. 2 Device 2   fluticasone  (FLONASE ) 50 MCG/ACT nasal spray Place 2 sprays into both nostrils daily. 16 g 6   hydrochlorothiazide  (HYDRODIURIL ) 25 MG tablet Take 1-1.5 tablets (25-37.5 mg total) by mouth daily. 45 tablet 2   levothyroxine  (SYNTHROID ) 125 MCG  tablet Take 1 tablet (125 mcg total) by mouth daily. 90 tablet 3   metFORMIN (GLUCOPHAGE-XR) 750 MG 24 hr tablet Take 750 mg by mouth daily.     norethindrone (AYGESTIN) 5 MG tablet Take 10 mg by mouth daily.      Prenatal Vit-Fe Fumarate-FA (PRENATAL MULTIVITAMIN) TABS tablet Take 1 tablet by mouth daily at 12 noon.     clotrimazole  (CLOTRIMAZOLE  AF) 1 % cream Apply 1 application topically 2 (two) times daily. 60 g 0   No current facility-administered medications on file prior to visit.    Review of Systems:  As per HPI- otherwise negative.   Physical Examination: Vitals:   05/12/24 1411  BP: 128/70  Pulse: (!) 110  Temp: 98 F (36.7 C)  SpO2: 97%   Vitals:   05/12/24 1411  Weight: (!) 385 lb (174.6 kg)  Height: 5' 8.5" (1.74 m)   Body mass index is 57.69  kg/m. Ideal Body Weight: Weight in (lb) to have BMI = 25: 166.5  GEN: No acute distress; alert,appropriate. PULM: Breathing comfortably in no respiratory distress PSYCH: Normally interactive.  Recheck pulse, approximately 80 bpm Right thumb is tender at the proximal phalanx and IP joint.  There is barely perceivable swelling of the thumb.  It is tender most on the palmar aspect and there may be a trigger finger Otherwise the hand, wrist are normal.  No sign of cellulitis Tinea cruris is present in the groin area  Assessment and Plan: Pain in thumb joint with movement of right hand - Plan: Ambulatory referral to Orthopedic Surgery  Thyroid  disorder screening  Hyperthyroidism - Plan: TSH  Tinea cruris - Plan: terbinafine  (LAMISIL ) 250 MG tablet  Patient seen today to discuss an issue with her thumb joint.  She may have a trigger finger.  Her thumb is significantly impacting her daily activities, referral made to orthopedic surgery.  I suggested she try a thumb spica splint as needed for comfort in the meantime Follow-up on thyroid  today Terbinafine  by mouth for 2 weeks    Signed Gates Kasal, MD  Received her TSH 5/29- message to pt  Results for orders placed or performed in visit on 05/12/24  TSH   Collection Time: 05/12/24  2:42 PM  Result Value Ref Range   TSH 6.47 (H) 0.35 - 5.50 uIU/mL   She is on 125 levothyroxine - will increase her to 150 mg and repeat TSH in 6 weeks

## 2024-05-12 NOTE — Patient Instructions (Signed)
 We will get you set up for an orthopedics appt You can give them a call if you like!   Try a "thumb spica" splint- see if this helps with pain Let's try oral antifungal- terbinafine- daily for 2 weeks

## 2024-05-13 ENCOUNTER — Encounter: Payer: Self-pay | Admitting: Family Medicine

## 2024-05-13 LAB — TSH: TSH: 6.47 u[IU]/mL — ABNORMAL HIGH (ref 0.35–5.50)

## 2024-05-13 MED ORDER — LEVOTHYROXINE SODIUM 150 MCG PO TABS: 150.0000 ug | ORAL_TABLET | Freq: Every day | ORAL | 3 refills | Status: DC

## 2024-05-13 NOTE — Addendum Note (Signed)
 Addended by: Kaylee Partridge on: 05/13/2024 04:35 PM   Modules accepted: Orders

## 2024-05-14 ENCOUNTER — Ambulatory Visit: Admitting: Physician Assistant

## 2024-05-14 ENCOUNTER — Encounter: Payer: Self-pay | Admitting: Physician Assistant

## 2024-05-14 ENCOUNTER — Other Ambulatory Visit (INDEPENDENT_AMBULATORY_CARE_PROVIDER_SITE_OTHER): Payer: Self-pay

## 2024-05-14 DIAGNOSIS — G8929 Other chronic pain: Secondary | ICD-10-CM

## 2024-05-14 DIAGNOSIS — M79645 Pain in left finger(s): Secondary | ICD-10-CM

## 2024-05-14 DIAGNOSIS — M65311 Trigger thumb, right thumb: Secondary | ICD-10-CM | POA: Diagnosis not present

## 2024-05-14 MED ORDER — METHYLPREDNISOLONE ACETATE 40 MG/ML IJ SUSP
20.0000 mg | INTRAMUSCULAR | Status: AC | PRN
  Administered 2024-05-14: 20 mg

## 2024-05-14 MED ORDER — LIDOCAINE HCL 1 % IJ SOLN
0.5000 mL | INTRAMUSCULAR | Status: AC | PRN
Start: 2024-05-14 — End: 2024-05-14
  Administered 2024-05-14: .5 mL

## 2024-05-14 NOTE — Progress Notes (Signed)
 Office Visit Note   Patient: Cassandra Levy           Date of Birth: 1981/01/02           MRN: [id number] Visit Date: 05/14/2024              Requested by: Kaylee Partridge, MD [address] [place],  [place] [address] PCP: Kaylee Partridge, MD   Assessment & Plan: Visit Diagnoses: Trigger finger right thumb   Plan: Pleasant right-hand-dominant 43 year old woman with a 3-week history of right thumb pain and her thumb "locking up ".  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  Evaluation today favored trigger finger of the right thumb.  Talked about the natural history of this we will go forward with an injection and see how she does also she has a brace I told her to continue to wear this.  We have given her light duties for the next week may follow-up if no improvement in a week  Follow-Up Instructions: No follow-ups on file.   Orders:  Orders Placed This Encounter  Procedures  . XR Finger Thumb Right   No orders of the defined types were placed in this encounter.     Procedures: Hand/UE Inj for trigger finger on 05/14/2024 11:25 AM Indications: diagnostic and therapeutic Details: 25 G needle, volar approach Medications: 0.5 mL lidocaine 1 %; 20 mg methylPREDNISolone acetate 40 MG/ML Outcome: tolerated well, no immediate complications Procedure, treatment alternatives, risks and benefits explained, specific risks discussed. Consent was given by the patient. Immediately prior to procedure a time out was called to verify the correct patient, procedure, equipment, support staff and site/side marked as required. Patient was prepped and draped in the usual sterile fashion.     Clinical Data: No additional findings.   Subjective: No chief complaint on file.   HPI patient is a pleasant 43 year old woman presents today with a 3-week history of pain and swelling in her thumb no history of  injury no history of infection  Review of Systems  All other systems reviewed and are negative.    Objective: Vital Signs: There were no vitals taken for this visit.  Physical Exam Constitutional:      Appearance: Normal appearance.  Skin:    General: Skin is warm and dry.  Neurological:     General: No focal deficit present.     Mental Status: She is alert and oriented to person, place, and time.  Psychiatric:        Mood and Affect: Mood normal.        Behavior: Behavior normal.    Ortho Exam She is focally tender at the A1 pulley at the base of the thumb.  Mild soft tissue swelling she is neurovascular intact with brisk capillary refill no evidence of infection Specialty Comments:  No specialty comments available.  Imaging: XR Finger Thumb Right Result Date: 05/14/2024 Minimal  cystic changes very early degeneration first [facility] joint otherwise normal x-ray    PMFS History: Patient Active Problem List   Diagnosis Date Noted  . Trigger finger of right thumb 05/14/2024  . Degenerative tear of lateral meniscus of left knee 09/11/2022  . Synovitis of left shoulder 08/07/2022  . Morbid obesity (HCC) 10/03/2015  . Hyperthyroidism 01/20/2014  . PCOS (polycystic ovarian syndrome) 12/13/2013  . Lower extremity edema 12/03/2013  . Obesity 10/08/2012  . Goiter  06/27/2012  . Constipation 06/27/2012   Past Medical History:  Diagnosis Date  . Allergy    . Anemia   . Thyroid  disease     Family History  Problem Relation Age of Onset  . Stroke Mother   . Leukemia Other     Past Surgical History:  Procedure Laterality Date  . CHOLECYSTECTOMY    . COLPOSCOPY     Social History   Occupational History  . Not on file  Tobacco Use  . Smoking status: Never  . Smokeless tobacco: Never  Vaping Use  . Vaping status: Never Used  Substance and Sexual Activity  . Alcohol use: No  . Drug use: No  . Sexual activity: Yes

## 2024-05-28 ENCOUNTER — Encounter: Payer: Self-pay | Admitting: Physician Assistant

## 2024-05-28 ENCOUNTER — Ambulatory Visit: Admitting: Physician Assistant

## 2024-05-28 DIAGNOSIS — M65311 Trigger thumb, right thumb: Secondary | ICD-10-CM | POA: Diagnosis not present

## 2024-05-28 NOTE — Progress Notes (Signed)
 Office Visit Note   Patient: Cassandra Levy           Date of Birth: October 11, 1981           MRN: [id number] Visit Date: 05/28/2024              Requested by: Kaylee Partridge, MD 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  She is now 5 weeks status post locking of her right thumb.  She does recall that she had a hyperextension type injury.  At her last visit she had findings consistent with a trigger thumb.  I did inject her.  She did say that helped some and she is able to move her finger but it still continues to be weak and painful.    Assessment & Plan: Visit Diagnoses:  1. Trigger finger of right thumb     Plan: 6 weeks status post hyperextension injury to the thumb.  She did not get significantly better with an injection this is now been going on 6 weeks she has been immobilized and had an injection with minimal improvement in her thumb is weak.  This is her dominant hand.  I recommend an MRI we will review it with Dr. Margarite Shearer  Follow-Up Instructions: Pending MRI results  Ortho Exam  Patient is alert, oriented, no adenopathy, well-dressed, normal affect, normal respiratory effort. Examination of her right thumb she does have some improved passive movement she still has a lot of pain at the volar base of the thumb.  With extension and flexion.  No tenderness in her wrist or the first dorsal compartment neurovascular intact grip strength is decreased because of the pain in her thumb    Imaging: No results found. No images are attached to the encounter.  Labs: Lab Results  Component Value Date   HGBA1C 5.9 12/29/2023   HGBA1C 5.7 12/25/2022   HGBA1C 5.6 12/24/2021   ESRSEDRATE 34 (H) 08/07/2022   CRP 8 08/07/2022   LABURIC 6.1  08/11/2020   LABORGA Normal Upper Respiratory Flora 08/05/2016   LABORGA No Beta Hemolytic Streptococci Isolated 08/05/2016     Lab Results  Component Value Date   ALBUMIN 3.9 09/22/2023   ALBUMIN 4.0 12/25/2022   ALBUMIN 4.3 12/24/2021    No results found for: MG Lab Results  Component Value Date   VD25OH 25.84 (L) 12/29/2023   VD25OH 27.40 (L) 12/25/2022   VD25OH 21.23 (L) 12/24/2021    No results found for: PREALBUMIN    Latest Ref Rng & Units 09/22/2023    1:49 PM 12/25/2022    3:19 PM 12/24/2021    3:32 PM  CBC EXTENDED  WBC 4.0 - 10.5 K/uL 8.9  8.9  11.3   RBC 3.87 - 5.11 Mil/uL 4.80  4.54  4.78   Hemoglobin 12.0 - 15.0 g/dL 19.1  47.8  29.5   HCT 36.0 - 46.0 % 43.5  40.6  43.0   Platelets 150.0 - 400.0 K/uL 339.0  354.0  398.0   NEUT# 1.4 - 7.7 K/uL 6.2     Lymph# 0.7 - 4.0 K/uL 1.9        There is no height or weight on file to calculate BMI.  Orders:  Orders  Placed This Encounter  Procedures   MR Wrist Right w/o contrast   No orders of the defined types were placed in this encounter.    Procedures: No procedures performed  Clinical Data: No additional findings.  ROS:  All other systems negative, except as noted in the HPI. Review of Systems  Objective: Vital Signs: There were no vitals taken for this visit.  Specialty Comments:  No specialty comments available.  PMFS History: Patient Active Problem List   Diagnosis Date Noted   Trigger finger of right thumb 05/14/2024   Degenerative tear of lateral meniscus of left knee 09/11/2022   Synovitis of left shoulder 08/07/2022   Morbid obesity (HCC) 10/03/2015   Hyperthyroidism 01/20/2014   PCOS (polycystic ovarian syndrome) 12/13/2013   Lower extremity edema 12/03/2013   Obesity 10/08/2012   Goiter 06/27/2012   Constipation 06/27/2012   Past Medical History:  Diagnosis Date   Allergy     Anemia    Thyroid  disease     Family History  Problem Relation Age of Onset   Stroke Mother     Leukemia Other     Past Surgical History:  Procedure Laterality Date   CHOLECYSTECTOMY     COLPOSCOPY     Social History   Occupational History   Not on file  Tobacco Use   Smoking status: Never   Smokeless tobacco: Never  Vaping Use   Vaping status: Never Used  Substance and Sexual Activity   Alcohol use: No   Drug use: No   Sexual activity: Yes

## 2024-06-13 ENCOUNTER — Ambulatory Visit
Admission: RE | Admit: 2024-06-13 | Discharge: 2024-06-13 | Disposition: A | Discharge status: Home / Self Care | Source: Ambulatory Visit | Attending: Physician Assistant | Admitting: Physician Assistant

## 2024-06-13 ENCOUNTER — Other Ambulatory Visit: Payer: Self-pay | Admitting: Physician Assistant

## 2024-06-13 DIAGNOSIS — M65311 Trigger thumb, right thumb: Secondary | ICD-10-CM

## 2024-06-28 ENCOUNTER — Ambulatory Visit: Admitting: Orthopedic Surgery

## 2024-06-28 DIAGNOSIS — M65311 Trigger thumb, right thumb: Secondary | ICD-10-CM | POA: Diagnosis not present

## 2024-06-28 NOTE — Progress Notes (Signed)
 Matilyn Fehrman Whitsitt - 43 y.o. female MRN [id number]  Date of birth: [birth year]  Office Visit Note: Visit Date: 06/28/2024 PCP: Watt Harlene BROCKS, MD Referred by: Watt Harlene BROCKS, MD  Subjective: No chief complaint on file.  HPI: Roselynn Whitacre Pasquini is a pleasant 43 y.o. female who presents today for evaluation of right thumb stiffness at the MCP region with associated clicking and catching that is been present now for multiple months.  She states that her pain has persisted despite prior injection, she is developing increasing stiffness in this region.  She was seen previously by [facility] persons and underwent injection in May of this year.  Has also trialed bracing without significant relief.  Denies any significant numbness or tingling.  She does do a significant amount of computer work which she feels may be contributing to her symptoms.  She is being seen by myself today for specific hand surgical evaluation.  Pertinent ROS were reviewed with the patient and found to be negative unless otherwise specified above in HPI.    Assessment & Plan: Visit Diagnoses:  1. Trigger thumb, right thumb     Plan: Extensive discussion was had with the patient today regarding her right thumb stenosing tenosynovitis.  I reviewed her MRI as well which is consistent with this finding.  We discussed the etiology and pathophysiology of stenosing tenosynovitis.  We discussed conservative versus surgical treatment modalities.  From a conservative standpoint, we discussed activity modification, splinting, therapy and injections.  From a surgical standpoint, we discussed the possibility for trigger digit release as well as all risk and benefits associated.  Given that patient has trialed conservative treatments such as bracing, activity modification and prior injection with symptoms refractory to conservative care, patient is indicated for right thumb trigger digit release.    Risks and benefits of the  procedure were discussed, risks including but not limited to infection, bleeding, scarring, stiffness, nerve injury, tendon injury, vascular injury, recurrence of symptoms and need for subsequent operation.  We also discussed the appropriate postoperative protocol and timeframe for return to activities and function.  Forms of anesthesia were also discussed.  Patient expressed understanding.  Understanding the above, she would like to proceed with right thumb trigger digit release under IV sedation at the next available date.   Follow-up: No follow-ups on file.   Meds & Orders: No orders of the defined types were placed in this encounter.  No orders of the defined types were placed in this encounter.    Procedures: No procedures performed      Clinical History: No specialty comments available.  She reports that she has never smoked. She has never used smokeless tobacco.  Recent Labs    12/29/23 1516  HGBA1C 5.9    Objective:   Vital Signs: There were no vitals taken for this visit.  Physical Exam  Gen: Well-appearing, in no acute distress; non-toxic CV: Regular Rate. Well-perfused. Warm.  Resp: Breathing unlabored on room air; no wheezing. Psych: Fluid speech in conversation; appropriate affect; normal thought process  Ortho Exam Right hand: - Palpable nodule at the A1 pulley of the thumb, associated tenderness - Notable clicking with deep flexion of the thumb, range of motion is significantly limited secondary to pain, 0-15 at IP joint - Sensation intact distally, hand remains warm well-perfused   Imaging: No results found. Prior MRI was independently reviewed in detail of the right hand  Past Medical/Family/Surgical/Social History: Medications & Allergies reviewed per EMR, new medications updated. Patient  Active Problem List   Diagnosis Date Noted   Trigger finger of right thumb 05/14/2024   Degenerative tear of lateral meniscus of left knee 09/11/2022   Synovitis of  left shoulder 08/07/2022   Morbid obesity (HCC) 10/03/2015   Hyperthyroidism 01/20/2014   PCOS (polycystic ovarian syndrome) 12/13/2013   Lower extremity edema 12/03/2013   Obesity 10/08/2012   Goiter 06/27/2012   Constipation 06/27/2012   Past Medical History:  Diagnosis Date   Allergy     Anemia    Thyroid  disease    Family History  Problem Relation Age of Onset   Stroke Mother    Leukemia Other    Past Surgical History:  Procedure Laterality Date   CHOLECYSTECTOMY     COLPOSCOPY     Social History   Occupational History   Not on file  Tobacco Use   Smoking status: Never   Smokeless tobacco: Never  Vaping Use   Vaping status: Never Used  Substance and Sexual Activity   Alcohol use: No   Drug use: No   Sexual activity: Yes    Damarie Schoolfield Afton Alderton, M.D. [facility], Hand Surgery

## 2024-07-06 ENCOUNTER — Other Ambulatory Visit: Payer: Self-pay

## 2024-07-06 DIAGNOSIS — M65311 Trigger thumb, right thumb: Secondary | ICD-10-CM

## 2024-07-08 DIAGNOSIS — M65311 Trigger thumb, right thumb: Secondary | ICD-10-CM | POA: Diagnosis not present

## 2024-07-12 ENCOUNTER — Telehealth: Payer: Self-pay | Admitting: Orthopedic Surgery

## 2024-07-12 ENCOUNTER — Telehealth: Payer: Self-pay

## 2024-07-12 NOTE — Telephone Encounter (Signed)
 Patient called. States she had surgery 07/08/2024. Would like to know if she can change her dressing. Also is she able to drive. States she's only taking Ibuprofen . Would like to return to work- light duty.   CB [phone]

## 2024-07-12 NOTE — Telephone Encounter (Signed)
 Note done, see other message

## 2024-07-12 NOTE — Telephone Encounter (Signed)
 Pt advised ok to remove dressing tomorrow, ok to drive if off pain meds and can do it safely and work note sent to mychart

## 2024-07-12 NOTE — Telephone Encounter (Signed)
 Patient called and said she needs a note for her job for out of work. She said she needs lite duty and no lifting. [phone]

## 2024-07-13 ENCOUNTER — Ambulatory Visit (INDEPENDENT_AMBULATORY_CARE_PROVIDER_SITE_OTHER): Admitting: Orthopedic Surgery

## 2024-07-13 ENCOUNTER — Telehealth: Payer: Self-pay

## 2024-07-13 DIAGNOSIS — M65311 Trigger thumb, right thumb: Secondary | ICD-10-CM

## 2024-07-13 NOTE — Progress Notes (Signed)
 Patient came in today for a wound check s/p right thumb trigger release. Stated she was bleeding. Sutures were still intact and incision looked good. I re-wrapped the thumb and gave stuff to do dressing changes as needed. She will follow-up in two weeks

## 2024-07-13 NOTE — Telephone Encounter (Signed)
 Called and told pt to come on in to be looked at

## 2024-07-14 ENCOUNTER — Telehealth: Payer: Self-pay | Admitting: Radiology

## 2024-07-14 NOTE — Telephone Encounter (Signed)
 Patient call was transferred to my phone and left message, I was out of the office on 07/13/24 when message was left. She is calling states that she was to remove dressing and that when she cleaned around the incision she started bleeding, she states that she redressed the incision area and wants a call back to see what she should do.    Looks like this was addressed yesterday but wanted to document that this was sent to the wrong extension should have been sent to Triage, not to a voicemail.  Transferred by extension [phone]

## 2024-07-14 NOTE — Telephone Encounter (Signed)
 Noted

## 2024-07-15 ENCOUNTER — Ambulatory Visit: Admitting: Orthopedic Surgery

## 2024-07-19 ENCOUNTER — Other Ambulatory Visit: Payer: Self-pay | Admitting: Family Medicine

## 2024-07-19 DIAGNOSIS — R600 Localized edema: Secondary | ICD-10-CM

## 2024-07-21 ENCOUNTER — Encounter: Admitting: Rehabilitative and Restorative Service Providers"

## 2024-07-21 ENCOUNTER — Encounter: Admitting: Orthopedic Surgery

## 2024-07-22 ENCOUNTER — Ambulatory Visit: Admitting: Orthopedic Surgery

## 2024-07-22 ENCOUNTER — Encounter: Admitting: Orthopedic Surgery

## 2024-07-22 DIAGNOSIS — Z9889 Other specified postprocedural states: Secondary | ICD-10-CM

## 2024-07-22 NOTE — Therapy (Signed)
 OUTPATIENT OCCUPATIONAL THERAPY ORTHO EVALUATION AND DISCHARGE NOTE  Patient Name: Cassandra Levy MRN: [id number] DOB:18-Sep-1981, 43 y.o., female Today's Date: 07/23/2024  REFERRING PROVIDER: Arlinda Buster, MD   END OF SESSION:   Past Medical History:  Diagnosis Date   Allergy     Anemia    Thyroid  disease    Past Surgical History:  Procedure Laterality Date   CHOLECYSTECTOMY     COLPOSCOPY     Patient Active Problem List   Diagnosis Date Noted   Trigger finger of right thumb 05/14/2024   Degenerative tear of lateral meniscus of left knee 09/11/2022   Synovitis of left shoulder 08/07/2022   Morbid obesity (HCC) 10/03/2015   Hyperthyroidism 01/20/2014   PCOS (polycystic ovarian syndrome) 12/13/2013   Lower extremity edema 12/03/2013   Obesity 10/08/2012   Goiter 06/27/2012   Constipation 06/27/2012     ONSET DATE:  DOS 07/08/24  REFERRING DIAG: M65.311 (ICD-10-CM) - Trigger thumb, right thumb   THERAPY DIAG:     Localized edema  Muscle weakness (generalized)  Other lack of coordination  Pain in right hand  Rationale for Evaluation and Treatment: Rehabilitation  SUBJECTIVE:   SUBJECTIVE STATEMENT: The patient states approx 4 month hx of triggering and pain in her right thumb and subsequent surgical release. The patient states having some mild stiffness, pain, decreased ability to make a fist and perform I/ADLs.     PERTINENT HISTORY: The patient is now approx 2 weeks s/p right thumb trigger release.   PRECAUTIONS: None relative to this evaluation and episode of care.   RED FLAGS: None   WEIGHT BEARING RESTRICTIONS: Yes: caution with weightbearing for the next 4-6 weeks, recommended less than 5lbs for next 2 weeks with affected hand  PAIN:  Are you having pain? Yes: NPRS scale: between mild and moderate now at rest  Pain location:  sx area Pain description: aching and sore Aggravating factors: gripping/squeezing Relieving factors:  rest  FALLS: Has patient fallen in last 6 months? No, not a fall risk  PLOF: Independent with I/ADLs  PATIENT GOALS: To improve motion, function with affected surgical hand  NEXT MD VISIT: PRN    OBJECTIVE MEASURES:   ADLs: Overall ADLs: States decreased ability to grab, hold household objects, pain and difficulty to open containers, perform FMS tasks (manipulate fasteners on clothing).     UPPER EXTREMITY ROM:     A/ROM Right eval  Wrist flexion 69  Wrist extension 71  (Blank rows = not tested)                    Hand A/ROM Right eval  Full Fist Ability (or Gap to Distal Palmar Crease) Unable due to stiffness/soreness  Thumb Opposition  (Kapandji Scale)  5/10  Thumb MCP (0-60) 0- 48  Thumb IP (0-80) (+20) - 46  (Blank rows = not tested)   HAND STRENGTH & FUNCTION: Eval: Observed weakness in affected hand/arm, grossly 3-/5 MMT, but specific gripping and resistance training contraindicated today. Also at least mild observed coordination impairments with affected hand/arm due to stiffness and soreness. These deficits are expected to improve with HEP and recommendations.    COORDINATION: Eval: Mild observed coordination impairments with surgical hand, as seen by pain,stiffness, etc. Expected to improve with HEP and recommendations.   SENSATION: Eval:  Light touch mildly diminished especially through sx area. Expected to improve with HEP and recommendations.   EDEMA:   Eval:  Mildly swollen in surgical hand today.  Expected to improve  with HEP and recommendations.   COGNITION: Eval: Overall cognitive status: WFL for evaluation today   OBSERVATIONS:   Eval: Surgical site is clean and no overt signs of infection, no drainage, signs of dehiscence, etc.  Tenderness and swelling is within normal limits for post-op timeframe.     TODAY'S TREATMENT:  Post-evaluation treatment:   The patient was given safety information for managing post-op wound, including not to soak  wound, to keep clean and dry, to start with gentle desensitization now and scar mobilizations in approx 3 days or the wound is closed. The patient should replace their wound dressing at least 1x daily, and monitor for signs of infection.  The patient should contact the surgeon with any concerns immediately.   The patient should also avoid any strong gripping, push, pull, weight bearing or repetitive motion for the next month.  The patient  should not be doing painful activities.   After a month, the patient can progressively return to all light, normal activities. Sports and heavy weight lifting should be withheld for a total of 3 months.   The patient was also educated (explanation and demonstration) on the following home exercise program including tolerable range of motion, gentle passive range of motion, scar care, progressive desensitization, prevention of soft tissue contractures, etc. The patient states understanding all directions and feels comfortable with doing this at home, self-management, and following up with the surgeon as needed/scheduled.    Trigger Release THUMB Exercises      - Bend and Pull Back Wrist SLOWLY  - 4 x daily - 10-15 reps - Tendon Glides  - 4-5 x daily - 5 reps - 3 second hold - Thumb Opposition  - 4-5 x daily - 10 reps - Thumb AROM IP Blocking  - 4-5 x daily - 10 reps - Thumb stretch  - 3-4 x daily - 3 reps - 15 hold - Wrist Prayer Stretch  - 3-4 x daily - 3 reps - 15 seconds hold Patient Education - Scar Massage    PATIENT EDUCATION: Education details: See tx section above for details  Person educated: Patient Education method: Verbal Instruction, Teach back, Handouts  Education comprehension: States and demonstrates understanding   HOME EXERCISE PROGRAM: See tx section above for details    GOALS: Goals reviewed with patient? Yes   SHORT TERM GOALS: (STG required if POC>30 days) Target Date: 07/23/24  1.  Pt will demo/state understanding of initial  HEP and therapist recommendations to improve pain levels, improve motions and ability and eventually return to normal activities.   Goal status: MET    ASSESSMENT:  CLINICAL IMPRESSION: Patient is a 43 y.o. female who was seen today for occupational therapy evaluation for swelling, pain, weakness and decreased functional ability following trigger finger release procedure. The patient is appropriate for OT rehab services and benefited from treatment today. The patient got copious education/treatment today for self-care, wound management, exercises and how to transition to normal activities in the next 4-6 weeks. The patient agrees that they can manage these recommendations independently, and should not need to return for follow up visits. The patient should follow up with the surgeon with any concerns, and could possibly return to therapy, if needed, with a new order.  The patient will discharge therapy treatment after this visit.     PERFORMANCE DEFICITS: in functional skills including ADLs, IADLs, coordination, dexterity, sensation, edema, ROM, strength, pain, fascial restrictions, flexibility, Fine motor control, body mechanics, endurance, decreased knowledge of precautions, wound, and  UE functional use, cognitive skills including problem solving and safety awareness, and psychosocial skills including coping strategies, environmental adaptation, and habits.   IMPAIRMENTS: are limiting patient from ADLs, IADLs, rest and sleep, leisure, and social participation.   COMORBIDITIES: may have co-morbidities  that affects occupational performance. Patient will benefit from skilled OT to address above impairments and improve overall function.  MODIFICATION OR ASSISTANCE TO COMPLETE EVALUATION: No modification of tasks or assist necessary to complete an evaluation.  OT OCCUPATIONAL PROFILE AND HISTORY: Problem focused assessment: Including review of records relating to presenting problem.  CLINICAL  DECISION MAKING: LOW - limited treatment options, no task modification necessary  REHAB POTENTIAL: Excellent  EVALUATION COMPLEXITY: Low      PLAN:  OT FREQUENCY: one time visit  OT DURATION: 1 sessions  PLANNED INTERVENTIONS: self care/ADL training, therapeutic exercise, therapeutic activity, neuromuscular re-education, manual therapy, scar mobilization, passive range of motion, splinting, ultrasound, fluidotherapy, compression bandaging, moist heat, cryotherapy, contrast bath, patient/family education, energy conservation, coping strategies training, and Re-evaluation  RECOMMENDED OTHER SERVICES: none now   CONSULTED AND AGREED WITH PLAN OF CARE: Patient  PLAN FOR NEXT SESSION:   N/A    Melvenia Ada, OTR/L, CHT 07/23/2024, 10:34 AM

## 2024-07-22 NOTE — Progress Notes (Unsigned)
   Cassandra Levy - 43 y.o. female MRN [id number]  Date of birth: Nov 18, 1981  Office Visit Note: Visit Date: 07/22/2024 PCP: Watt Harlene BROCKS, MD Referred by: Watt Harlene BROCKS, MD  Subjective:  HPI: Cassandra Levy is a 43 y.o. female who presents today for follow up 2 week status post right thumb trigger release.  Pertinent ROS were reviewed with the patient and found to be negative unless otherwise specified above in HPI.   Assessment & Plan: Visit Diagnoses: No diagnosis found.  Plan: ***  Follow-up: No follow-ups on file.   Meds & Orders: No orders of the defined types were placed in this encounter.  No orders of the defined types were placed in this encounter.    Procedures: No procedures performed       Objective:   Vital Signs: There were no vitals taken for this visit.  Ortho Exam ***  Imaging: No results found.   Anshul Afton Alderton, M.D. [facility], Hand Surgery

## 2024-07-23 ENCOUNTER — Encounter: Payer: Self-pay | Admitting: Rehabilitative and Restorative Service Providers"

## 2024-07-23 ENCOUNTER — Ambulatory Visit: Admitting: Rehabilitative and Restorative Service Providers"

## 2024-07-23 DIAGNOSIS — R278 Other lack of coordination: Secondary | ICD-10-CM | POA: Diagnosis not present

## 2024-07-23 DIAGNOSIS — M6281 Muscle weakness (generalized): Secondary | ICD-10-CM

## 2024-07-23 DIAGNOSIS — M79641 Pain in right hand: Secondary | ICD-10-CM

## 2024-07-23 DIAGNOSIS — R600 Localized edema: Secondary | ICD-10-CM

## 2024-08-23 ENCOUNTER — Ambulatory Visit (INDEPENDENT_AMBULATORY_CARE_PROVIDER_SITE_OTHER): Admitting: Orthopedic Surgery

## 2024-08-23 DIAGNOSIS — Z9889 Other specified postprocedural states: Secondary | ICD-10-CM

## 2024-08-23 NOTE — Progress Notes (Signed)
   Cassandra Levy - 43 y.o. female MRN [id number]  Date of birth: 1980/12/18  Office Visit Note: Visit Date: 08/23/2024 PCP: Watt Harlene BROCKS, MD Referred by: Watt Harlene BROCKS, MD  Subjective:  HPI: Cassandra Levy is a 43 y.o. female who presents today for follow up 6 weeks status post post right thumb trigger release . Having ongoing soreness, recent trauma to the hand with swelling, no residual clicking and locking.    Pertinent ROS were reviewed with the patient and found to be negative unless otherwise specified above in HPI.   Assessment & Plan: Visit Diagnoses:  1. S/P trigger finger release     Plan: Continue with ROM exercises for the hand.  OT ongoing. Follow up 6 weeks.   Follow-up: No follow-ups on file.   Meds & Orders: No orders of the defined types were placed in this encounter.  No orders of the defined types were placed in this encounter.    Procedures: No procedures performed       Objective:   Vital Signs: There were no vitals taken for this visit.  Ortho Exam Right thumb: - Well-healed incision at the base of the thumb, skin is well-approximated, no erythema or drainage - Able to perform digital range of motion without residual clicking or locking - Sensation intact distally, hand is warm well-perfused   Imaging: No results found.   Boris Engelmann Afton Alderton, M.D. [facility], Hand Surgery

## 2024-09-04 ENCOUNTER — Other Ambulatory Visit: Payer: Self-pay | Admitting: Family Medicine

## 2024-09-04 DIAGNOSIS — E039 Hypothyroidism, unspecified: Secondary | ICD-10-CM

## 2024-09-15 ENCOUNTER — Ambulatory Visit: Admitting: Family Medicine

## 2024-09-15 ENCOUNTER — Encounter: Payer: Self-pay | Admitting: Family Medicine

## 2024-09-15 VITALS — BP 134/82 | HR 83 | Temp 98.4°F | Ht 68.5 in | Wt 386.0 lb

## 2024-09-15 DIAGNOSIS — E0590 Thyrotoxicosis, unspecified without thyrotoxic crisis or storm: Secondary | ICD-10-CM | POA: Diagnosis not present

## 2024-09-15 DIAGNOSIS — R0981 Nasal congestion: Secondary | ICD-10-CM | POA: Diagnosis not present

## 2024-09-15 DIAGNOSIS — R051 Acute cough: Secondary | ICD-10-CM | POA: Diagnosis not present

## 2024-09-15 DIAGNOSIS — Z13220 Encounter for screening for lipoid disorders: Secondary | ICD-10-CM

## 2024-09-15 DIAGNOSIS — Z131 Encounter for screening for diabetes mellitus: Secondary | ICD-10-CM | POA: Diagnosis not present

## 2024-09-15 LAB — LIPID PANEL
Cholesterol: 196 mg/dL (ref 0–200)
HDL: 43.5 mg/dL (ref >39.00)
LDL Cholesterol: 140 mg/dL — ABNORMAL HIGH (ref 0–99)
NonHDL: 152.82
Total CHOL/HDL Ratio: 5
Triglycerides: 63 mg/dL (ref 0.0–149.0)
VLDL: 12.6 mg/dL (ref 0.0–40.0)

## 2024-09-15 LAB — HEMOGLOBIN A1C: Hgb A1c MFr Bld: 5.7 % (ref 4.6–6.5)

## 2024-09-15 LAB — POCT RAPID STREP A (OFFICE): Rapid Strep A Screen: NEGATIVE

## 2024-09-15 LAB — TSH: TSH: 4.74 u[IU]/mL (ref 0.35–5.50)

## 2024-09-15 LAB — POC COVID19 BINAXNOW: SARS Coronavirus 2 Ag: NEGATIVE

## 2024-09-15 MED ORDER — DOXYCYCLINE HYCLATE 100 MG PO CAPS: 100.0000 mg | ORAL_CAPSULE | Freq: Two times a day (BID) | ORAL | 0 refills | Status: DC

## 2024-09-15 NOTE — Patient Instructions (Signed)
 Good to see you today- I hope you feel better soon Doxycycline  rx for sinus infection

## 2024-09-15 NOTE — Progress Notes (Addendum)
 [employer] [facility] [employer] [address], [address] [place], [place] [address] [phone]  Date:  09/15/2024   Name:  Cassandra Levy   DOB:  24-May-1981   MRN:  [id number]  PCP:  Watt Harlene BROCKS, MD    Chief Complaint: Sinus Problem (Onset 09/10/2024 Lucillie pressure around nose, my ears hurt and are itchy. Both sides of my face feels clogged )   History of Present Illness:  Analisa Sledd Raczkowski is a 43 y.o. very pleasant female patient who presents with the following:  Pt seen today with concern of acute illness Last seen by me in May for a thumb injury Discussed the use of AI scribe software for clinical note transcription with the patient, who gave verbal consent to proceed.  History of Present Illness Zurii Hewes Raju is a 43 year old female who presents with sinus congestion, facial pain, and sore throat.  She has been experiencing sinus congestion and pressure since Friday, which progressed over the weekend to include left-sided facial pain and a sore throat described as 'on fire'. She feels very congested and has noted a low-grade fever, with her temperature reaching 98.39F, which is significant for her as she typically has a lower baseline temperature. She has a cough but no vomiting or diarrhea. The throat pain is particularly bothersome due to her work requiring frequent speaking.  She has taken her regular allergy  medication and has used ibuprofen  or Tylenol  as needed for symptom relief. She has used ibuprofen  or Tylenol  as needed for symptom relief. Her mother-in-law recently returned home with a sinus infection, and her husband is experiencing an asthma flare-up, which is typical for him during this time of year. She notes that her allergies tend to worsen during seasonal transitions, particularly from winter to spring and summer to fall.  She has a history of polycystic ovary syndrome (PCOS) and has experienced miscarriages. She  is actively trying to conceive and has been working with her OB-GYN. She has been feeling more tired than usual, which she associates with the onset of illness.    Patient Active Problem List   Diagnosis Date Noted   Trigger finger of right thumb 05/14/2024   Degenerative tear of lateral meniscus of left knee 09/11/2022   Synovitis of left shoulder 08/07/2022   Morbid obesity (HCC) 10/03/2015   Hyperthyroidism 01/20/2014   PCOS (polycystic ovarian syndrome) 12/13/2013   Lower extremity edema 12/03/2013   Obesity 10/08/2012   Goiter 06/27/2012   Constipation 06/27/2012    Past Medical History:  Diagnosis Date   Allergy     Anemia    Thyroid  disease     Past Surgical History:  Procedure Laterality Date   CHOLECYSTECTOMY     COLPOSCOPY      Social History   Tobacco Use   Smoking status: Never   Smokeless tobacco: Never  Vaping Use   Vaping status: Never Used  Substance Use Topics   Alcohol use: No   Drug use: No    Family History  Problem Relation Age of Onset   Stroke Mother    Leukemia Other     Allergies  Allergen Reactions   Amoxicillin Swelling    Tightness in throat   Bee Venom    Cleocin  [Clindamycin  Hcl]     hives   Fluzone [Influenza Vac Split Quad]     Swelling and redness of arm     Medication list has been reviewed and  updated.  Current Outpatient Medications on File Prior to Visit  Medication Sig Dispense Refill   Ascorbic Acid (VITAMIN C PO) Take by mouth.     Cholecalciferol (VITAMIN D3) 1.25 MG (50000 UT) CAPS Take 1 weekly for 12 weeks 12 capsule 0   clotrimazole  (CLOTRIMAZOLE  AF) 1 % cream Apply 1 application topically 2 (two) times daily. 60 g 0   EPINEPHrine  (EPIPEN ) 0.3 mg/0.3 mL SOAJ injection Inject 0.3 mLs (0.3 mg total) into the muscle once. 2 Device 2   fluticasone  (FLONASE ) 50 MCG/ACT nasal spray Place 2 sprays into both nostrils daily. 16 g 6   hydrochlorothiazide  (HYDRODIURIL ) 25 MG tablet TAKE 1-1.5 TABLETS (25-37.5 MG  TOTAL) BY MOUTH DAILY. 45 tablet 2   levothyroxine  (SYNTHROID ) 150 MCG tablet TAKE 1 TABLET BY MOUTH EVERY DAY 30 tablet 3   metFORMIN (GLUCOPHAGE-XR) 750 MG 24 hr tablet Take 750 mg by mouth daily.     norethindrone (AYGESTIN) 5 MG tablet Take 10 mg by mouth daily.      Prenatal Vit-Fe Fumarate-FA (PRENATAL MULTIVITAMIN) TABS tablet Take 1 tablet by mouth daily at 12 noon.     terbinafine  (LAMISIL ) 250 MG tablet Take 1 tablet (250 mg total) by mouth daily. (Patient not taking: Reported on 09/15/2024) 14 tablet 0   No current facility-administered medications on file prior to visit.    Review of Systems:  As per HPI- otherwise negative.   Physical Examination: Vitals:   09/15/24 0930  BP: 134/82  Pulse: 83  Temp: 98.4 F (36.9 C)  SpO2: 96%   Vitals:   09/15/24 0930  Weight: (!) 386 lb (175.1 kg)  Height: 5' 8.5 (1.74 m)   Body mass index is 57.84 kg/m. Ideal Body Weight: Weight in (lb) to have BMI = 25: 166.5  GEN: no acute distress.  Obese, looks well  HEENT: Atraumatic, Normocephalic.  Bilateral TM wnl, oropharynx normal.  PEERL,EOMI.  Left frontal sinuses are tender to percussion  Ears and Nose: No external deformity. CV: RRR, No M/G/R. No JVD. No thrill. No extra heart sounds. PULM: CTA B, no wheezes, crackles, rhonchi. No retractions. No resp. distress. No accessory muscle use. EXTR: No c/c/e PSYCH: Normally interactive. Conversant.    Assessment and Plan: Sinus congestion - Plan: POC COVID-19 BinaxNow, doxycycline  (VIBRAMYCIN ) 100 MG capsule  Hyperthyroidism - Plan: TSH  Screening, lipid - Plan: Lipid panel  Screening for diabetes mellitus - Plan: Hemoglobin A1c  Assessment & Plan Acute sinusitis Acute sinusitis with sinus congestion, left-sided facial pain, and severe sore throat. Negative COVID-19 and strep tests. Symptoms likely exacerbated by seasonal allergies. - Prescribed doxycycline  for sinus infection.  Patient has penicillin allergy   Female  infertility associated with anovulation and polycystic ovary syndrome (PCOS) Female infertility due to anovulation and PCOS. History of miscarriages. Partner's sperm count improved with vitamins. Aware of challenges of conceiving naturally over age 80 and with PCOS. - Encouraged consultation with a fertility specialist. - Discussed potential use of IVF with OB GYN.  Signed Harlene Schroeder, MD  Addendum 10/2, received labs as below.  Message to patient  Results for orders placed or performed in visit on 09/15/24  POC COVID-19 BinaxNow   Collection Time: 09/15/24  9:45 AM  Result Value Ref Range   SARS Coronavirus 2 Ag Negative Negative  TSH   Collection Time: 09/15/24 10:02 AM  Result Value Ref Range   TSH 4.74 0.35 - 5.50 uIU/mL  Hemoglobin A1c   Collection Time: 09/15/24 10:02 AM  Result Value  Ref Range   Hgb A1c MFr Bld 5.7 4.6 - 6.5 %  Lipid panel   Collection Time: 09/15/24 10:02 AM  Result Value Ref Range   Cholesterol 196 0 - 200 mg/dL   Triglycerides 36.9 0.0 - 149.0 mg/dL   HDL 56.49 >60.99 mg/dL   VLDL 87.3 0.0 - 59.9 mg/dL   LDL Cholesterol 859 (H) 0 - 99 mg/dL   Total CHOL/HDL Ratio 5    NonHDL 152.82   POCT rapid strep A   Collection Time: 09/15/24 11:05 AM  Result Value Ref Range   Rapid Strep A Screen Negative Negative    "

## 2024-09-16 ENCOUNTER — Encounter: Payer: Self-pay | Admitting: Family Medicine

## 2024-10-04 ENCOUNTER — Ambulatory Visit: Admitting: Orthopedic Surgery

## 2024-10-04 DIAGNOSIS — Z9889 Other specified postprocedural states: Secondary | ICD-10-CM

## 2024-10-04 NOTE — Progress Notes (Signed)
   Cassandra Levy - 43 y.o. female MRN [id number]  Date of birth: 01/24/1981  Office Visit Note: Visit Date: 10/04/2024 PCP: Watt Harlene BROCKS, MD Referred by: Watt Harlene BROCKS, MD  Subjective:  HPI: Cassandra Levy is a 43 y.o. female who presents today for follow up 12 weeks status post right thumb trigger digit release.  She is doing well overall and is progressing nicely with range of motion.  Strength is slowly improving as well.  Pertinent ROS were reviewed with the patient and found to be negative unless otherwise specified above in HPI.   Assessment & Plan: Visit Diagnoses:  1. S/P trigger finger release     Plan: She is doing quite well postoperatively and is pleased with her overall progress.  I did explain that the strength is usually the slowest thing to progress.  On examination today, her thumb pinch strength on the right side is 10 compared to 20 on the left.  We can continue with a 10 pound weight restriction for the time being with advancement as tolerated over activities.  I will plan on seeing her back in approximate 6 to 8 weeks for a final check.  She is demonstrating appropriate range of motion on examination today without residual clicking or locking.  Follow-up: No follow-ups on file.   Meds & Orders: No orders of the defined types were placed in this encounter.  No orders of the defined types were placed in this encounter.    Procedures: No procedures performed       Objective:   Vital Signs: LMP 08/23/2024 (Exact Date)   Ortho Exam Left hand: - Well-healing incision at the base of the thumb, skin is well-approximated, no erythema or drainage - Able to perform full digital range of motion without residual clicking or locking, some opposition to the small finger [facility] - Sensation intact distally, hand is warm well-perfused   Imaging: No results found.   Donavan Kerlin Afton Alderton, M.D. [facility], Hand Surgery

## 2024-10-07 ENCOUNTER — Other Ambulatory Visit: Payer: Self-pay | Admitting: Family Medicine

## 2024-10-07 DIAGNOSIS — R600 Localized edema: Secondary | ICD-10-CM

## 2024-10-18 ENCOUNTER — Encounter: Payer: Self-pay | Admitting: Radiology

## 2024-11-15 ENCOUNTER — Ambulatory Visit: Admitting: Orthopedic Surgery

## 2024-11-15 ENCOUNTER — Telehealth: Payer: Self-pay | Admitting: Orthopedic Surgery

## 2024-11-15 NOTE — Telephone Encounter (Signed)
 Patient called and said she needs a note to return back to work with the 10 pound lifting restrictions only. [phone]

## 2024-11-16 NOTE — Telephone Encounter (Signed)
 Called and spoke with patient Rescheduled appointment to an earlier date

## 2024-11-28 NOTE — Progress Notes (Unsigned)
° °  Cassandra Levy - 43 y.o. female MRN [id number]  Date of birth: 01-28-1981  Office Visit Note: Visit Date: 11/29/2024 PCP: Watt Harlene BROCKS, MD Referred by: Watt Harlene BROCKS, MD  Subjective:  HPI: Cassandra Levy is a 43 y.o. female who presents today for follow up 71-month status post right thumb trigger release.  Pertinent ROS were reviewed with the patient and found to be negative unless otherwise specified above in HPI.   Assessment & Plan: Visit Diagnoses:  1. S/P trigger finger release     Plan: Doing well overall.  Can resume activities as tolerated without restriction at this time.  Work note provided today.  Follow-up: No follow-ups on file.   Meds & Orders: No orders of the defined types were placed in this encounter.  No orders of the defined types were placed in this encounter.    Procedures: No procedures performed       Objective:   Vital Signs: There were no vitals taken for this visit.  Ortho Exam Right thumb with well-healed incisional site, able to perform appropriate range of motion at the IP and MP joint, thumb opposition to the small finger DPC, IP flexion extension without observable clicking or locking  Imaging: No results found.   Caress Reffitt Afton Alderton, M.D. [facility], Hand Surgery

## 2024-11-29 ENCOUNTER — Ambulatory Visit: Admitting: Orthopedic Surgery

## 2024-11-29 DIAGNOSIS — Z9889 Other specified postprocedural states: Secondary | ICD-10-CM

## 2024-12-06 ENCOUNTER — Ambulatory Visit: Admitting: Family Medicine

## 2024-12-06 VITALS — BP 142/86 | HR 78 | Temp 98.2°F | Ht 68.5 in | Wt 390.2 lb

## 2024-12-06 DIAGNOSIS — R051 Acute cough: Secondary | ICD-10-CM

## 2024-12-06 MED ORDER — BENZONATATE 200 MG PO CAPS: 200.0000 mg | ORAL_CAPSULE | Freq: Two times a day (BID) | ORAL | 1 refills | Status: AC | PRN

## 2024-12-06 MED ORDER — DOXYCYCLINE HYCLATE 100 MG PO CAPS: 100.0000 mg | ORAL_CAPSULE | Freq: Two times a day (BID) | ORAL | 0 refills | Status: DC

## 2024-12-06 NOTE — Progress Notes (Signed)
 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 very pleasant female patient who presents with the following:  Pt seen today with concern of illness Last seen by me in October History of PCOS, obesity, hypothyroidism I did see her back in October with bronchitis and gave her a course of doxycycline   Metformin Hydrochlorothiazide  Levothyroxine   Lab Results  Component Value Date   HGBA1C 5.7 09/15/2024   Lab Results  Component Value Date   TSH 4.74 09/15/2024   Discussed the use of AI scribe software for clinical note transcription with the patient, who gave verbal consent to proceed.  History of Present Illness Cassandra Levy is a 43 year old female who presents with persistent upper respiratory symptoms and loss of voice.  She began feeling unwell on Wednesday, with symptoms persisting through the weekend. She visited an urgent care on Saturday where she was tested for flu, COVID, and strep, all of which were negative. Her primary symptoms include significant facial congestion, a sensation of weight, and a mostly lost voice. She describes a peculiar symptom of tasting smoke when she coughs, which she associates with attending a concert in late November where there was a lot of smoke exposure.  She has been managing her symptoms with Mucinex , Advil  for body aches and fever, and her regular Claravis. Her fever broke this morning, and she has not taken any Advil  or ibuprofen  today. Her mucus has turned  yellowish, and she is not coughing up mucus but is blowing it out.  Her past medical history includes a previous prescription of doxycycline , which she recalls raised her resting heart rate as monitored by her Fitbit. She has a history of not tolerating hemax or penicillins, with amoxicillin previously being effective for her.  She mentions that her workplace and her husband's workplace have been affected by similar illnesses, with one colleague developing pneumonia. She attended a concert at the end of November where there was significant smoke exposure. She does not smoke.  No difficulty breathing, wheezing, or burning sensation when breathing. She is not coughing up mucus but is blowing it out. She notes a peculiar taste of smoke when coughing.    Patient Active Problem List   Diagnosis Date Noted   Trigger finger of right thumb 05/14/2024   Degenerative tear of lateral meniscus of left knee 09/11/2022   Synovitis of left shoulder 08/07/2022   Morbid obesity (HCC) 10/03/2015   Hyperthyroidism 01/20/2014   PCOS (polycystic ovarian syndrome) 12/13/2013   Lower extremity edema 12/03/2013   Obesity 10/08/2012   Goiter 06/27/2012   Constipation 06/27/2012    Past Medical History:  Diagnosis Date   Allergy     Anemia    Thyroid  disease     Past Surgical History:  Procedure Laterality Date   CHOLECYSTECTOMY     COLPOSCOPY      Social History[1]  Family History  Problem Relation Age of Onset   Stroke Mother  Leukemia Other     Allergies[2]  Medication list has been reviewed and updated.  Medications Ordered Prior to Encounter[3]  Review of Systems:  As per HPI- otherwise negative.   Physical Examination: Vitals:   12/06/24 1504  BP: (!) 142/86  Pulse: 78  Temp: 98.2 F (36.8 C)  SpO2: 99%   Vitals:   12/06/24 1504  Weight: (!) 390 lb 3.2 oz (177 kg)  Height: 5' 8.5 (1.74 m)   Body mass index is 58.47 kg/m. Ideal Body Weight: Weight in (lb) to have  BMI = 25: 166.5  GEN: no acute distress.  Obese, looks well  HEENT: Atraumatic, Normocephalic. Bilateral TM wnl, oropharynx normal.  PEERL,EOMI.   Ears and Nose: No external deformity. CV: RRR, No M/G/R. No JVD. No thrill. No extra heart sounds. PULM: CTA B, no wheezes, crackles, rhonchi. No retractions. No resp. distress. No accessory muscle use. EXTR: No c/c/e PSYCH: Normally interactive. Conversant.   Assessment and Plan: Acute cough - Plan: doxycycline  (VIBRAMYCIN ) 100 MG capsule, benzonatate  (TESSALON ) 200 MG capsule  Assessment & Plan Acute sinusitis Symptoms include congestion, facial pressure, and loss of voice. Negative for flu, COVID, and strep. Previous doxycycline  increased heart rate. Z-Pak considered but doxycycline  preferred. Recent fever and body aches managed with Advil . Recent smoke exposure may contribute. - Prescribed doxycycline  twice daily for ten days. - Consider Tessalon  Perles for cough if covered by insurance. - Continue Mucinex  for congestion. - Use Afrin nasal spray as needed for nasal congestion. - Take ibuprofen  or Tylenol  for body aches. - Rest and maintain adequate fluid intake. - Provided work note for today and tomorrow.  Signed Harlene Schroeder, MD     [1]  Social History Tobacco Use   Smoking status: Never   Smokeless tobacco: Never  Vaping Use   Vaping status: Never Used  Substance Use Topics   Alcohol use: No   Drug use: No  [2]  Allergies Allergen Reactions   Amoxicillin Swelling    Tightness in throat   Bee Venom    Cleocin  [Clindamycin  Hcl]     hives   Fluzone [Influenza Vac Split Quad]     Swelling and redness of arm   [3]  Current Outpatient Medications on File Prior to Visit  Medication Sig Dispense Refill   Ascorbic Acid (VITAMIN C PO) Take by mouth.     Cholecalciferol (VITAMIN D3) 1.25 MG (50000 UT) CAPS Take 1 weekly for 12 weeks 12 capsule 0   clotrimazole  (CLOTRIMAZOLE  AF) 1 % cream Apply 1 application topically 2  (two) times daily. 60 g 0   EPINEPHrine  (EPIPEN ) 0.3 mg/0.3 mL SOAJ injection Inject 0.3 mLs (0.3 mg total) into the muscle once. 2 Device 2   fluticasone  (FLONASE ) 50 MCG/ACT nasal spray Place 2 sprays into both nostrils daily. 16 g 6   hydrochlorothiazide  (HYDRODIURIL ) 25 MG tablet Take 1-1.5 tablets (25-37.5 mg total) by mouth daily. 135 tablet 1   levothyroxine  (SYNTHROID ) 150 MCG tablet TAKE 1 TABLET BY MOUTH EVERY DAY 30 tablet 3   metFORMIN (GLUCOPHAGE-XR) 750 MG 24 hr tablet Take 750 mg by mouth daily.     norethindrone (AYGESTIN) 5 MG tablet Take 10 mg by mouth daily.      Prenatal Vit-Fe Fumarate-FA (PRENATAL MULTIVITAMIN) TABS tablet Take 1 tablet by mouth daily at 12 noon.     terbinafine  (LAMISIL ) 250 MG tablet Take 1 tablet (250 mg total) by mouth daily. 14 tablet 0   No current facility-administered medications on file  prior to visit.   "

## 2024-12-13 ENCOUNTER — Ambulatory Visit: Admitting: Orthopedic Surgery

## 2024-12-28 ENCOUNTER — Other Ambulatory Visit: Payer: Self-pay | Admitting: Family Medicine

## 2024-12-28 DIAGNOSIS — E039 Hypothyroidism, unspecified: Secondary | ICD-10-CM

## 2024-12-29 ENCOUNTER — Encounter: Payer: Self-pay | Admitting: Family Medicine

## 2024-12-29 MED ORDER — TIZANIDINE HCL 2 MG PO TABS: 2.0000 mg | ORAL_TABLET | Freq: Four times a day (QID) | ORAL | 0 refills | Status: AC | PRN

## 2024-12-29 NOTE — Addendum Note (Signed)
 Addended by: WATT RAISIN C on: 12/29/2024 02:14 PM   Modules accepted: Orders

## 2025-01-01 NOTE — Patient Instructions (Signed)
 It was good to see you today, I will be in touch with your labs

## 2025-01-01 NOTE — Progress Notes (Unsigned)
 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   History of Present Illness:  Cassandra Levy is a 44 y.o. very pleasant female patient who presents with the following:  Patient seen today for physical exam.  I saw her most recently just before Christmas with a cough History of PCOS, obesity, hypothyroidism   Mammogram Flu shot Pap smear 2023 at [facility] We can update routine labs today  HCTZ 25 Levothyroxine  Metformin? Norethindrone 10 mg daily 10 days/month  Eczema: Patient complains of rash. Onset of symptoms {time - days/wks/months:33135} ago, and have been {clinical course:17::unchanged} since that time.  Risk factors include ***. Treatment modalities that have been used in the past include: ***   Patient Active Problem List   Diagnosis Date Noted   Trigger finger of right thumb 05/14/2024   Degenerative tear of lateral meniscus of left knee 09/11/2022   Synovitis of left shoulder 08/07/2022   Morbid obesity (HCC) 10/03/2015   Hyperthyroidism 01/20/2014   PCOS (polycystic ovarian syndrome) 12/13/2013   Lower extremity edema 12/03/2013   Obesity 10/08/2012   Goiter 06/27/2012   Constipation 06/27/2012    Past Medical History:  Diagnosis Date   Allergy     Anemia    Thyroid  disease     Past Surgical History:  Procedure Laterality Date   CHOLECYSTECTOMY     COLPOSCOPY      Social History[1]  Family History  Problem Relation Age of Onset   Stroke Mother    Leukemia Other     Allergies[2]  Medication list has been reviewed and updated.  Medications Ordered Prior to Encounter[3]  Review of Systems:  As per HPI- otherwise negative.   Physical Examination: There were no vitals filed for this  visit. There were no vitals filed for this visit. There is no height or weight on file to calculate BMI. Ideal Body Weight:    GEN: no acute distress. HEENT: Atraumatic, Normocephalic.  Ears and Nose: No external deformity. CV: RRR, No M/G/R. No JVD. No thrill. No extra heart sounds. PULM: CTA B, no wheezes, crackles, rhonchi. No retractions. No resp. distress. No accessory muscle use. ABD: S, NT, ND, +BS. No rebound. No HSM. EXTR: No c/c/e PSYCH: Normally interactive. Conversant.    Assessment and Plan: No diagnosis found.  Assessment & Plan   Signed Harlene Watt, MD    [1]  Social History Tobacco Use   Smoking status: Never   Smokeless tobacco: Never  Vaping Use   Vaping status: Never Used  Substance Use Topics   Alcohol use: No   Drug use: No  [2]  Allergies Allergen Reactions   Amoxicillin Swelling    Tightness in throat   Bee Venom    Cleocin  [Clindamycin  Hcl]     hives   Fluzone [Influenza Vac Split Quad]     Swelling and redness of arm   [3]  Current Outpatient Medications on File Prior to Visit  Medication Sig Dispense Refill   Ascorbic Acid (VITAMIN C PO) Take by mouth.     benzonatate  (TESSALON ) 200 MG capsule Take 1 capsule (200 mg total) by mouth 2 (two) times daily as needed for cough. 60 capsule 1   Cholecalciferol (  VITAMIN D3) 1.25 MG (50000 UT) CAPS Take 1 weekly for 12 weeks 12 capsule 0   clotrimazole  (CLOTRIMAZOLE  AF) 1 % cream Apply 1 application topically 2 (two) times daily. 60 g 0   doxycycline  (VIBRAMYCIN ) 100 MG capsule Take 1 capsule (100 mg total) by mouth 2 (two) times daily. 20 capsule 0   EPINEPHrine  (EPIPEN ) 0.3 mg/0.3 mL SOAJ injection Inject 0.3 mLs (0.3 mg total) into the muscle once. 2 Device 2   fluticasone  (FLONASE ) 50 MCG/ACT nasal spray Place 2 sprays into both nostrils daily. 16 g 6   hydrochlorothiazide  (HYDRODIURIL ) 25 MG tablet Take 1-1.5 tablets (25-37.5 mg total) by mouth daily. 135 tablet 1   levothyroxine   (SYNTHROID ) 150 MCG tablet Take 1 tablet (150 mcg total) by mouth daily before breakfast. 90 tablet 0   metFORMIN (GLUCOPHAGE-XR) 750 MG 24 hr tablet Take 750 mg by mouth daily.     norethindrone (AYGESTIN) 5 MG tablet Take 10 mg by mouth daily.      Prenatal Vit-Fe Fumarate-FA (PRENATAL MULTIVITAMIN) TABS tablet Take 1 tablet by mouth daily at 12 noon.     terbinafine  (LAMISIL ) 250 MG tablet Take 1 tablet (250 mg total) by mouth daily. 14 tablet 0   tiZANidine  (ZANAFLEX ) 2 MG tablet Take 1 tablet (2 mg total) by mouth every 6 (six) hours as needed for muscle spasms. 20 tablet 0   No current facility-administered medications on file prior to visit.   "

## 2025-01-03 ENCOUNTER — Encounter: Payer: Self-pay | Admitting: Family Medicine

## 2025-01-03 ENCOUNTER — Ambulatory Visit: Payer: 59 | Admitting: Family Medicine

## 2025-01-03 VITALS — BP 136/84 | HR 84 | Temp 97.8°F | Ht 68.5 in | Wt 390.4 lb

## 2025-01-03 DIAGNOSIS — Z131 Encounter for screening for diabetes mellitus: Secondary | ICD-10-CM

## 2025-01-03 DIAGNOSIS — Z8261 Family history of arthritis: Secondary | ICD-10-CM

## 2025-01-03 DIAGNOSIS — B356 Tinea cruris: Secondary | ICD-10-CM

## 2025-01-03 DIAGNOSIS — L719 Rosacea, unspecified: Secondary | ICD-10-CM

## 2025-01-03 DIAGNOSIS — Z1329 Encounter for screening for other suspected endocrine disorder: Secondary | ICD-10-CM

## 2025-01-03 DIAGNOSIS — Z13220 Encounter for screening for lipoid disorders: Secondary | ICD-10-CM | POA: Diagnosis not present

## 2025-01-03 DIAGNOSIS — R21 Rash and other nonspecific skin eruption: Secondary | ICD-10-CM

## 2025-01-03 DIAGNOSIS — Z130 Encounter for screening for diseases of the blood and blood-forming organs and certain disorders involving the immune mechanism: Secondary | ICD-10-CM

## 2025-01-03 DIAGNOSIS — Z0000 Encounter for general adult medical examination without abnormal findings: Secondary | ICD-10-CM | POA: Diagnosis not present

## 2025-01-03 DIAGNOSIS — R600 Localized edema: Secondary | ICD-10-CM | POA: Diagnosis not present

## 2025-01-03 MED ORDER — TERBINAFINE HCL 250 MG PO TABS: 250.0000 mg | ORAL_TABLET | Freq: Every day | ORAL | 0 refills | Status: AC

## 2025-01-03 MED ORDER — METRONIDAZOLE 0.75 % EX CREA: TOPICAL_CREAM | Freq: Two times a day (BID) | CUTANEOUS | 0 refills | Status: AC

## 2025-01-03 MED ORDER — HYDROCHLOROTHIAZIDE 25 MG PO TABS: 25.0000 mg | ORAL_TABLET | Freq: Every day | ORAL | 3 refills | Status: AC

## 2025-01-04 ENCOUNTER — Encounter: Payer: Self-pay | Admitting: Family Medicine

## 2025-01-04 DIAGNOSIS — E039 Hypothyroidism, unspecified: Secondary | ICD-10-CM

## 2025-01-04 LAB — COMPREHENSIVE METABOLIC PANEL WITH GFR
ALT: 24 U/L (ref 3–35)
AST: 15 U/L (ref 5–37)
Albumin: 4.1 g/dL (ref 3.5–5.2)
Alkaline Phosphatase: 79 U/L (ref 39–117)
BUN: 15 mg/dL (ref 6–23)
CO2: 31 meq/L (ref 19–32)
Calcium: 9.4 mg/dL (ref 8.4–10.5)
Chloride: 102 meq/L (ref 96–112)
Creatinine, Ser: 0.75 mg/dL (ref 0.40–1.20)
GFR: 97.44 mL/min
Glucose, Bld: 83 mg/dL (ref 70–99)
Potassium: 3.7 meq/L (ref 3.5–5.1)
Sodium: 141 meq/L (ref 135–145)
Total Bilirubin: 0.4 mg/dL (ref 0.2–1.2)
Total Protein: 6.9 g/dL (ref 6.0–8.3)

## 2025-01-04 LAB — LIPID PANEL
Cholesterol: 210 mg/dL — ABNORMAL HIGH (ref 28–200)
HDL: 41.4 mg/dL
LDL Cholesterol: 155 mg/dL — ABNORMAL HIGH (ref 10–99)
NonHDL: 168.85
Total CHOL/HDL Ratio: 5
Triglycerides: 70 mg/dL (ref 10.0–149.0)
VLDL: 14 mg/dL (ref 0.0–40.0)

## 2025-01-04 LAB — CBC
HCT: 42.7 % (ref 36.0–46.0)
Hemoglobin: 14.3 g/dL (ref 12.0–15.0)
MCHC: 33.4 g/dL (ref 30.0–36.0)
MCV: 89.6 fl (ref 78.0–100.0)
Platelets: 328 K/uL (ref 150.0–400.0)
RBC: 4.77 Mil/uL (ref 3.87–5.11)
RDW: 14.2 % (ref 11.5–15.5)
WBC: 8.9 K/uL (ref 4.0–10.5)

## 2025-01-04 LAB — SPECIMEN STATUS REPORT

## 2025-01-04 LAB — HEMOGLOBIN A1C: Hgb A1c MFr Bld: 5.9 % (ref 4.6–6.5)

## 2025-01-04 LAB — TSH: TSH: 8.18 u[IU]/mL — ABNORMAL HIGH (ref 0.35–5.50)

## 2025-01-04 LAB — ANTI-SMITH ANTIBODY: ENA SM Ab Ser-aCnc: <1 AI

## 2025-01-04 LAB — RHEUMATOID FACTOR: Rheumatoid fact SerPl-aCnc: <10 [IU]/mL

## 2025-01-04 LAB — C-REACTIVE PROTEIN: CRP: 0.8 mg/dL — ABNORMAL LOW (ref 1.0–20.0)

## 2025-01-04 LAB — SEDIMENTATION RATE: Sed Rate: 28 mm/h — ABNORMAL HIGH (ref 0–20)

## 2025-01-04 MED ORDER — LEVOTHYROXINE SODIUM 25 MCG PO TABS: 25.0000 ug | ORAL_TABLET | Freq: Every day | ORAL | 3 refills | Status: AC

## 2025-01-05 ENCOUNTER — Ambulatory Visit: Payer: Self-pay | Admitting: Family Medicine

## 2025-01-05 LAB — ANA+ENA+DNA/DS+ANTICH+CENTR
ANA Titer 1: NEGATIVE
Anti JO-1: <0.2 AI (ref 0.0–0.9)
Centromere Ab Screen: <0.2 AI (ref 0.0–0.9)
Chromatin Ab SerPl-aCnc: <0.2 AI (ref 0.0–0.9)
ENA RNP Ab: 0.5 AI (ref 0.0–0.9)
ENA SM Ab Ser-aCnc: <0.2 AI (ref 0.0–0.9)
ENA SSA (RO) Ab: <0.2 AI (ref 0.0–0.9)
ENA SSB (LA) Ab: <0.2 AI (ref 0.0–0.9)
Scleroderma (Scl-70) (ENA) Antibody, IgG: <0.2 AI (ref 0.0–0.9)
dsDNA Ab: 3 [IU]/mL (ref 0–9)

## 2026-01-09 ENCOUNTER — Encounter: Admitting: Family Medicine
